# Patient Record
Sex: Female | Born: 1975
Health system: Southern US, Community
[De-identification: ages and names within clinical notes are randomized; demographics above are authoritative.]

## PROBLEM LIST (undated history)

## (undated) DIAGNOSIS — I10 Essential (primary) hypertension: Secondary | ICD-10-CM

## (undated) DIAGNOSIS — F419 Anxiety disorder, unspecified: Secondary | ICD-10-CM

## (undated) DIAGNOSIS — E079 Disorder of thyroid, unspecified: Secondary | ICD-10-CM

## (undated) DIAGNOSIS — T7840XA Allergy, unspecified, initial encounter: Secondary | ICD-10-CM

## (undated) DIAGNOSIS — Z923 Personal history of irradiation: Secondary | ICD-10-CM

## (undated) DIAGNOSIS — Z8489 Family history of other specified conditions: Secondary | ICD-10-CM

## (undated) DIAGNOSIS — E785 Hyperlipidemia, unspecified: Secondary | ICD-10-CM

## (undated) DIAGNOSIS — C50919 Malignant neoplasm of unspecified site of unspecified female breast: Secondary | ICD-10-CM

## (undated) HISTORY — DX: Disorder of thyroid, unspecified: E07.9

## (undated) HISTORY — DX: Personal history of irradiation: Z92.3

## (undated) HISTORY — DX: Allergy, unspecified, initial encounter: T78.40XA

## (undated) HISTORY — DX: Essential (primary) hypertension: I10

## (undated) HISTORY — DX: Hyperlipidemia, unspecified: E78.5

---

## 1997-09-20 ENCOUNTER — Emergency Department (HOSPITAL_COMMUNITY): Admission: EM | Admit: 1997-09-20 | Discharge: 1997-09-20 | Payer: Self-pay | Admitting: Emergency Medicine

## 1998-01-20 ENCOUNTER — Other Ambulatory Visit: Admission: RE | Admit: 1998-01-20 | Discharge: 1998-01-20 | Payer: Self-pay | Admitting: *Deleted

## 1998-03-17 ENCOUNTER — Emergency Department (HOSPITAL_COMMUNITY): Admission: EM | Admit: 1998-03-17 | Discharge: 1998-03-17 | Payer: Self-pay | Admitting: Emergency Medicine

## 1998-11-20 ENCOUNTER — Encounter: Payer: Self-pay | Admitting: Emergency Medicine

## 1998-11-20 ENCOUNTER — Emergency Department (HOSPITAL_COMMUNITY): Admission: EM | Admit: 1998-11-20 | Discharge: 1998-11-20 | Payer: Self-pay | Admitting: Emergency Medicine

## 1999-06-23 ENCOUNTER — Emergency Department (HOSPITAL_COMMUNITY): Admission: EM | Admit: 1999-06-23 | Discharge: 1999-06-23 | Payer: Self-pay | Admitting: Internal Medicine

## 1999-06-23 ENCOUNTER — Encounter: Payer: Self-pay | Admitting: Internal Medicine

## 1999-06-24 ENCOUNTER — Emergency Department (HOSPITAL_COMMUNITY): Admission: EM | Admit: 1999-06-24 | Discharge: 1999-06-24 | Payer: Self-pay | Admitting: Emergency Medicine

## 1999-07-01 ENCOUNTER — Emergency Department (HOSPITAL_COMMUNITY): Admission: EM | Admit: 1999-07-01 | Discharge: 1999-07-01 | Payer: Self-pay | Admitting: Internal Medicine

## 2000-06-20 ENCOUNTER — Other Ambulatory Visit: Admission: RE | Admit: 2000-06-20 | Discharge: 2000-06-20 | Payer: Self-pay | Admitting: Family Medicine

## 2000-09-30 ENCOUNTER — Emergency Department (HOSPITAL_COMMUNITY): Admission: EM | Admit: 2000-09-30 | Discharge: 2000-09-30 | Payer: Self-pay | Admitting: Emergency Medicine

## 2000-12-24 ENCOUNTER — Emergency Department (HOSPITAL_COMMUNITY): Admission: EM | Admit: 2000-12-24 | Discharge: 2000-12-25 | Payer: Self-pay | Admitting: Emergency Medicine

## 2001-01-04 ENCOUNTER — Emergency Department (HOSPITAL_COMMUNITY): Admission: EM | Admit: 2001-01-04 | Discharge: 2001-01-04 | Payer: Self-pay | Admitting: Emergency Medicine

## 2002-04-21 ENCOUNTER — Emergency Department (HOSPITAL_COMMUNITY): Admission: EM | Admit: 2002-04-21 | Discharge: 2002-04-21 | Payer: Self-pay | Admitting: Emergency Medicine

## 2002-07-06 ENCOUNTER — Emergency Department (HOSPITAL_COMMUNITY): Admission: EM | Admit: 2002-07-06 | Discharge: 2002-07-06 | Payer: Self-pay | Admitting: Emergency Medicine

## 2002-10-15 ENCOUNTER — Other Ambulatory Visit: Admission: RE | Admit: 2002-10-15 | Discharge: 2002-10-15 | Payer: Self-pay | Admitting: Family Medicine

## 2007-04-23 HISTORY — PX: EYE SURGERY: SHX253

## 2011-08-16 ENCOUNTER — Ambulatory Visit: Payer: Self-pay | Admitting: Family Medicine

## 2011-08-16 DIAGNOSIS — Z0289 Encounter for other administrative examinations: Secondary | ICD-10-CM

## 2011-10-08 ENCOUNTER — Emergency Department (HOSPITAL_COMMUNITY)
Admission: EM | Admit: 2011-10-08 | Discharge: 2011-10-08 | Discharge status: Against Medical Advice | Payer: No Typology Code available for payment source | Attending: Emergency Medicine | Admitting: Emergency Medicine

## 2011-10-08 ENCOUNTER — Encounter (HOSPITAL_COMMUNITY): Payer: Self-pay | Admitting: *Deleted

## 2011-10-08 DIAGNOSIS — Z043 Encounter for examination and observation following other accident: Secondary | ICD-10-CM | POA: Insufficient documentation

## 2011-10-08 NOTE — ED Notes (Signed)
Pt reported back to triage and was visiting a family member.
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
## 2011-10-08 NOTE — ED Notes (Signed)
Called for pt x 2 and no response in waiting room.

## 2011-10-08 NOTE — ED Notes (Signed)
Pt reports that she has to leave due to prior commitment and will come back later. Pt says "I feel fine" and will come back if necessary. Told pt that room was being cleaned at this time. She decided to leave.

## 2011-10-09 ENCOUNTER — Ambulatory Visit (INDEPENDENT_AMBULATORY_CARE_PROVIDER_SITE_OTHER): Payer: 59 | Admitting: Family Medicine

## 2011-10-09 VITALS — BP 133/85 | HR 92 | Temp 97.9°F | Resp 18 | Ht 62.5 in | Wt 167.0 lb

## 2011-10-09 DIAGNOSIS — M545 Low back pain, unspecified: Secondary | ICD-10-CM

## 2011-10-09 DIAGNOSIS — M62838 Other muscle spasm: Secondary | ICD-10-CM

## 2011-10-09 MED ORDER — IBUPROFEN 800 MG PO TABS
800.0000 mg | ORAL_TABLET | Freq: Three times a day (TID) | ORAL | Status: AC | PRN
Start: 2011-10-09 — End: 2011-10-19

## 2011-10-09 MED ORDER — CYCLOBENZAPRINE HCL 10 MG PO TABS: 10.0000 mg | ORAL_TABLET | Freq: Three times a day (TID) | ORAL | Status: AC | PRN

## 2011-10-09 NOTE — Patient Instructions (Addendum)
Take the muscle relaxer for relief.  Heat, massage and just general movement are the best things for the stiffness.  Know that the muscle relaxer (FLexeril) can make you sleepy.  Don't drive with this medicne.  Take the Ibuprofen three times a day for the first 4-5 days.  After that, take it when you need it.    If you're still having problem in 2 weeks come back and see [place].
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
## 2012-06-05 ENCOUNTER — Ambulatory Visit (INDEPENDENT_AMBULATORY_CARE_PROVIDER_SITE_OTHER): Payer: 59 | Admitting: Physician Assistant

## 2012-06-05 VITALS — BP 124/82 | HR 102 | Temp 98.4°F | Resp 17 | Ht 62.5 in | Wt 176.0 lb

## 2012-06-05 DIAGNOSIS — J019 Acute sinusitis, unspecified: Secondary | ICD-10-CM

## 2012-06-05 DIAGNOSIS — H6980 Other specified disorders of Eustachian tube, unspecified ear: Secondary | ICD-10-CM

## 2012-06-05 DIAGNOSIS — H6990 Unspecified Eustachian tube disorder, unspecified ear: Secondary | ICD-10-CM

## 2012-06-05 MED ORDER — AMOXICILLIN-POT CLAVULANATE 875-125 MG PO TABS: 1.0000 | ORAL_TABLET | Freq: Two times a day (BID) | ORAL | Status: DC

## 2012-06-05 MED ORDER — IPRATROPIUM BROMIDE 0.03 % NA SOLN: 2.0000 | Freq: Two times a day (BID) | NASAL | Status: DC

## 2012-06-05 NOTE — Progress Notes (Signed)
  Subjective:    Patient ID: Ann Thornton, female    DOB: 1975-10-06, 37 y.o.   MRN: [id number]  HPI 37 year old female presents with 10 days of sinus tenderness, nasal congestion, chills, and ear pressure/fullness.  States symptoms started as a "cold" and have progressively gotten worse.  Has been taking Mucinex which has helped some.  Does admit to sinus pressure and an aching, full pain in her ears.  Fevers have resolved but she does still have chills.  Denies abdominal pain, nausea, vomiting, sore throat, or dizziness.  She does have slight sinus headache relieved by ibuprofen.  She does not smoke. No history of asthma or other lung diseases.       Review of Systems  Constitutional: Positive for chills. Negative for fever.  HENT: Positive for ear pain (fullness/pressure), congestion, rhinorrhea and sinus pressure. Negative for sore throat and neck pain.   Respiratory: Negative for cough, shortness of breath and wheezing.   Gastrointestinal: Negative for nausea, vomiting and abdominal pain.  Neurological: Positive for headaches (sinus). Negative for dizziness and light-headedness.       Objective:   Physical Exam  Constitutional: She is oriented to person, place, and time. She appears well-developed and well-nourished.  HENT:  Head: Normocephalic and atraumatic.  Right Ear: Hearing, tympanic membrane, external ear and ear canal normal.  Left Ear: Hearing, tympanic membrane, external ear and ear canal normal.  Nose: Right sinus exhibits maxillary sinus tenderness. Right sinus exhibits no frontal sinus tenderness. Left sinus exhibits maxillary sinus tenderness. Left sinus exhibits no frontal sinus tenderness.  Mouth/Throat: Uvula is midline, oropharynx is clear and moist and mucous membranes are normal. No oropharyngeal exudate.  Eyes: Conjunctivae are normal.  Neck: Normal range of motion. Neck supple.  Cardiovascular: Normal rate, regular rhythm and normal heart sounds.    Pulmonary/Chest: Effort normal and breath sounds normal.  Lymphadenopathy:    She has no cervical adenopathy.  Neurological: She is alert and oriented to person, place, and time.  Psychiatric: She has a normal mood and affect. Her behavior is normal. Judgment and thought content normal.          Assessment & Plan:  1. Sinusitis, acute - Plan: amoxicillin-clavulanate (AUGMENTIN) 875-125 MG per tablet, ipratropium (ATROVENT) 0.03 % nasal spray  -Augmentin 875 mg bid x 10 days  -Atrovent NS bid  -Continue Mucinex as directed. May use Sudafed x 2-3 days  -Follow up if symptoms worsen or fail to improve.  2. ETD (eustachian tube dysfunction)  -Zyrtec daily in the a.m to help with ETD

## 2012-06-20 ENCOUNTER — Ambulatory Visit (INDEPENDENT_AMBULATORY_CARE_PROVIDER_SITE_OTHER): Payer: 59 | Admitting: Family Medicine

## 2012-06-20 VITALS — BP 128/79 | HR 90 | Temp 98.2°F | Resp 18 | Ht 64.0 in | Wt 175.0 lb

## 2012-06-20 DIAGNOSIS — R05 Cough: Secondary | ICD-10-CM

## 2012-06-20 DIAGNOSIS — R059 Cough, unspecified: Secondary | ICD-10-CM

## 2012-06-20 MED ORDER — AZITHROMYCIN 250 MG PO TABS: ORAL_TABLET | ORAL | Status: DC

## 2012-06-20 MED ORDER — HYDROCODONE-HOMATROPINE 5-1.5 MG/5ML PO SYRP: 5.0000 mL | ORAL_SOLUTION | Freq: Three times a day (TID) | ORAL | Status: DC | PRN

## 2012-06-20 NOTE — Progress Notes (Signed)
[facility] [address] [place] [address] [id number]  Date:  06/20/2012   Name:  Ann Thornton   DOB:  [birth year]   MRN:  [id number]  PCP:  No primary provider on file.    Chief Complaint: Cough   History of Present Illness:  Ann Thornton is a 37 y.o. very pleasant female patient who presents with the following:  Here about 2 weeks ago with 10 days of sinus tenderness- treated with augmentin, atrovent NS, mucinex and zyrtec.   Her sinuses have gotten much better, but she now has a bad cough which is even worse at night.  The cough can be productive of brown colored mucus. She has noted some body aches, but thinks this is due to coughing so much No fever or chills She has had some post- tussive emesis but no other emesis.   She is generally healthy She does feel like she has been wheezing some  She has tried some "mucinex chest and cough", and some robitussin OTC, and nyquil at night She has her menses right now She is otherwise generally healthy  There is no problem list on file for this patient.   Past Medical History  Diagnosis Date  . Allergy     Past Surgical History  Procedure Laterality Date  . Eye surgery      History  Substance Use Topics  . Smoking status: Never Smoker   . Smokeless tobacco: Not on file  . Alcohol Use: Yes     Comment: on weekends    No family history on file.  No Known Allergies  Medication list has been reviewed and updated.  Current Outpatient Prescriptions on File Prior to Visit  Medication Sig Dispense Refill  . pseudoephedrine-guaifenesin (MUCINEX D) 60-600 MG per tablet Take 1 tablet by mouth every 12 (twelve) hours.      Marland Kitchen amoxicillin-clavulanate (AUGMENTIN) 875-125 MG per tablet Take 1 tablet by mouth 2 (two) times daily.  20 tablet  0  . ipratropium (ATROVENT) 0.03 % nasal spray Place 2 sprays into the nose 2 (two) times daily.  30 mL  1   No current facility-administered medications on  file prior to visit.    Review of Systems:  As per HPI- otherwise negative.   Physical Examination: Filed Vitals:   06/20/12 1400  BP: 128/79  Pulse: 109  Temp: 98.2 F (36.8 C)  Resp: 18   Filed Vitals:   06/20/12 1400  Height: 5\' 4"  (1.626 m)  Weight: 175 lb (79.379 kg)   Body mass index is 30.02 kg/(m^2). Ideal Body Weight: Weight in (lb) to have BMI = 25: 145.3  GEN: WDWN, NAD, Non-toxic, A & O x 3 HEENT: Atraumatic, Normocephalic. Neck supple. No masses, No LAD. Bilateral TM wnl, oropharynx normal.  PEERL,EOMI.   Ears and Nose: No external deformity. CV: RRR, No M/G/R. No JVD. No thrill. No extra heart sounds. PULM: CTA B, no wheezes, crackles, rhonchi. No retractions. No resp. distress. No accessory muscle use. EXTR: No c/c/e NEURO Normal gait.  PSYCH: Normally interactive. Conversant. Not depressed or anxious appearing.  Calm demeanor.    Assessment and Plan: Cough - Plan: azithromycin (ZITHROMAX) 250 MG tablet, HYDROcodone-homatropine (HYCODAN) 5-1.5 MG/5ML syrup  Treat for cough with azithromycin and hycodan PRN See patient instructions for more details.     Abbe Amsterdam, MD

## 2012-06-20 NOTE — Patient Instructions (Addendum)
Use the antibiotic as directed, and the cough syrup as needed.  Remember the cough syrup can cause drowsiness.    If you are not better in the next few days please let us know- Sooner if worse.   Buy some OTC probiotic supplement because we are using 2 different antibiotics in close succession

## 2012-10-12 ENCOUNTER — Ambulatory Visit: Payer: Self-pay | Admitting: Dietician

## 2012-10-22 ENCOUNTER — Ambulatory Visit: Payer: Self-pay | Admitting: *Deleted

## 2013-09-20 LAB — HM PAP SMEAR: HM Pap smear: NORMAL

## 2014-03-29 ENCOUNTER — Ambulatory Visit (INDEPENDENT_AMBULATORY_CARE_PROVIDER_SITE_OTHER): Payer: 59 | Admitting: Family Medicine

## 2014-03-29 VITALS — BP 146/84 | HR 102 | Temp 98.2°F | Resp 18 | Ht 63.0 in | Wt 173.8 lb

## 2014-03-29 DIAGNOSIS — R002 Palpitations: Secondary | ICD-10-CM

## 2014-03-29 DIAGNOSIS — R000 Tachycardia, unspecified: Secondary | ICD-10-CM

## 2014-03-29 DIAGNOSIS — IMO0001 Reserved for inherently not codable concepts without codable children: Secondary | ICD-10-CM

## 2014-03-29 DIAGNOSIS — R030 Elevated blood-pressure reading, without diagnosis of hypertension: Secondary | ICD-10-CM

## 2014-03-29 LAB — COMPLETE METABOLIC PANEL WITH GFR
ALT: 11 U/L (ref 0–35)
AST: 14 U/L (ref 0–37)
Albumin: 4.5 g/dL (ref 3.5–5.2)
Alkaline Phosphatase: 52 U/L (ref 39–117)
BILIRUBIN TOTAL: 0.6 mg/dL (ref 0.2–1.2)
BUN: 10 mg/dL (ref 6–23)
CALCIUM: 10.2 mg/dL (ref 8.4–10.5)
CHLORIDE: 101 meq/L (ref 96–112)
CO2: 25 mEq/L (ref 19–32)
Creat: 0.75 mg/dL (ref 0.50–1.10)
GFR, Est African American: >89 mL/min
GFR, Est Non African American: >89 mL/min
Glucose, Bld: 111 mg/dL — ABNORMAL HIGH (ref 70–99)
Potassium: 4 mEq/L (ref 3.5–5.3)
Sodium: 135 mEq/L (ref 135–145)
Total Protein: 8.6 g/dL — ABNORMAL HIGH (ref 6.0–8.3)

## 2014-03-29 LAB — POCT CBC
Granulocyte percent: 47.7 %G (ref 37–80)
HEMATOCRIT: 38 % (ref 37.7–47.9)
HEMOGLOBIN: 12.5 g/dL (ref 12.2–16.2)
LYMPH, POC: 3.2 (ref 0.6–3.4)
MCH, POC: 30.8 pg (ref 27–31.2)
MCHC: 32.9 g/dL (ref 31.8–35.4)
MCV: 39.6 fL — AB (ref 80–97)
MID (cbc): 1 — AB (ref 0–0.9)
MPV: 8.6 fL (ref 0–99.8)
POC GRANULOCYTE: 3.9 (ref 2–6.9)
POC LYMPH PERCENT: 39.6 %L (ref 10–50)
POC MID %: 12.7 %M — AB (ref 0–12)
Platelet Count, POC: 324 10*3/uL (ref 142–424)
RBC: 4.06 M/uL (ref 4.04–5.48)
RDW, POC: 12.1 %
WBC: 8.1 10*3/uL (ref 4.6–10.2)

## 2014-03-29 LAB — T4, FREE: FREE T4: 1.08 ng/dL (ref 0.80–1.80)

## 2014-03-29 LAB — T3, FREE: T3, Free: 3.3 pg/mL (ref 2.3–4.2)

## 2014-03-29 LAB — TSH: TSH: 3.089 u[IU]/mL (ref 0.350–4.500)

## 2014-03-29 NOTE — Patient Instructions (Addendum)
Watch your salt intake to help with your blood pressure.  I want to see you back in a month to recheck your blood pressure sooner if the heart racing does not get better.    You will work on deep breathing and trying to get these episodes (anxiety attacks) to stop  I will contact you with your lab results as soon as they are available.   If you have not heard from me in 2 weeks, please contact me.  The fastest way to get your results is to register for My Chart (see the instructions on the last page of this printout).
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
## 2014-05-03 NOTE — Progress Notes (Signed)
Reviewed documentation and EKG and agree w/ assessment and plan. Delman Cheadle, MD MPH

## 2014-06-02 ENCOUNTER — Ambulatory Visit (INDEPENDENT_AMBULATORY_CARE_PROVIDER_SITE_OTHER): Payer: 59 | Admitting: Physician Assistant

## 2014-06-02 VITALS — BP 140/92 | HR 95 | Temp 98.8°F | Resp 18 | Ht 63.0 in | Wt 180.8 lb

## 2014-06-02 DIAGNOSIS — G4700 Insomnia, unspecified: Secondary | ICD-10-CM

## 2014-06-02 DIAGNOSIS — F410 Panic disorder [episodic paroxysmal anxiety] without agoraphobia: Secondary | ICD-10-CM

## 2014-06-02 DIAGNOSIS — F419 Anxiety disorder, unspecified: Secondary | ICD-10-CM

## 2014-06-02 MED ORDER — CLONAZEPAM 0.5 MG PO TABS: 0.2500 mg | ORAL_TABLET | Freq: Two times a day (BID) | ORAL | Status: DC | PRN

## 2014-06-02 MED ORDER — ESCITALOPRAM OXALATE 10 MG PO TABS: 10.0000 mg | ORAL_TABLET | Freq: Every day | ORAL | Status: DC

## 2014-06-02 NOTE — Progress Notes (Signed)
   Subjective:    Patient ID: Ann Thornton, female    DOB: [birth year], 39 y.o.   MRN: [id number]  HPI Pt presents to clinic with continued palpitations and anxiety.  She is in a better place with accepting of her grandmother's disease process and it still makes her upset but not as bad as it was 2 months ago.  She is having problems daily with generalized anxiety and then she has about 2 panic attacks a day - 3x/week they wake her from sleep.  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  She is having no chest pain and she only has SOB when she is having a panic attack.  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  Her sleep is ok but she is having trouble getting to sleep.  She has noticed over the last several weeks she has increased her ETOH use at night to help with her sleeping.  She is up to 3 liquor shots each night.  She does not smoke and has decreased her use of marijuana because that increases her anxiety.  She has accepted the diagnosis of anxiety and now she really wants treatment for her symptoms.  Review of Systems  Cardiovascular: Positive for palpitations. Negative for chest pain.  Psychiatric/Behavioral: Positive for sleep disturbance. The patient is nervous/anxious.        Objective:   Physical Exam  Constitutional: She is oriented to person, place, and time. She appears well-developed and well-nourished.  BP 140/92 mmHg  Pulse 95  Temp(Src) 98.8 F (37.1 C) (Oral)  Resp 18  Ht 5\' 3"  (1.6 m)  Wt 180 lb 12.8 oz (82.01 kg)  BMI 32.04 kg/m2  SpO2 99%  LMP 06/02/2014   HENT:  Head: Normocephalic and atraumatic.  Right Ear: External ear normal.  Left Ear: External ear normal.  Pulmonary/Chest: Effort normal.  Neurological: She  is alert and oriented to person, place, and time.  Skin: Skin is warm and dry.  Psychiatric: She has a normal mood and affect. Her behavior is normal. Judgment and thought content normal.       Assessment & Plan:  Panic attacks - Plan: clonazePAM (KLONOPIN) 0.5 MG tablet  Insomnia - Plan: clonazePAM (KLONOPIN) 0.5 MG tablet  Anxiety - Plan: escitalopram (LEXAPRO) 10 MG tablet  We are going to start Lexapro and we discussed what to expect and when to expect it in regards to possible SE and expectations.  Due to her daily panic attacks we are going to start Klonopin bid for now and then we will decrease to prn in 2-3 days. She will decrease her EOTH intake and she will recheck with me in 1 month.  Windell Hummingbird PA-C  [facility] 06/02/2014 2:50 PM

## 2014-06-02 NOTE — Patient Instructions (Addendum)
See me in 1 month  [facility] Policy for Prescribing Controlled Substances (Revised 02/2012) 1. Prescriptions for controlled substances will be filled by ONE provider at [facility] with whom you have established and developed a plan for your care, including follow-up. 2. You are encouraged to schedule an appointment with your prescriber at our appointment center for follow-up visits whenever possible. 3. If you request a prescription for the controlled substance while at [facility] for an acute problem (with someone other than your regular prescriber), you MAY be given a ONE-TIME prescription for a 30-day supply of the controlled substance, to allow time for you to return to see your regular prescriber for additional prescriptions.  Sign up for my chart so you can contact me

## 2014-06-05 ENCOUNTER — Encounter: Payer: Self-pay | Admitting: Physician Assistant

## 2015-01-24 ENCOUNTER — Encounter (HOSPITAL_COMMUNITY): Payer: Self-pay | Admitting: Emergency Medicine

## 2015-01-24 ENCOUNTER — Emergency Department (HOSPITAL_COMMUNITY): Payer: No Typology Code available for payment source

## 2015-01-24 ENCOUNTER — Emergency Department (HOSPITAL_COMMUNITY)
Admission: EM | Admit: 2015-01-24 | Discharge: 2015-01-24 | Disposition: A | Discharge status: Home / Self Care | Payer: No Typology Code available for payment source | Attending: Emergency Medicine | Admitting: Emergency Medicine

## 2015-01-24 DIAGNOSIS — Y9389 Activity, other specified: Secondary | ICD-10-CM | POA: Insufficient documentation

## 2015-01-24 DIAGNOSIS — S60811A Abrasion of right wrist, initial encounter: Secondary | ICD-10-CM | POA: Diagnosis not present

## 2015-01-24 DIAGNOSIS — Z791 Long term (current) use of non-steroidal anti-inflammatories (NSAID): Secondary | ICD-10-CM | POA: Insufficient documentation

## 2015-01-24 DIAGNOSIS — T2112XA Burn of first degree of abdominal wall, initial encounter: Secondary | ICD-10-CM

## 2015-01-24 DIAGNOSIS — Z23 Encounter for immunization: Secondary | ICD-10-CM | POA: Diagnosis not present

## 2015-01-24 DIAGNOSIS — Y998 Other external cause status: Secondary | ICD-10-CM | POA: Insufficient documentation

## 2015-01-24 DIAGNOSIS — S63501A Unspecified sprain of right wrist, initial encounter: Secondary | ICD-10-CM | POA: Insufficient documentation

## 2015-01-24 DIAGNOSIS — X19XXXA Contact with other heat and hot substances, initial encounter: Secondary | ICD-10-CM | POA: Diagnosis not present

## 2015-01-24 DIAGNOSIS — Z3202 Encounter for pregnancy test, result negative: Secondary | ICD-10-CM | POA: Diagnosis not present

## 2015-01-24 DIAGNOSIS — Z79899 Other long term (current) drug therapy: Secondary | ICD-10-CM | POA: Insufficient documentation

## 2015-01-24 DIAGNOSIS — T2122XA Burn of second degree of abdominal wall, initial encounter: Secondary | ICD-10-CM | POA: Diagnosis not present

## 2015-01-24 DIAGNOSIS — S6991XA Unspecified injury of right wrist, hand and finger(s), initial encounter: Secondary | ICD-10-CM | POA: Diagnosis present

## 2015-01-24 DIAGNOSIS — Y92410 Unspecified street and highway as the place of occurrence of the external cause: Secondary | ICD-10-CM | POA: Insufficient documentation

## 2015-01-24 LAB — POC URINE PREG, ED: Preg Test, Ur: NEGATIVE

## 2015-01-24 MED ORDER — METHOCARBAMOL 500 MG PO TABS: 500.0000 mg | ORAL_TABLET | Freq: Two times a day (BID) | ORAL | Status: DC

## 2015-01-24 MED ORDER — BACITRACIN ZINC 500 UNIT/GM EX OINT: 1.0000 "application " | TOPICAL_OINTMENT | Freq: Two times a day (BID) | CUTANEOUS | Status: DC

## 2015-01-24 MED ORDER — BACITRACIN ZINC 500 UNIT/GM EX OINT
TOPICAL_OINTMENT | CUTANEOUS | Status: AC
  Administered 2015-01-24: 23:00:00
  Filled 2015-01-24: qty 0.9

## 2015-01-24 MED ORDER — NAPROXEN 500 MG PO TABS: 500.0000 mg | ORAL_TABLET | Freq: Two times a day (BID) | ORAL | Status: DC

## 2015-01-24 MED ORDER — TETANUS-DIPHTH-ACELL PERTUSSIS 5-2.5-18.5 LF-MCG/0.5 IM SUSP
0.5000 mL | Freq: Once | INTRAMUSCULAR | Status: AC
  Administered 2015-01-24: 0.5 mL via INTRAMUSCULAR
  Filled 2015-01-24: qty 0.5
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
## 2015-01-30 ENCOUNTER — Other Ambulatory Visit: Payer: Self-pay | Admitting: Internal Medicine

## 2015-01-31 NOTE — Telephone Encounter (Signed)
I gave her #5 because she was supposed to f/u with me in the spring.

## 2015-01-31 NOTE — Telephone Encounter (Signed)
Faxed RF and notified pt of #5 RF and need for f/up (on VM).

## 2015-03-23 ENCOUNTER — Ambulatory Visit (INDEPENDENT_AMBULATORY_CARE_PROVIDER_SITE_OTHER): Payer: 59 | Admitting: Physician Assistant

## 2015-03-23 VITALS — BP 180/100 | HR 98 | Temp 98.4°F | Resp 16 | Ht 63.5 in | Wt 198.6 lb

## 2015-03-23 DIAGNOSIS — F419 Anxiety disorder, unspecified: Secondary | ICD-10-CM | POA: Diagnosis not present

## 2015-03-23 DIAGNOSIS — R030 Elevated blood-pressure reading, without diagnosis of hypertension: Secondary | ICD-10-CM

## 2015-03-23 DIAGNOSIS — IMO0001 Reserved for inherently not codable concepts without codable children: Secondary | ICD-10-CM

## 2015-03-23 DIAGNOSIS — G4700 Insomnia, unspecified: Secondary | ICD-10-CM

## 2015-03-23 DIAGNOSIS — I10 Essential (primary) hypertension: Secondary | ICD-10-CM | POA: Diagnosis not present

## 2015-03-23 MED ORDER — TRAZODONE HCL 50 MG PO TABS: 25.0000 mg | ORAL_TABLET | Freq: Every evening | ORAL | Status: DC | PRN

## 2015-03-23 MED ORDER — AMLODIPINE BESYLATE 10 MG PO TABS: 10.0000 mg | ORAL_TABLET | Freq: Every day | ORAL | Status: DC

## 2015-03-23 MED ORDER — LOSARTAN POTASSIUM-HCTZ 50-12.5 MG PO TABS: 1.0000 | ORAL_TABLET | Freq: Every day | ORAL | Status: DC

## 2015-03-23 MED ORDER — CLONAZEPAM 0.5 MG PO TABS: 0.5000 mg | ORAL_TABLET | Freq: Every day | ORAL | Status: DC | PRN
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
## 2015-03-23 NOTE — Patient Instructions (Signed)
For the trazodone  - start with 1/2 pill for the 1st 4 days - please increase the dosage by 1/2 pill every 4-5 days until you sleep well, reach 150mg  (that is 3 pills) or get side effects.  Let me know which dose works well for you and your refill will be at that dose.

## 2015-03-24 LAB — COMPLETE METABOLIC PANEL WITH GFR
ALBUMIN: 4.4 g/dL (ref 3.6–5.1)
ALK PHOS: 57 U/L (ref 33–115)
ALT: 17 U/L (ref 6–29)
AST: 22 U/L (ref 10–30)
BUN: 9 mg/dL (ref 7–25)
CHLORIDE: 99 mmol/L (ref 98–110)
CO2: 28 mmol/L (ref 20–31)
Calcium: 9.8 mg/dL (ref 8.6–10.2)
Creat: 0.67 mg/dL (ref 0.50–1.10)
GFR, Est African American: >89 mL/min (ref >=60)
GLUCOSE: 102 mg/dL — AB (ref 65–99)
POTASSIUM: 3.6 mmol/L (ref 3.5–5.3)
SODIUM: 136 mmol/L (ref 135–146)
Total Bilirubin: 0.4 mg/dL (ref 0.2–1.2)
Total Protein: 8.2 g/dL — ABNORMAL HIGH (ref 6.1–8.1)

## 2015-03-24 LAB — TSH: TSH: 8.175 u[IU]/mL — ABNORMAL HIGH (ref 0.350–4.500)

## 2015-03-28 ENCOUNTER — Encounter: Payer: Self-pay | Admitting: Physician Assistant

## 2015-04-08 ENCOUNTER — Ambulatory Visit (INDEPENDENT_AMBULATORY_CARE_PROVIDER_SITE_OTHER): Payer: 59 | Admitting: Physician Assistant

## 2015-04-08 VITALS — BP 132/82 | HR 116 | Temp 98.6°F | Resp 17 | Ht 63.0 in | Wt 191.0 lb

## 2015-04-08 DIAGNOSIS — I10 Essential (primary) hypertension: Secondary | ICD-10-CM | POA: Diagnosis not present

## 2015-04-08 DIAGNOSIS — G4700 Insomnia, unspecified: Secondary | ICD-10-CM | POA: Diagnosis not present

## 2015-04-08 DIAGNOSIS — R7989 Other specified abnormal findings of blood chemistry: Secondary | ICD-10-CM

## 2015-04-08 LAB — T4, FREE: FREE T4: 0.98 ng/dL (ref 0.80–1.80)

## 2015-04-08 LAB — TSH: TSH: 1.429 u[IU]/mL (ref 0.350–4.500)

## 2015-04-08 MED ORDER — AMLODIPINE BESYLATE 10 MG PO TABS: 10.0000 mg | ORAL_TABLET | Freq: Every day | ORAL | Status: DC

## 2015-04-08 MED ORDER — TRAZODONE HCL 50 MG PO TABS: 25.0000 mg | ORAL_TABLET | Freq: Every evening | ORAL | Status: DC | PRN

## 2015-04-08 NOTE — Progress Notes (Signed)
   Ann Thornton  MRN: [id number] DOB: 07-18-1975  Subjective:  Pt presents to clinic for recheck of her BP and recheck of TSH.  She has felt much better since starting the HTN medication.  She is tolerating the medication ok and she has been checking her BP at home and it is much better.  She has also been using the trazodone at night and that is helping her sleep - she is only able to take 25mg  on nights that she works but on the weekends she will take 50mg .  Patient Active Problem List   Diagnosis Date Noted  . Essential hypertension 03/23/2015  . Anxiety disorder 06/02/2014  . Panic attacks 06/02/2014    Current Outpatient Prescriptions on File Prior to Visit  Medication Sig Dispense Refill  . clonazePAM (KLONOPIN) 0.5 MG tablet Take 1 tablet (0.5 mg total) by mouth daily as needed for anxiety. 30 tablet 0   No current facility-administered medications on file prior to visit.    No Known Allergies  Review of Systems  Constitutional: Negative for fever and chills.  Respiratory: Negative for shortness of breath.   Cardiovascular: Negative for chest pain, palpitations and leg swelling.   Objective:  BP 132/82 mmHg  Pulse 116  Temp(Src) 98.6 F (37 C) (Oral)  Resp 17  Ht 5\' 3"  (1.6 m)  Wt 191 lb (86.637 kg)  BMI 33.84 kg/m2  SpO2 97%  LMP 03/04/2015  Physical Exam  Constitutional: She is oriented to person, place, and time and well-developed, well-nourished, and in no distress.  HENT:  Head: Normocephalic and atraumatic.  Right Ear: Hearing and external ear normal.  Left Ear: Hearing and external ear normal.  Eyes: Conjunctivae are normal.  Neck: Normal range of motion. No thyroid mass and no thyromegaly present.  Cardiovascular: Normal rate, regular rhythm and normal heart sounds.   No murmur heard. Pulmonary/Chest: Effort normal and breath sounds normal.  Musculoskeletal:       Right lower leg: She exhibits no edema.       Left lower leg: She exhibits no edema.   Neurological: She is alert and oriented to person, place, and time. Gait normal.  Skin: Skin is warm and dry.  Psychiatric: Mood, memory, affect and judgment normal.  Vitals reviewed.   Assessment and Plan :  Abnormal TSH - Plan: TSH, T4, Free  Essential hypertension - Plan: amLODipine (NORVASC) 10 MG tablet, Care order/instruction - controlled on medication  Insomnia - Plan: traZODone (DESYREL) 50 MG tablet   Continue current medication - recheck labs today and treat according to the results.  F/u in 3 months (maybe be 2  Months if start on thyroid medication)  Windell Hummingbird PA-C  [facility] 04/08/2015 10:35 AM

## 2015-04-08 NOTE — Patient Instructions (Signed)
We will check your labs and then I will contact you once the results are available.

## 2015-06-19 ENCOUNTER — Other Ambulatory Visit: Payer: Self-pay

## 2015-06-19 DIAGNOSIS — G4700 Insomnia, unspecified: Secondary | ICD-10-CM

## 2015-06-19 DIAGNOSIS — I10 Essential (primary) hypertension: Secondary | ICD-10-CM

## 2015-06-19 MED ORDER — AMLODIPINE BESYLATE 10 MG PO TABS: 10.0000 mg | ORAL_TABLET | Freq: Every day | ORAL | Status: DC

## 2015-06-19 MED ORDER — TRAZODONE HCL 50 MG PO TABS: 25.0000 mg | ORAL_TABLET | Freq: Every evening | ORAL | Status: DC | PRN

## 2015-06-25 ENCOUNTER — Encounter: Payer: Self-pay | Admitting: Physician Assistant

## 2015-06-26 ENCOUNTER — Other Ambulatory Visit: Payer: Self-pay | Admitting: Family Medicine

## 2015-08-02 ENCOUNTER — Other Ambulatory Visit: Payer: Self-pay | Admitting: Physician Assistant

## 2015-08-21 ENCOUNTER — Other Ambulatory Visit: Payer: Self-pay | Admitting: Family Medicine

## 2015-08-22 NOTE — Telephone Encounter (Signed)
Needs office visit for refills. 1 more month called in

## 2015-11-09 ENCOUNTER — Encounter: Payer: 59 | Admitting: Nurse Practitioner

## 2015-11-09 ENCOUNTER — Encounter: Payer: Self-pay | Admitting: Nurse Practitioner

## 2015-11-14 ENCOUNTER — Other Ambulatory Visit: Payer: Self-pay | Admitting: Family Medicine

## 2015-11-14 DIAGNOSIS — F419 Anxiety disorder, unspecified: Secondary | ICD-10-CM

## 2015-11-16 NOTE — Telephone Encounter (Signed)
Patient is due for a follow up for refills?  What is his plan?

## 2015-11-18 NOTE — Progress Notes (Signed)
This encounter was created in error - please disregard.

## 2015-11-20 NOTE — Telephone Encounter (Signed)
Spoke to pt who stated that she would like to get in but has a balance she is trying to pay off. She wondered if she can make an appt while paying it off and stated she can make a payment today. I put her through to Billing and advised I will send in a 30 day supply of her meds to give her time to get in.

## 2015-11-20 NOTE — Telephone Encounter (Signed)
Pt called me back and stated that she should be able to pay off the remaining $100 balance next week. She will plan to come in for f/up then in Aug with in the month. Pt was wondering if she could possibly also get a RF of clonazepam to hold her until then? She reported that she only takes these as needed and has been taking about 2 per week usually. She only has 3 tabs left and would like a few more so she will not be out before she can get in. Sarah, please advise.

## 2015-11-20 NOTE — Addendum Note (Signed)
Addended by: Elwyn Reach A on: 11/20/2015 10:55 AM   Modules accepted: Orders

## 2015-11-23 MED ORDER — CLONAZEPAM 0.5 MG PO TABS: 0.5000 mg | ORAL_TABLET | Freq: Every day | ORAL | 0 refills | Status: DC | PRN

## 2015-11-23 NOTE — Addendum Note (Signed)
Addended by: Mancel Bale on: 11/23/2015 03:34 PM   Modules accepted: Orders

## 2015-11-23 NOTE — Telephone Encounter (Signed)
Done

## 2015-11-23 NOTE — Telephone Encounter (Signed)
Verified w/pt the correct pharm and called in to [facility].

## 2015-11-24 ENCOUNTER — Encounter (HOSPITAL_COMMUNITY): Payer: Self-pay | Admitting: Emergency Medicine

## 2015-11-24 ENCOUNTER — Emergency Department (HOSPITAL_COMMUNITY)
Admission: EM | Admit: 2015-11-24 | Discharge: 2015-11-24 | Disposition: A | Discharge status: Home / Self Care | Payer: 59 | Attending: Emergency Medicine | Admitting: Emergency Medicine

## 2015-11-24 DIAGNOSIS — E860 Dehydration: Secondary | ICD-10-CM | POA: Insufficient documentation

## 2015-11-24 DIAGNOSIS — R002 Palpitations: Secondary | ICD-10-CM | POA: Insufficient documentation

## 2015-11-24 DIAGNOSIS — E876 Hypokalemia: Secondary | ICD-10-CM

## 2015-11-24 DIAGNOSIS — Z79899 Other long term (current) drug therapy: Secondary | ICD-10-CM | POA: Insufficient documentation

## 2015-11-24 DIAGNOSIS — R531 Weakness: Secondary | ICD-10-CM | POA: Diagnosis present

## 2015-11-24 DIAGNOSIS — I10 Essential (primary) hypertension: Secondary | ICD-10-CM | POA: Diagnosis not present

## 2015-11-24 HISTORY — DX: Anxiety disorder, unspecified: F41.9

## 2015-11-24 LAB — URINALYSIS, ROUTINE W REFLEX MICROSCOPIC
Bilirubin Urine: NEGATIVE
GLUCOSE, UA: NEGATIVE mg/dL
Ketones, ur: 15 mg/dL — AB
Leukocytes, UA: NEGATIVE
Nitrite: NEGATIVE
Protein, ur: NEGATIVE mg/dL
SPECIFIC GRAVITY, URINE: 1.007 (ref 1.005–1.030)
pH: 6 (ref 5.0–8.0)

## 2015-11-24 LAB — I-STAT TROPONIN, ED: TROPONIN I, POC: 0.01 ng/mL (ref 0.00–0.08)

## 2015-11-24 LAB — CBC
HCT: 36.7 % (ref 36.0–46.0)
HEMOGLOBIN: 12.7 g/dL (ref 12.0–15.0)
MCH: 31.4 pg (ref 26.0–34.0)
MCHC: 34.6 g/dL (ref 30.0–36.0)
MCV: 90.8 fL (ref 78.0–100.0)
Platelets: 394 10*3/uL (ref 150–400)
RBC: 4.04 MIL/uL (ref 3.87–5.11)
RDW: 12 % (ref 11.5–15.5)
WBC: 7.4 10*3/uL (ref 4.0–10.5)

## 2015-11-24 LAB — I-STAT BETA HCG BLOOD, ED (MC, WL, AP ONLY)

## 2015-11-24 LAB — BASIC METABOLIC PANEL
ANION GAP: 11 (ref 5–15)
BUN: 10 mg/dL (ref 6–20)
CHLORIDE: 98 mmol/L — AB (ref 101–111)
CO2: 25 mmol/L (ref 22–32)
Calcium: 9.5 mg/dL (ref 8.9–10.3)
Creatinine, Ser: 0.53 mg/dL (ref 0.44–1.00)
GFR calc non Af Amer: >60 mL/min (ref >60)
Glucose, Bld: 116 mg/dL — ABNORMAL HIGH (ref 65–99)
POTASSIUM: 3 mmol/L — AB (ref 3.5–5.1)
Sodium: 134 mmol/L — ABNORMAL LOW (ref 135–145)

## 2015-11-24 LAB — URINE MICROSCOPIC-ADD ON

## 2015-11-24 MED ORDER — SODIUM CHLORIDE 0.9 % IV BOLUS (SEPSIS)
1000.0000 mL | Freq: Once | INTRAVENOUS | Status: AC
  Administered 2015-11-24: 1000 mL via INTRAVENOUS

## 2015-11-24 MED ORDER — POTASSIUM CHLORIDE CRYS ER 20 MEQ PO TBCR
40.0000 meq | EXTENDED_RELEASE_TABLET | Freq: Once | ORAL | Status: AC
  Administered 2015-11-24: 40 meq via ORAL
  Filled 2015-11-24: qty 2

## 2015-11-24 MED ORDER — POTASSIUM CHLORIDE ER 10 MEQ PO TBCR: 10.0000 meq | EXTENDED_RELEASE_TABLET | Freq: Two times a day (BID) | ORAL | 0 refills | Status: DC

## 2015-11-24 NOTE — ED Triage Notes (Addendum)
Pt states on Wednesday she began feeling gradual onset nausea without nausea. Pt states this comes and goes. Pt states that when she is not wearing her glasses and looking at her multiple computer screens (she works from home) she sees black spots. Pt states that on Wed she began having CP, but is not currently experiencing this. Pt is tachycardic at time of assessment, but states she has a hx of anxiety and she feels anxious. Pt states she is feeling "uneasy and weak". Pt takes klonopin prn for anxiety. Pt states she last took it on Wed.

## 2015-11-24 NOTE — ED Provider Notes (Signed)
[facility] DEPT Provider Note   CSN: [id number] Arrival date & time: 11/24/15  1647  First Provider Contact:  None       History   Chief Complaint Chief Complaint  Patient presents with  . seeing black spots  . Tachycardia  . Weakness    HPI Ann Thornton Lanny Hurst is a 40 y.o. female.  HPI   Chest was hurting today, like heart racing/palpitations. Have anxiety, take klonopin. Last one was on Wednesday, and didn't feel well and came over here.  Seeing black spots sometimes when looking at computer and working, more often then rest of the week. Floating around in both eyes, worse in right eye. Looking from screen to screen at work.  Not missing an area in field of vision. No pain.    Now feeling much better.  Not having palpitations any more.  No visual changes.  Palpitations come and go, happened around 130PM, then felt better then 330 came back and came ot ED. Felt discomfort like palpitations in chest but no chest pain. Had some sensation in shoulder with palpitations as well. No shortness of breath at the time.  No leg pain or swelling. No hx of DVT/PE, not on birth control, no recent surgeries. No long trips.    Past Medical History:  Diagnosis Date  . Allergy   . Anxiety   . High blood pressure     Patient Active Problem List   Diagnosis Date Noted  . Insomnia 04/08/2015  . Essential hypertension 03/23/2015  . Anxiety disorder 06/02/2014  . Panic attacks 06/02/2014    Past Surgical History:  Procedure Laterality Date  . EYE SURGERY Right 2009   Tightened muscle of eye    OB History    No data available       Home Medications    Prior to Admission medications   Medication Sig Start Date End Date Taking? Authorizing Provider  amLODipine (NORVASC) 10 MG tablet Take 1 tablet by mouth  daily Patient taking differently: Take 10 mg by mouth daily 11/20/15  Yes Wardell Honour, MD  clonazePAM (KLONOPIN) 0.5 MG tablet Take 1 tablet (0.5 mg total) by mouth daily as  needed for anxiety. 11/23/15  Yes Mancel Bale, PA-C  naproxen sodium (ANAPROX) 220 MG tablet Take 440 mg by mouth every 12 (twelve) hours as needed (pain).   Yes Historical Provider, MD  potassium chloride (K-DUR) 10 MEQ tablet Take 1 tablet (10 mEq total) by mouth 2 (two) times daily. 11/24/15 11/26/15  Gareth Morgan, MD  traZODone (DESYREL) 50 MG tablet Take 1/2 to 1 tablet by  mouth at bedtime as needed  for sleep Patient not taking: Reported on 11/24/2015 11/20/15   Wardell Honour, MD    Family History Family History  Problem Relation Age of Onset  . Hypertension Mother   . Thyroid disease Sister     Social History Social History  Substance Use Topics  . Smoking status: Never Smoker  . Smokeless tobacco: Never Used  . Alcohol use 4.2 oz/week    7 Standard drinks or equivalent per week     Comment: Has 1 cocktail every day     Allergies   Review of patient's allergies indicates no known allergies.   Review of Systems Review of Systems  Constitutional: Positive for appetite change. Negative for fever.  HENT: Negative for sore throat.   Eyes: Positive for visual disturbance.  Respiratory: Negative for cough and shortness of breath.   Cardiovascular: Positive  for palpitations. Negative for chest pain (more like heart racing) and leg swelling.  Gastrointestinal: Negative for abdominal pain, nausea and vomiting.  Genitourinary: Negative for difficulty urinating.  Musculoskeletal: Negative for back pain and neck pain.  Skin: Negative for rash.  Neurological: Negative for dizziness, syncope, facial asymmetry, weakness, numbness and headaches.     Physical Exam Updated Vital Signs BP 142/97   Pulse 100   Temp 98.9 F (37.2 C)   Resp 17   LMP 11/16/2015   SpO2 99%   Physical Exam  Constitutional: She is oriented to person, place, and time. She appears well-developed and well-nourished. No distress.  HENT:  Head: Normocephalic and atraumatic.  Eyes: Conjunctivae and EOM  are normal.  No visual field deficits  Neck: Normal range of motion.  Cardiovascular: Regular rhythm, normal heart sounds and intact distal pulses.  Tachycardia present.  Exam reveals no gallop and no friction rub.   No murmur heard. Pulmonary/Chest: Effort normal and breath sounds normal. No respiratory distress. She has no wheezes. She has no rales.  Abdominal: Soft. She exhibits no distension. There is no tenderness. There is no guarding.  Musculoskeletal: She exhibits no edema or tenderness.  Neurological: She is alert and oriented to person, place, and time. She has normal strength. No cranial nerve deficit or sensory deficit. Coordination and gait normal. GCS eye subscore is 4. GCS verbal subscore is 5. GCS motor subscore is 6.  Skin: Skin is warm and dry. No rash noted. She is not diaphoretic. No erythema.  Nursing note and vitals reviewed.    ED Treatments / Results  Labs (all labs ordered are listed, but only abnormal results are displayed) Labs Reviewed  BASIC METABOLIC PANEL - Abnormal; Notable for the following:       Result Value   Sodium 134 (*)    Potassium 3.0 (*)    Chloride 98 (*)    Glucose, Bld 116 (*)    All other components within normal limits  URINALYSIS, ROUTINE W REFLEX MICROSCOPIC (NOT AT [facility]) - Abnormal; Notable for the following:    Hgb urine dipstick TRACE (*)    Ketones, ur 15 (*)    All other components within normal limits  URINE MICROSCOPIC-ADD ON - Abnormal; Notable for the following:    Squamous Epithelial / LPF 0-5 (*)    Bacteria, UA FEW (*)    All other components within normal limits  CBC  I-STAT BETA HCG BLOOD, ED ([facility], [facility], [facility] ONLY)  I-STAT TROPOININ, ED    EKG  EKG Interpretation  Date/Time:  Friday November 24 2015 17:19:52 EDT Ventricular Rate:  129 PR Interval:    QRS Duration: 83 QT Interval:  306 QTC Calculation: 449 R Axis:   39 Text Interpretation:  Sinus tachycardia Nonspecific T abnormalities, diffuse leads No previous  ECGs available Confirmed by [facility] MD, Felicia Bloomquist ([id number]) on 11/24/2015 6:54:23 PM       Radiology No results found.  Procedures Procedures (including critical care time)  Medications Ordered in ED Medications  sodium chloride 0.9 % bolus 1,000 mL (0 mLs Intravenous Stopped 11/24/15 2005)  potassium chloride SA (K-DUR,KLOR-CON) CR tablet 40 mEq (40 mEq Oral Given 11/24/15 2112)     Initial Impression / Assessment and Plan / ED Course  I have reviewed the triage vital signs and the nursing notes.  Pertinent labs & imaging results that were available during my care of the patient were reviewed by me and considered in my medical decision making (see  chart for details).  Clinical Course   40yo female with history of anxiety and high blood pressure presents with concern for palpitations.  Describes visual changes/floaters bilaterally when staring at computer screens/switching screens at work, however no visual field deficits by hx or exam, no double vision, normal EOM, no other neurologic symptoms, and doubt CVA/TIA/retinal detachment by hx and physical exam.  Denies overt chest pain but reports palpitations. EKG shows sinus tachycardia and troponin negative. Doubt ACS given duration since pt had symptoms with negative troponin.  Pt without dyspnea, no tachypnea, no PE risk factors, and doubt PE.  She has tachycardia noted on several office and ED visits.    Patient reports poor po intake and appetite over last several days, and suspect dehydration as etiology of palpitations.  Potassium mildly decreased at 3 and pt given 66meq in ED and 32meq BID for 2 days. Recommend continued close PCP follow up. Symptoms resolved after IV fluids in the ED. Patient discharged in stable condition with understanding of reasons to return.    Final Clinical Impressions(s) / ED Diagnoses   Final diagnoses:  Dehydration  Palpitations  Hypokalemia    New Prescriptions Discharge Medication List as of 11/24/2015   9:31 PM       Gareth Morgan, MD 11/25/15 1245

## 2015-11-29 ENCOUNTER — Other Ambulatory Visit: Payer: Self-pay | Admitting: Family Medicine

## 2015-12-16 ENCOUNTER — Ambulatory Visit (INDEPENDENT_AMBULATORY_CARE_PROVIDER_SITE_OTHER): Payer: 59 | Admitting: Family Medicine

## 2015-12-16 VITALS — BP 136/100 | HR 102 | Temp 98.9°F | Ht 63.0 in | Wt 190.2 lb

## 2015-12-16 DIAGNOSIS — Z136 Encounter for screening for cardiovascular disorders: Secondary | ICD-10-CM

## 2015-12-16 DIAGNOSIS — R42 Dizziness and giddiness: Secondary | ICD-10-CM

## 2015-12-16 DIAGNOSIS — R5383 Other fatigue: Secondary | ICD-10-CM | POA: Diagnosis not present

## 2015-12-16 DIAGNOSIS — I10 Essential (primary) hypertension: Secondary | ICD-10-CM | POA: Diagnosis not present

## 2015-12-16 LAB — CBC WITH DIFFERENTIAL/PLATELET
BASOS PCT: 0 %
Basophils Absolute: 0 cells/uL (ref 0–200)
Eosinophils Absolute: 61 cells/uL (ref 15–500)
Eosinophils Relative: 1 %
HEMATOCRIT: 35.4 % (ref 35.0–45.0)
HEMOGLOBIN: 12 g/dL (ref 11.7–15.5)
LYMPHS ABS: 1952 {cells}/uL (ref 850–3900)
Lymphocytes Relative: 32 %
MCH: 31.3 pg (ref 27.0–33.0)
MCHC: 33.9 g/dL (ref 32.0–36.0)
MCV: 92.2 fL (ref 80.0–100.0)
MONO ABS: 427 {cells}/uL (ref 200–950)
MPV: 10.5 fL (ref 7.5–12.5)
Monocytes Relative: 7 %
NEUTROS ABS: 3660 {cells}/uL (ref 1500–7800)
NEUTROS PCT: 60 %
Platelets: 370 10*3/uL (ref 140–400)
RBC: 3.84 MIL/uL (ref 3.80–5.10)
RDW: 12.4 % (ref 11.0–15.0)
WBC: 6.1 10*3/uL (ref 3.8–10.8)

## 2015-12-16 LAB — LIPID PANEL
CHOLESTEROL: 256 mg/dL — AB (ref 125–200)
HDL: 55 mg/dL (ref >=46)
LDL CALC: 177 mg/dL — AB (ref <130)
TRIGLYCERIDES: 121 mg/dL (ref <150)
Total CHOL/HDL Ratio: 4.7 Ratio (ref <=5.0)
VLDL: 24 mg/dL (ref <30)

## 2015-12-16 LAB — COMPLETE METABOLIC PANEL WITH GFR
ALT: 18 U/L (ref 6–29)
AST: 18 U/L (ref 10–30)
Albumin: 4.7 g/dL (ref 3.6–5.1)
Alkaline Phosphatase: 54 U/L (ref 33–115)
BUN: 8 mg/dL (ref 7–25)
CALCIUM: 9.6 mg/dL (ref 8.6–10.2)
CHLORIDE: 101 mmol/L (ref 98–110)
CO2: 27 mmol/L (ref 20–31)
Creat: 0.6 mg/dL (ref 0.50–1.10)
GFR, Est African American: >89 mL/min (ref >=60)
Glucose, Bld: 104 mg/dL — ABNORMAL HIGH (ref 65–99)
POTASSIUM: 4.3 mmol/L (ref 3.5–5.3)
SODIUM: 137 mmol/L (ref 135–146)
Total Bilirubin: 0.3 mg/dL (ref 0.2–1.2)
Total Protein: 8 g/dL (ref 6.1–8.1)

## 2015-12-16 LAB — TSH: TSH: 2.48 mIU/L

## 2015-12-16 MED ORDER — HYDROCHLOROTHIAZIDE 12.5 MG PO CAPS: 25.0000 mg | ORAL_CAPSULE | Freq: Every day | ORAL | Status: DC

## 2015-12-16 MED ORDER — POTASSIUM CHLORIDE ER 10 MEQ PO TBCR: 10.0000 meq | EXTENDED_RELEASE_TABLET | Freq: Every day | ORAL | 1 refills | Status: DC

## 2015-12-16 MED ORDER — AMLODIPINE BESYLATE 10 MG PO TABS: 10.0000 mg | ORAL_TABLET | Freq: Every day | ORAL | 3 refills | Status: DC

## 2015-12-16 MED ORDER — HYDROCHLOROTHIAZIDE 12.5 MG PO CAPS: 25.0000 mg | ORAL_CAPSULE | Freq: Every day | ORAL | 1 refills | Status: DC

## 2015-12-16 NOTE — Progress Notes (Signed)
Patient ID: Ann Thornton, female    DOB: 02/26/1976, 40 y.o.   MRN: [id number]  PCP: No PCP Per Patient  Chief Complaint  Patient presents with  . Hypertension  . Fatigue    Subjective:   HPI Presents for evaluation of hypertension and generalized fatigue.  40 year old African-American female presents today for evaluation of her blood pressure and to report increasing generalized fatigue. She reports that she has noticed increased swelling in her lower extremities 2 months which is subsequently worse at the end of the day. She denies palpitations chest pain or headache. Reports some early morning dizziness with rising from the bed. She reports some mild dizziness occasionally when lying down to go to bed.  She reports today that she has been experiencing some fatigue fatigue beginning this week.  She reports lying around more than usual this week.  Denies any other associated symptoms.  . Social History   Social History  . Marital status: Single    Spouse name: N/A  . Number of children: N/A  . Years of education: N/A   Occupational History  . customer service [place] History Main Topics  . Smoking status: Never Smoker  . Smokeless tobacco: Never Used  . Alcohol use 4.2 oz/week    7 Standard drinks or equivalent per week     Comment: Has 1 cocktail every day  . Drug use:      Comment: marjuana rare  . Sexual activity: Yes    Birth control/ protection: None   Other Topics Concern  . Not on file   Social History Narrative   Single without children   Working from home   Exercise - zumba once a week, fitness workout on Mondays   Diet - trying to be good, eating no salt.          As of 11/08/15:      Diet: N/A   Caffeine: No Sodas   Married: Engaged   House: Lives in apartment, 2 stories, 1 person    Pets: None   Current/Past profession: Data Entry    Exercise: No    Living Will: No    DNR: No    POA/HPOA: No          . Family  History  Problem Relation Age of Onset  . Hypertension Mother   . Thyroid disease Sister     Review of Systems  Constitutional: Positive for fatigue.       See HPI  Respiratory: Negative.   Cardiovascular: Negative.   Gastrointestinal: Negative.   Endocrine: Negative.   Neurological: Positive for dizziness.       See HPI    Patient Active Problem List   Diagnosis Date Noted  . Insomnia 04/08/2015  . Essential hypertension 03/23/2015  . Anxiety disorder 06/02/2014  . Panic attacks 06/02/2014     Prior to Admission medications   Medication Sig Start Date End Date Taking? Authorizing Provider  amLODipine (NORVASC) 10 MG tablet Take 1 tablet by mouth  daily 11/20/15  Yes Wardell Honour, MD  clonazePAM (KLONOPIN) 0.5 MG tablet Take 1 tablet (0.5 mg total) by mouth daily as needed for anxiety. 11/23/15  Yes Mancel Bale, PA-C  naproxen sodium (ANAPROX) 220 MG tablet Take 440 mg by mouth every 12 (twelve) hours as needed (pain).    Historical Provider, MD  potassium chloride (K-DUR) 10 MEQ tablet Take 1 tablet (10 mEq total) by mouth 2 (two) times  daily. 11/24/15 11/26/15  Gareth Morgan, MD  traZODone (DESYREL) 50 MG tablet Take 1/2 to 1 tablet by  mouth at bedtime as needed  for sleep Patient not taking: Reported on 11/24/2015 11/20/15   Wardell Honour, MD     No Known Allergies     Objective:  Physical Exam  Constitutional: She is oriented to person, place, and time. She appears well-developed and well-nourished.  HENT:  Head: Normocephalic and atraumatic.  Right Ear: External ear normal.  Left Ear: External ear normal.  Nose: Nose normal.  Mouth/Throat: Oropharynx is clear and moist.  Eyes: Conjunctivae and EOM are normal. Pupils are equal, round, and reactive to light.  Neck: Normal range of motion. Neck supple.  Cardiovascular: Normal rate, regular rhythm, normal heart sounds and intact distal pulses.   Pulmonary/Chest: Effort normal and breath sounds normal.  Abdominal:  Soft. Bowel sounds are normal.  Musculoskeletal: Normal range of motion.  Neurological: She is alert and oriented to person, place, and time.  Skin: Skin is warm and dry.  Psychiatric: She has a normal mood and affect. Her behavior is normal. Judgment and thought content normal.    . Vitals:   12/16/15 1006  BP: (!) 136/100  Pulse: (!) 102  Temp: 98.9 F (37.2 C)   Assessment & Plan:  1. Essential hypertension, systolic controlled , diastolic uncontrolled - COMPLETE METABOLIC PANEL WITH GFR -Plan add diuretic per JNC-8 guidelines dual therapy is indicated in the event that mono-therapy is ineffective in normalizing blood pressure.   Hydrochlorothiazide 12.5 mg  K-Dur 10 meq, recent episode of hypokalemia. K+ stable during this visit.  Follow-up to recheck blood pressure and potassium level in 4 weeks  2. Orthostatic dizziness - Orthostatic vital signs-stable Plan: Rise slowly from a lying or sitting position to reduce incidences of drops in blood pressure.  3. Other fatigue - CBC with Differential/Platelet - TSH  4. Encounter for screening for cardiovascular disorders, elevated. - Lipid panel Plan: Consider statin therapy   Carroll Sage. Kenton Kingfisher, MSN, FNP-C [facility]

## 2015-12-16 NOTE — Patient Instructions (Addendum)
I will follow up with you regarding her lab results.  Continue amlodipine 10 mg for hypertension  I am adding today Microzide 25 mg daily as your fluid pill to decrease swelling in your lower extremities and this medication will also help reduce your blood pressure.  I have also ordered a potassium supplement of 10 meq for you to take on a daily basis in combination with the Microzide to prevent further lowering of your potassium.  Please follow-up with me in 4 weeks to re-check your blood pressure and also to reevaluate the swelling in your lower extremities.    IF you received an x-ray today, you will receive an invoice from [facility]. Please contact [facility] at [phone] with questions or concerns regarding your invoice.   IF you received labwork today, you will receive an invoice from [facility]. Please contact [facility] at [phone] with questions or concerns regarding your invoice.   Our billing staff will not be able to assist you with questions regarding bills from these companies.  You will be contacted with the lab results as soon as they are available. The fastest way to get your results is to activate your My Chart account. Instructions are located on the last page of this paperwork. If you have not heard from us regarding the results in 2 weeks, please contact this office.      Exercising to Stay Healthy Exercising regularly is important. It has many health benefits, such as:  Improving your overall fitness, flexibility, and endurance.  Increasing your bone density.  Helping with weight control.  Decreasing your body fat.  Increasing your muscle strength.  Reducing stress and tension.  Improving your overall health. In order to become healthy and stay healthy, it is recommended that you do moderate-intensity and vigorous-intensity exercise. You can tell that you are exercising at a moderate intensity if you  have a higher heart rate and faster breathing, but you are still able to hold a conversation. You can tell that you are exercising at a vigorous intensity if you are breathing much harder and faster and cannot hold a conversation while exercising. HOW OFTEN SHOULD I EXERCISE? Choose an activity that you enjoy and set realistic goals. Your health care provider can help you to make an activity plan that works for you. Exercise regularly as directed by your health care provider. This may include:   Doing resistance training twice each week, such as:  Push-ups.  Sit-ups.  Lifting weights.  Using resistance bands.  Doing a given intensity of exercise for a given amount of time. Choose from these options:  150 minutes of moderate-intensity exercise every week.  75 minutes of vigorous-intensity exercise every week.  A mix of moderate-intensity and vigorous-intensity exercise every week. Children, pregnant women, people who are out of shape, people who are overweight, and older adults may need to consult a health care provider for individual recommendations. If you have any sort of medical condition, be sure to consult your health care provider before starting a new exercise program.  WHAT ARE SOME EXERCISE IDEAS? Some moderate-intensity exercise ideas include:   Walking at a rate of 1 mile in 15 minutes.  Biking.  Hiking.  Golfing.  Dancing. Some vigorous-intensity exercise ideas include:   Walking at a rate of at least 4.5 miles per hour.  Jogging or running at a rate of 5 miles per hour.  Biking at a rate of at least 10 miles per hour.  Lap swimming.  Roller-skating or in-line skating.  Cross-country skiing.  Vigorous competitive sports, such as football, basketball, and soccer.  Jumping rope.  Aerobic dancing. WHAT ARE SOME EVERYDAY ACTIVITIES THAT CAN HELP ME TO GET EXERCISE?  Yard work, such as:  Psychologist, educational.  Raking and bagging leaves.  Washing and  waxing your car.  Pushing a stroller.  Shoveling snow.  Gardening.  Washing windows or floors. HOW CAN I BE MORE ACTIVE IN MY DAY-TO-DAY ACTIVITIES?  Use the stairs instead of the elevator.  Take a walk during your lunch break.  If you drive, park your car farther away from work or school.  If you take public transportation, get off one stop early and walk the rest of the way.  Make all of your phone calls while standing up and walking around.  Get up, stretch, and walk around every 30 minutes throughout the day. WHAT GUIDELINES SHOULD I FOLLOW WHILE EXERCISING?  Do not exercise so much that you hurt yourself, feel dizzy, or get very short of breath.  Consult your health care provider before starting a new exercise program.  Wear comfortable clothes and shoes with good support.  Drink plenty of water while you exercise to prevent dehydration or heat stroke. Body water is lost during exercise and must be replaced.  Work out until you breathe faster and your heart beats faster.   This information is not intended to replace advice given to you by your health care provider. Make sure you discuss any questions you have with your health care provider.   Document Released: 05/11/2010 Document Revised: 04/29/2014 Document Reviewed: 09/09/2013 Elsevier Interactive Patient Education [employer].

## 2015-12-21 MED ORDER — PRAVASTATIN SODIUM 40 MG PO TABS: 20.0000 mg | ORAL_TABLET | Freq: Every day | ORAL | 0 refills | Status: DC

## 2015-12-21 NOTE — Addendum Note (Signed)
Addended by: Scot Jun on: 12/21/2015 08:10 AM   Modules accepted: Orders

## 2015-12-28 ENCOUNTER — Other Ambulatory Visit: Payer: Self-pay | Admitting: Family Medicine

## 2016-01-19 ENCOUNTER — Other Ambulatory Visit: Payer: Self-pay | Admitting: Family Medicine

## 2016-01-20 NOTE — Telephone Encounter (Signed)
PT NEEDS OV FOR FOLLOW UP

## 2016-01-20 NOTE — Telephone Encounter (Signed)
Pt due for OV - sent 30 day supply with message to return for visit

## 2016-02-11 ENCOUNTER — Other Ambulatory Visit: Payer: Self-pay | Admitting: Family Medicine

## 2016-06-02 ENCOUNTER — Emergency Department (HOSPITAL_COMMUNITY): Payer: 59

## 2016-06-02 ENCOUNTER — Encounter (HOSPITAL_COMMUNITY): Payer: Self-pay | Admitting: Emergency Medicine

## 2016-06-02 ENCOUNTER — Emergency Department (HOSPITAL_COMMUNITY)
Admission: EM | Admit: 2016-06-02 | Discharge: 2016-06-02 | Disposition: A | Discharge status: Home / Self Care | Payer: 59 | Attending: Emergency Medicine | Admitting: Emergency Medicine

## 2016-06-02 DIAGNOSIS — I10 Essential (primary) hypertension: Secondary | ICD-10-CM | POA: Insufficient documentation

## 2016-06-02 DIAGNOSIS — R112 Nausea with vomiting, unspecified: Secondary | ICD-10-CM

## 2016-06-02 DIAGNOSIS — R197 Diarrhea, unspecified: Secondary | ICD-10-CM | POA: Insufficient documentation

## 2016-06-02 LAB — LIPASE, BLOOD: LIPASE: 23 U/L (ref 11–51)

## 2016-06-02 LAB — CBC
HCT: 36.5 % (ref 36.0–46.0)
HEMOGLOBIN: 12.4 g/dL (ref 12.0–15.0)
MCH: 30.7 pg (ref 26.0–34.0)
MCHC: 34 g/dL (ref 30.0–36.0)
MCV: 90.3 fL (ref 78.0–100.0)
Platelets: 418 10*3/uL — ABNORMAL HIGH (ref 150–400)
RBC: 4.04 MIL/uL (ref 3.87–5.11)
RDW: 11.8 % (ref 11.5–15.5)
WBC: 6 10*3/uL (ref 4.0–10.5)

## 2016-06-02 LAB — URINALYSIS, ROUTINE W REFLEX MICROSCOPIC
Bilirubin Urine: NEGATIVE
GLUCOSE, UA: NEGATIVE mg/dL
KETONES UR: NEGATIVE mg/dL
Leukocytes, UA: NEGATIVE
NITRITE: NEGATIVE
PROTEIN: NEGATIVE mg/dL
Specific Gravity, Urine: 1.005 (ref 1.005–1.030)
pH: 6 (ref 5.0–8.0)

## 2016-06-02 LAB — COMPREHENSIVE METABOLIC PANEL
ALT: 17 U/L (ref 14–54)
AST: 19 U/L (ref 15–41)
Albumin: 5 g/dL (ref 3.5–5.0)
Alkaline Phosphatase: 55 U/L (ref 38–126)
Anion gap: 10 (ref 5–15)
BUN: 9 mg/dL (ref 6–20)
CHLORIDE: 101 mmol/L (ref 101–111)
CO2: 25 mmol/L (ref 22–32)
Calcium: 9.6 mg/dL (ref 8.9–10.3)
Creatinine, Ser: 0.59 mg/dL (ref 0.44–1.00)
GFR calc Af Amer: >60 mL/min (ref >60)
Glucose, Bld: 104 mg/dL — ABNORMAL HIGH (ref 65–99)
Potassium: 3.6 mmol/L (ref 3.5–5.1)
Sodium: 136 mmol/L (ref 135–145)
Total Bilirubin: 0.6 mg/dL (ref 0.3–1.2)
Total Protein: 9.1 g/dL — ABNORMAL HIGH (ref 6.5–8.1)

## 2016-06-02 LAB — I-STAT BETA HCG BLOOD, ED (MC, WL, AP ONLY): I-stat hCG, quantitative: <5 m[IU]/mL (ref <5)

## 2016-06-02 LAB — I-STAT TROPONIN, ED: TROPONIN I, POC: 0 ng/mL (ref 0.00–0.08)

## 2016-06-02 MED ORDER — ONDANSETRON HCL 4 MG/2ML IJ SOLN
4.0000 mg | Freq: Once | INTRAMUSCULAR | Status: AC
  Administered 2016-06-02: 4 mg via INTRAVENOUS
  Filled 2016-06-02: qty 2

## 2016-06-02 MED ORDER — SODIUM CHLORIDE 0.9 % IV BOLUS (SEPSIS)
1000.0000 mL | Freq: Once | INTRAVENOUS | Status: AC
  Administered 2016-06-02: 1000 mL via INTRAVENOUS

## 2016-06-02 MED ORDER — ONDANSETRON 4 MG PO TBDP: 4.0000 mg | ORAL_TABLET | Freq: Three times a day (TID) | ORAL | 0 refills | Status: DC | PRN

## 2016-06-02 NOTE — ED Notes (Signed)
Patient d/c'd self care.  F/U and medications reviewed.  Patient verbalized understanding. 

## 2016-06-02 NOTE — ED Triage Notes (Signed)
Patient is complaining of nausea, vomiting, diarrhea since Tuesday. Reports chest discomfort starting Friday. Denies cough.

## 2016-06-02 NOTE — Discharge Instructions (Signed)

## 2016-06-02 NOTE — ED Provider Notes (Signed)
Emergency Department Provider Note   I have reviewed the triage vital signs and the nursing notes.   HISTORY  Chief Complaint Chest Pain; Emesis; and Diarrhea   HPI Ann Thornton is a 41 y.o. female with PMH of anxiety and HTN presents to the emergency department for evaluation of nausea, vomiting, diarrhea for the past 6 days. She developed some associated left-sided chest discomfort has been occurring intermittently over the past 2 days. She describes the chest pain as tightness. Symptoms are intermittent with no obvious exacerbating or alleviating factors. The chest pains in the context of multiple episodes of vomiting for the past 6 days. No vomiting or diarrhea this morning. She also feels it for heart is racing intermittently. No difficulty breathing. No abdominal pain.   Past Medical History:  Diagnosis Date  . Allergy   . Anxiety   . High blood pressure     Patient Active Problem List   Diagnosis Date Noted  . Insomnia 04/08/2015  . Essential hypertension 03/23/2015  . Anxiety disorder 06/02/2014  . Panic attacks 06/02/2014    Past Surgical History:  Procedure Laterality Date  . EYE SURGERY Right 2009   Tightened muscle of eye    Current Outpatient Rx  . Order #: [id number] Class: Normal  . Order #: [id number] Class: Print  . Order #: [id number] Class: Historical Med  . Order #: [id number] Class: Normal  . Order #: [id number] Class: Normal  . Order #: [id number] Class: Print  . Order #: [id number] Class: Normal  . Reflex Order#: [id number]: Normal    Allergies Patient has no known allergies.  Family History  Problem Relation Age of Onset  . Hypertension Mother   . Thyroid disease Sister     Social History Social History  Substance Use Topics  . Smoking status: Never Smoker  . Smokeless tobacco: Never Used  . Alcohol use 4.2 oz/week    7 Standard drinks or equivalent per week     Comment: Has 1 cocktail every day    Review of  Systems  Constitutional: No fever/chills Eyes: No visual changes. ENT: No sore throat. Cardiovascular: Denies chest pain. Respiratory: Denies shortness of breath. Gastrointestinal: No abdominal pain. Positive nausea, vomiting, and diarrhea.  No constipation. Genitourinary: Negative for dysuria. Musculoskeletal: Negative for back pain. Skin: Negative for rash. Neurological: Negative for headaches, focal weakness or numbness.  10-point ROS otherwise negative.  ____________________________________________   PHYSICAL EXAM:  VITAL SIGNS: ED Triage Vitals  Enc Vitals Group     BP 06/02/16 1033 (!) 163/103     Pulse Rate 06/02/16 1033 112     Resp 06/02/16 1033 18     Temp 06/02/16 1033 98.9 F (37.2 C)     Temp Source 06/02/16 1033 Oral     SpO2 06/02/16 1033 99 %     Weight 06/02/16 1034 192 lb (87.1 kg)     Height 06/02/16 1034 5\' 4"  (1.626 m)     Pain Score 06/02/16 1035 8   Constitutional: Alert and oriented. Well appearing and in no acute distress. Eyes: Conjunctivae are normal.  Head: Atraumatic. Nose: No congestion/rhinnorhea. Mouth/Throat: Mucous membranes are dry.  Neck: No stridor Cardiovascular: Tachycardia. Good peripheral circulation. Grossly normal heart sounds.   Respiratory: Normal respiratory effort.  No retractions. Lungs CTAB. Gastrointestinal: Soft and nontender. No distention.  Musculoskeletal: No lower extremity tenderness nor edema. No gross deformities of extremities. Neurologic:  Normal speech and language. No gross focal neurologic deficits are appreciated.  Skin:  Skin is warm, dry  and intact. No rash noted. Psychiatric: Mood and affect are normal. Speech and behavior are normal.  ____________________________________________   LABS (all labs ordered are listed, but only abnormal results are displayed)  Labs Reviewed  CBC - Abnormal; Notable for the following:       Result Value   Platelets 418 (*)    All other components within normal  limits  COMPREHENSIVE METABOLIC PANEL - Abnormal; Notable for the following:    Glucose, Bld 104 (*)    Total Protein 9.1 (*)    All other components within normal limits  URINALYSIS, ROUTINE W REFLEX MICROSCOPIC - Abnormal; Notable for the following:    Color, Urine STRAW (*)    Hgb urine dipstick MODERATE (*)    Bacteria, UA RARE (*)    Squamous Epithelial / LPF 0-5 (*)    All other components within normal limits  LIPASE, BLOOD  I-STAT TROPOININ, ED  I-STAT BETA HCG BLOOD, ED ([facility], [facility], [facility] ONLY)   ____________________________________________  EKG   EKG Interpretation  Date/Time:  Sunday June 02 2016 10:42:10 EST Ventricular Rate:  97 PR Interval:    QRS Duration: 84 QT Interval:  361 QTC Calculation: 459 R Axis:   52 Text Interpretation:  Sinus rhythm RSR' in V1 or V2, right VCD or RVH Baseline wander in lead(s) II III aVF V6 Since last tracing rate slower Confirmed by Eulis Foster  MD, ELLIOTT [id number]) on 06/02/2016 10:48:10 AM       ____________________________________________  RADIOLOGY  Dg Chest 2 View  Result Date: 06/02/2016 CLINICAL DATA:  Chest pain EXAM: CHEST  2 VIEW COMPARISON:  None. FINDINGS: Heart and mediastinal contours are within normal limits. No focal opacities or effusions. No acute bony abnormality. IMPRESSION: No active cardiopulmonary disease. Electronically Signed   By: Rolm Baptise M.D.   On: 06/02/2016 11:11    ____________________________________________   PROCEDURES  Procedure(s) performed:   Procedures  None ____________________________________________   INITIAL IMPRESSION / ASSESSMENT AND PLAN / ED COURSE  Pertinent labs & imaging results that were available during my care of the patient were reviewed by me and considered in my medical decision making (see chart for details).  Patient presents to the emergency department for evaluation of nausea, vomiting, diarrhea for the past 6 days some associated chest discomfort and occasional  sensation of heart palpitations. No URI symptoms. Low suspicion for influenza with no respiratory symptoms. She is afebrile here with some mild tachycardia and hypertension. Abdomen is soft and nontender. Lungs exam is unremarkable. Plan for IV fluids, nausea medication, labs and reassessment.   12:52 PM Patient is feeling much better after IV fluids and nausea medication. Labs are unremarkable. Heart rate is down trending. Plan for discharge with Zofran and primary care physician follow-up as needed.   At this time, I do not feel there is any life-threatening condition present. I have reviewed and discussed all results (EKG, imaging, lab, urine as appropriate), exam findings with patient. I have reviewed nursing notes and appropriate previous records.  I feel the patient is safe to be discharged home without further emergent workup. Discussed usual and customary return precautions. Patient and family (if present) verbalize understanding and are comfortable with this plan.  Patient will follow-up with their primary care provider. If they do not have a primary care provider, information for follow-up has been provided to them. All questions have been answered.  ____________________________________________  FINAL CLINICAL IMPRESSION(S) / ED DIAGNOSES  Final diagnoses:  Nausea vomiting and diarrhea  MEDICATIONS GIVEN DURING THIS VISIT:  Medications  sodium chloride 0.9 % bolus 1,000 mL (0 mLs Intravenous Stopped 06/02/16 1305)  ondansetron (ZOFRAN) injection 4 mg (4 mg Intravenous Given 06/02/16 1134)     NEW OUTPATIENT MEDICATIONS STARTED DURING THIS VISIT:  Discharge Medication List as of 06/02/2016 12:54 PM    START taking these medications   Details  ondansetron (ZOFRAN ODT) 4 MG disintegrating tablet Take 1 tablet (4 mg total) by mouth every 8 (eight) hours as needed for nausea or vomiting., Starting Sun 06/02/2016, Print         Note:  This document was prepared using Dragon  voice recognition software and may include unintentional dictation errors.  Nanda Quinton, MD Emergency Medicine   Margette Fast, MD 06/02/16 [phone]

## 2016-06-19 ENCOUNTER — Ambulatory Visit (INDEPENDENT_AMBULATORY_CARE_PROVIDER_SITE_OTHER): Payer: 59 | Admitting: Obstetrics and Gynecology

## 2016-06-19 ENCOUNTER — Encounter: Payer: Self-pay | Admitting: Obstetrics and Gynecology

## 2016-06-19 VITALS — BP 148/90 | HR 96 | Resp 16 | Ht 63.0 in | Wt 196.0 lb

## 2016-06-19 DIAGNOSIS — Z01419 Encounter for gynecological examination (general) (routine) without abnormal findings: Secondary | ICD-10-CM

## 2016-06-19 DIAGNOSIS — Z3169 Encounter for other general counseling and advice on procreation: Secondary | ICD-10-CM

## 2016-06-19 DIAGNOSIS — I10 Essential (primary) hypertension: Secondary | ICD-10-CM

## 2016-06-19 DIAGNOSIS — Z124 Encounter for screening for malignant neoplasm of cervix: Secondary | ICD-10-CM | POA: Diagnosis not present

## 2016-06-19 DIAGNOSIS — N898 Other specified noninflammatory disorders of vagina: Secondary | ICD-10-CM | POA: Diagnosis not present

## 2016-06-19 NOTE — Addendum Note (Signed)
Addended by: Nicholes Rough on: 06/19/2016 03:27 PM   Modules accepted: Orders

## 2016-06-19 NOTE — Progress Notes (Addendum)
41 y.o. G0P0000 MarriedAfrican AmericanF here as a new patient for an annual exam.   Period Cycle (Days): 28 Period Duration (Days): 4-5 days Period Pattern: Regular Menstrual Flow: Moderate Menstrual Control: Tampon, Thin pad Menstrual Control Change Freq (Hours): 3-4 hours Dysmenorrhea: (!) Mild Dysmenorrhea Symptoms: Headache, Cramping  Sexually active, with her husband for 10 years, never used contraception. She would like to be pregnant. She is interested in seeing a fertility specialist.  She had chlamydia in high school. Treated as an out patient. Her Husband has children, youngest is 85.   Patient's last menstrual period was 05/28/2016.          Sexually active: Yes.    The current method of family planning is none.    Exercising: No.  The patient does not participate in regular exercise at present. Smoker:  no  Health Maintenance: Pap:  About 3 years ago per patient; normal History of abnormal Pap:  Yes; years ago per patient, had a colposcopy, no treatment needed.  MMG:  never Colonoscopy:  never BMD:   never TDaP:  unsure Gardasil: never   reports that she has never smoked. She has never used smokeless tobacco. She reports that she drinks about 4.2 oz of alcohol per week . She reports that she does not use drugs. 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 Close with her nieces, nephew and step children. From [place], all of her family is here.   Past Medical History:  Diagnosis Date  . Allergy   . Anxiety   . High blood pressure     Past Surgical History:  Procedure Laterality Date  . EYE SURGERY Right 2009   Tightened muscle of eye    Current Outpatient Prescriptions  Medication Sig Dispense Refill  . amLODipine (NORVASC) 10 MG tablet Take 1 tablet (10 mg total) by mouth daily. 30 tablet 3  . clonazePAM (KLONOPIN) 0.5 MG tablet Take 1 tablet (0.5 mg total) by mouth daily as needed for anxiety. 30 tablet 0  . naproxen sodium (ANAPROX) 220 MG tablet Take 440 mg by mouth  every 12 (twelve) hours as needed (pain).    . traZODone (DESYREL) 50 MG tablet Take 1/2 to 1 tablet by  mouth at bedtime as needed  for sleep (Patient taking differently: Take 25mg  by mouth at bedtime as needed for sleep) 30 tablet 0   No current facility-administered medications for this visit.     Family History  Problem Relation Age of Onset  . Hypertension Mother   . Thyroid disease Sister     Review of Systems  Constitutional: Negative.   HENT: Negative.   Eyes: Negative.   Respiratory: Negative.   Cardiovascular: Negative.   Gastrointestinal: Negative.   Endocrine: Negative.   Genitourinary: Negative.   Musculoskeletal: Negative.   Skin: Negative.   Allergic/Immunologic: Negative.   Neurological: Negative.   Hematological: Negative.   Psychiatric/Behavioral: Negative.     Exam:   BP (!) 158/80 (BP Location: Right Arm, Patient Position: Sitting, Cuff Size: Normal)   Pulse 96   Resp 16   Ht 5\' 3"  (1.6 m)   Wt 196 lb (88.9 kg)   LMP 05/28/2016   BMI 34.72 kg/m   Weight change: @WEIGHTCHANGE @ Height:   Height: 5\' 3"  (160 cm)  Ht Readings from Last 3 Encounters:  06/19/16 5\' 3"  (1.6 m)  06/02/16 5\' 4"  (1.626 m)  12/16/15 5\' 3"  (1.6 m)    General appearance: alert, cooperative and appears stated age Head: Normocephalic, without obvious  abnormality, atraumatic Neck: no adenopathy, supple, symmetrical, trachea midline and thyroid normal to inspection and palpation Lungs: clear to auscultation bilaterally Cardiovascular: regular rate and rhythm Breasts: normal appearance, no masses or tenderness Heart: regular rate and rhythm Abdomen: soft, non-tender; bowel sounds normal; no masses,  no organomegaly Extremities: extremities normal, atraumatic, no cyanosis or edema Skin: Skin color, texture, turgor normal. No rashes or lesions Lymph nodes: Cervical, supraclavicular, and axillary nodes normal. No abnormal inguinal nodes palpated Neurologic: Grossly  normal   Pelvic: External genitalia:  no lesions              Urethra:  normal appearing urethra with no masses, tenderness or lesions              Bartholins and Skenes: normal                 Vagina: normal appearing vagina with normal color and discharge, no lesions              Cervix: no lesions               Bimanual Exam:  Uterus:  anteverted, mobile, not appreciably enlarged, exam slightly limited by BMI. Not tender              Adnexa: no mass, fullness, tenderness               Rectovaginal: Confirms               Anus:  normal sphincter tone, no lesions  Chaperone was present for exam.  A:  Well Woman with normal exam  Primary infertiltiy  H/O chlamydia  Hypertension  P:   BBT charts  Semen analysis  Call with her cycle to set up HSG, will pre treat with doxycycline (100 mg po BID x 5 days, starting the night prior to the HSG). Reviewed the procedure  Labs and immunizations are UTD  Mammogram  Discussed breast self exam  Start PNV  Will refer to the Infertility clinic  Recheck BP after the visit, 140/90. She is on medication and will f/u with her primary MD  In addition to the annual exam approximately 10 minutes was spent in counseling about infertility.   CC: PCP  Addendum: error above, she did have an increase in a watery, white vaginal d/c. On questioning she noted an intermittently increased vaginal d/c in the last month. No itching, burning, irritation, or odor. Wet prep probe sent

## 2016-06-19 NOTE — Addendum Note (Signed)
Addended by: Dorothy Spark on: 06/19/2016 03:23 PM   Modules accepted: Orders

## 2016-06-19 NOTE — Patient Instructions (Signed)
Call the office and ask for [facility] with the beginning of your next cycle to schedule the [facility]  Infertility Infertility is when you are unable to get pregnant (conceive) after a year of having sex regularly without using birth control. Infertility can also mean that a woman is not able to carry a pregnancy to full term. Both women and men can have fertility problems. What causes infertility? What Causes Infertility in Women?  There are many possible causes of infertility in women. For some women, the cause of infertility is not known (unexplained infertility). Infertility can also be linked to more than one cause. Infertility problems in women can be caused by problems with the menstrual cycle or reproductive organs, certain medical conditions, and factors related to lifestyle and age.  Problems with your menstrual cycle can interfere with your ovaries producing eggs (ovulation). This can make it difficult to get pregnant. This includes having a menstrual cycle that is very long, very short, or irregular.  Problems with reproductive organs can include:  An abnormally narrow cervix or a cervix that does not remain closed during a pregnancy.  A blockage in your fallopian tubes.  An abnormally shaped uterus.  Uterine fibroids. This is a tissue mass (tumor) that can develop on your uterus.  Medical conditions that can affect a woman's fertility include:  Polycystic ovarian syndrome (PCOS). This is a hormonal disorder that can cause small cysts to grow on your ovaries. This is the most common cause of infertility in women.  Endometriosis. This is a condition in which the tissue that lines your uterus (endometrium) grows outside of its normal location.  Primary ovary insufficiency. This is when your ovaries stop producing eggs and hormones before the age of 59.  Sexually transmitted diseases, such as chlamydia or gonorrhea. These infections can cause scarring in your fallopian tubes. This makes  it difficult for eggs to reach your uterus.  Autoimmune disorders. These are disorders in which your immune system attacks normal, healthy cells.  Hormone imbalances.  Other factors include:  Age. A woman's fertility declines with age, especially after her mid-40s.  Being under- or overweight.  Drinking too much alcohol.  Using drugs.  Exercising excessively.  Being exposed to environmental toxins, such as radiation, pesticides, and certain chemicals. What Causes Infertility in Men?  There are many causes of infertility in men. Infertility can be linked to more than one cause. Infertility problems in men can be caused by problems with sperm or the reproductive organs, certain medical conditions, and factors related to lifestyle and age. Some men have unexplained infertility.  Problems with sperm. Infertility can result if there is a problem producing:  Enough sperm (low sperm count).  Enough normally-shaped sperm (sperm morphology).  Sperm that are able to reach the egg (poor motility).  Infertility can also be caused by:  A problem with hormones.  Enlarged veins (varicoceles), cysts (spermatoceles), or tumors of the testicles.  Sexual dysfunction.  Injury to the testicles.  A birth defect, such as not having the tubes that carry sperm (vas deferens).  Medical conditions that can affect a man's fertility include:  Diabetes.  Cancer treatments, such as chemotherapy or radiation.  Klinefelter syndrome. This is an inherited genetic disorder.  Thyroid problems, such as an under- or overactive thyroid.  Cystic fibrosis.  Sexually transmitted diseases.  Other factors include:  Age. A man's fertility declines with age.  Drinking too much alcohol.  Using drugs.  Being exposed to environmental toxins, such as pesticides  and lead. What are the symptoms of infertility? Being unable to get pregnant after one year of having regular sex without using birth control  is the only sign of infertility. How is infertility diagnosed? In order to be diagnosed with infertility, both partners will have a physical exam. Both partners will also have an extensive medical and sexual history taken. If there is no obvious reason for infertility, additional tests may be done. What Tests Will Women Have?  Women may first have tests to check whether they are ovulating each month. The tests may include:  Blood tests to check hormone levels.  An ultrasound of the ovaries. This looks for possible problems on or in the ovaries.  Taking a small sample of the tissue that lines the uterus for examination under a microscope (endometrial biopsy). Women who are ovulating may have additional tests. These may include:  Hysterosalpingography.  This is an X-ray of the fallopian tubes and uterus taken after a specific type of dye is injected.  This test can show the shape of the uterus and whether the fallopian tubes are open.  Laparoscopy.  In this test, a lighted tube (laparoscope) is used to look for problems in the fallopian tubes and other female organs.  Transvaginal ultrasound.  This is an imaging test to check for abnormalities of the uterus and ovaries.  A health care provider can use this test to count the number of follicles on the ovaries.  Hysteroscopy.  This test involves using a lighted tube to examine the cervix and inside the uterus.  It is done to find any abnormalities inside the uterus. What Tests Will Men Have?  Tests for men's infertility includes:  Semen tests to check sperm count, morphology, and motility.  Blood tests to check for hormone levels.  Taking a small sample of tissue from inside a testicle (biopsy). This is examined under a microscope.  Blood tests to check for genetic abnormalities (genetic testing). How are women treated for infertility? Treatment depends on the cause of infertility. Most cases of infertility in women are  treated with medicine or surgery.  Women may take medicine to:  Correct ovulation problems.  Treat other health conditions, such as PCOS.  Surgery may be done to:  Repair damage to the ovaries, fallopian tubes, cervix, or uterus.  Remove growths from the uterus.  Remove scar tissue from the uterus, pelvis, or other female organs. How are men treated for infertility? Treatment depends on the cause of infertility. Most cases of infertility in men are treated with medicine or surgery.  Men may take medicine to:  Correct hormone problems.  Treat other health conditions.  Treat sexual dysfunction.  Surgery may be done to:  Remove blockages in the reproductive tract.  Correct other structural problems of the reproductive tract. What is assisted reproductive technology? Assisted reproductive technology (ART) refers to all treatments and procedures that combine eggs and sperm outside the body to try to help a couple conceive. ART is often combined with fertility drugs to stimulate ovulation. Sometimes ART is done using eggs retrieved from another woman's body (donor eggs) or from previously frozen fertilized eggs (embryos). There are different types of ART. These include:  Intrauterine insemination (IUI).  In this procedure, sperm is placed directly into a woman's uterus with a long, thin tube.  This may be most effective for infertility caused by sperm problems, including low sperm count and low motility.  Can be used in combination with fertility drugs.  In vitro  fertilization (IVF).  This is often done when a woman's fallopian tubes are blocked or when a man has low sperm counts.  Fertility drugs stimulate the ovaries to produce multiple eggs. Once mature, these eggs are removed from the body and combined with the sperm to be fertilized.  These fertilized eggs are then placed in the woman's uterus. This information is not intended to replace advice given to you by your  health care provider. Make sure you discuss any questions you have with your health care provider. Document Released: 04/11/2003 Document Revised: 09/08/2015 Document Reviewed: 12/22/2013 Elsevier Interactive Patient Education  2017 [employer]. Hysterosalpingography Hysterosalpingography is a procedure to look inside your uterus and fallopian tubes. During this procedure, contrast dye is injected into your uterus through your vagina and cervix to illuminate your uterus while X-ray pictures are taken. This procedure may help your health care provider determine whether you have uterine tumors, adhesions, or structural abnormalities. It is commonly used to help determine why a woman is unable to have children (infertility). The procedure usually lasts about 15-30 minutes. LET [facility] CARE PROVIDER KNOW ABOUT:  Any allergies you have.  All medicines you are taking, including vitamins, herbs, eye drops, creams, and over-the-counter medicines.  Previous problems you or members of your family have had with the use of anesthetics.  Any blood disorders you have.  Previous surgeries you have had.  Medical conditions you have. RISKS AND COMPLICATIONS Generally, this is a safe procedure. However, as with any procedure, problems can occur. Possible problems include:  Infection in the lining of the uterus (endometritis) or fallopian tubes (salpingitis).  Damage or perforation of the uterus or fallopian tubes.  An allergic reaction to the contrast dye used to perform the X-ray. BEFORE THE PROCEDURE  Schedule the procedure after your period stops, but before your next ovulation. This is usually between day 5 and day 10 of your last period. Day 1 is the first day of your period.  Ask your health care provider about changing or stopping your regular medicines.  You may eat and drink as normal.  Empty your bladder before the procedure begins. PROCEDURE  You may be given a medicine to relax  you (sedative) or an over-the-counter pain medicine to lessen any discomfort during the procedure.  You will lie down on an X-ray table with your feet in stirrups.  A device called a speculum will be placed into your vagina. This allows your health care provider to see inside your vagina to the cervix.  The cervix will be washed with a special soap.  A thin, flexible tube will be passed through the cervix into the uterus.  Contrast dye will be put into this tube.  Several X-rays will be taken as the contrast dye spreads through the uterus and fallopian tubes.  The tube will be taken out after the procedure. What to expect after the procedure  Most of the contrast dye will flow out of your vagina naturally. You may want to wear a sanitary pad.  You may feel mild cramping and notice a little bleeding from your vagina. This should go away in 24 hours.  Ask when your test results will be ready. Make sure you get your test results. This information is not intended to replace advice given to you by your health care provider. Make sure you discuss any questions you have with your health care provider. Document Released: 05/11/2004 Document Revised: 09/14/2015 Document Reviewed: 10/09/2012 Elsevier Interactive Patient Education  2017 [employer]  EXERCISE AND DIET:  We recommended that you start or continue a regular exercise program for good health. Regular exercise means any activity that makes your heart beat faster and makes you sweat.  We recommend exercising at least 30 minutes per day at least 3 days a week, preferably 4 or 5.  We also recommend a diet low in fat and sugar.  Inactivity, poor dietary choices and obesity can cause diabetes, heart attack, stroke, and kidney damage, among others.    ALCOHOL AND SMOKING:  Women should limit their alcohol intake to no more than 7 drinks/beers/glasses of wine (combined, not each!) per week. Moderation of alcohol intake to this level decreases  your risk of breast cancer and liver damage. And of course, no recreational drugs are part of a healthy lifestyle.  And absolutely no smoking or even second hand smoke. Most people know smoking can cause heart and lung diseases, but did you know it also contributes to weakening of your bones? Aging of your skin?  Yellowing of your teeth and nails?  CALCIUM AND VITAMIN D:  Adequate intake of calcium and Vitamin D are recommended.  The recommendations for exact amounts of these supplements seem to change often, but generally speaking 600 mg of calcium (either carbonate or citrate) and 800 units of Vitamin D per day seems prudent. Certain women may benefit from higher intake of Vitamin D.  If you are among these women, your doctor will have told you during your visit.    PAP SMEARS:  Pap smears, to check for cervical cancer or precancers,  have traditionally been done yearly, although recent scientific advances have shown that most women can have pap smears less often.  However, every woman still should have a physical exam from her gynecologist every year. It will include a breast check, inspection of the vulva and vagina to check for abnormal growths or skin changes, a visual exam of the cervix, and then an exam to evaluate the size and shape of the uterus and ovaries.  And after 41 years of age, a rectal exam is indicated to check for rectal cancers. We will also provide age appropriate advice regarding health maintenance, like when you should have certain vaccines, screening for sexually transmitted diseases, bone density testing, colonoscopy, mammograms, etc.   MAMMOGRAMS:  All women over 82 years old should have a yearly mammogram. Many facilities now offer a "3D" mammogram, which may cost around $50 extra out of pocket. If possible,  we recommend you accept the option to have the 3D mammogram performed.  It both reduces the number of women who will be called back for extra views which then turn out to be  normal, and it is better than the routine mammogram at detecting truly abnormal areas.    COLONOSCOPY:  Colonoscopy to screen for colon cancer is recommended for all women at age 69.  We know, you hate the idea of the prep.  We agree, BUT, having colon cancer and not knowing it is worse!!  Colon cancer so often starts as a polyp that can be seen and removed at colonscopy, which can quite literally save your life!  And if your first colonoscopy is normal and you have no family history of colon cancer, most women don't have to have it again for 10 years.  Once every ten years, you can do something that may end up saving your life, right?  We will be happy to help you get it  scheduled when you are ready.  Be sure to check your insurance coverage so you understand how much it will cost.  It may be covered as a preventative service at no cost, but you should check your particular policy.

## 2016-06-20 ENCOUNTER — Telehealth: Payer: Self-pay | Admitting: *Deleted

## 2016-06-20 LAB — WET PREP BY MOLECULAR PROBE
Candida species: NOT DETECTED
Gardnerella vaginalis: DETECTED — AB
Trichomonas vaginosis: NOT DETECTED

## 2016-06-20 MED ORDER — METRONIDAZOLE 0.75 % VA GEL: 1.0000 | Freq: Two times a day (BID) | VAGINAL | 0 refills | Status: DC

## 2016-06-20 NOTE — Telephone Encounter (Signed)
Spoke with patient and gave results. Patient voiced understanding. Sent in RX for Metrogel to patient pharmacy -eh

## 2016-06-20 NOTE — Telephone Encounter (Signed)
-----   Message from Salvadore Dom, MD sent at 06/20/2016 10:26 AM EST ----- Please inform the patient that her vaginitis probe was + for BV and treat with flagyl (either oral or vaginal, her choice), no ETOH while on Flagyl.  Oral: Flagyl 500 mg BID x 7 days, or Vaginal: Metrogel, 1 applicator per vagina q day x 5 days.

## 2016-06-24 LAB — IPS PAP TEST WITH HPV

## 2016-07-07 ENCOUNTER — Other Ambulatory Visit: Payer: Self-pay | Admitting: Family Medicine

## 2016-07-09 NOTE — Telephone Encounter (Signed)
BP Readings from Last 3 Encounters:  06/19/16 (!) 148/90  06/02/16 132/88  12/16/15 (!) 136/100   Blood pressure is not controlled, will recommend office visit with courtesy refill. Last OV with [place] was 12/16/2015.

## 2016-07-25 ENCOUNTER — Ambulatory Visit: Payer: 59

## 2016-07-26 ENCOUNTER — Ambulatory Visit (INDEPENDENT_AMBULATORY_CARE_PROVIDER_SITE_OTHER): Payer: 59 | Admitting: Emergency Medicine

## 2016-07-26 ENCOUNTER — Ambulatory Visit: Payer: 59

## 2016-07-26 VITALS — BP 182/100 | HR 75 | Temp 99.3°F | Resp 18 | Ht 63.0 in | Wt 195.0 lb

## 2016-07-26 DIAGNOSIS — R42 Dizziness and giddiness: Secondary | ICD-10-CM | POA: Diagnosis not present

## 2016-07-26 DIAGNOSIS — F419 Anxiety disorder, unspecified: Secondary | ICD-10-CM

## 2016-07-26 DIAGNOSIS — I10 Essential (primary) hypertension: Secondary | ICD-10-CM

## 2016-07-26 LAB — POCT URINALYSIS DIP (MANUAL ENTRY)
BILIRUBIN UA: NEGATIVE
Glucose, UA: NEGATIVE
Ketones, POC UA: NEGATIVE
LEUKOCYTES UA: NEGATIVE
Nitrite, UA: NEGATIVE
PH UA: 6.5 (ref 5.0–8.0)
Protein Ur, POC: NEGATIVE
SPEC GRAV UA: 1.01 (ref 1.030–1.035)
Urobilinogen, UA: 0.2 (ref ?–2.0)

## 2016-07-26 LAB — POC MICROSCOPIC URINALYSIS (UMFC): Mucus: ABSENT

## 2016-07-26 MED ORDER — VALSARTAN 80 MG PO TABS: 80.0000 mg | ORAL_TABLET | Freq: Every day | ORAL | 5 refills | Status: DC

## 2016-07-26 MED ORDER — AMLODIPINE BESYLATE 10 MG PO TABS: 10.0000 mg | ORAL_TABLET | Freq: Every day | ORAL | 5 refills | Status: DC

## 2016-07-26 MED ORDER — CLONAZEPAM 0.5 MG PO TABS: 0.5000 mg | ORAL_TABLET | Freq: Every day | ORAL | 0 refills | Status: DC | PRN

## 2016-07-26 NOTE — Progress Notes (Signed)
Ann Thornton Signs 41 y.o.   Chief Complaint  Patient presents with  . Dizziness    HISTORY OF PRESENT ILLNESS: This is a 41 y.o. female complaining of feeling dizzy on and off x 1-2 weeks; has h/o HTN and feels like she did when BP was out of control. Denies chest pain, sob; has occassional nausea but no syncope or any other significant symptoms.  Dizziness  This is a new problem. The current episode started 1 to 4 weeks ago. The problem occurs intermittently. The problem has been waxing and waning. Associated symptoms include nausea and vertigo. Pertinent negatives include no abdominal pain, chest pain, chills, congestion, coughing, diaphoresis, fatigue, fever, headaches, neck pain, numbness, rash, sore throat, urinary symptoms, visual change or vomiting.     Prior to Admission medications   Medication Sig Start Date End Date Taking? Authorizing Provider  amLODipine (NORVASC) 10 MG tablet Take 1 tablet (10 mg total) by mouth daily. Office visit needed for additional refills. 1st notice. 07/09/16  Yes Jaynee Eagles, PA-C  clonazePAM (KLONOPIN) 0.5 MG tablet Take 1 tablet (0.5 mg total) by mouth daily as needed for anxiety. 11/23/15  Yes Mancel Bale, PA-C  traZODone (DESYREL) 50 MG tablet Take 1/2 to 1 tablet by  mouth at bedtime as needed  for sleep Patient taking differently: Take 25mg  by mouth at bedtime as needed for sleep 11/20/15  Yes Wardell Honour, MD  metroNIDAZOLE (METROGEL) 0.75 % vaginal gel Place 1 Applicatorful vaginally 2 (two) times daily. Patient not taking: Reported on 07/26/2016 06/20/16   Salvadore Dom, MD  naproxen sodium (ANAPROX) 220 MG tablet Take 440 mg by mouth every 12 (twelve) hours as needed (pain).    Historical Provider, MD    No Known Allergies  Patient Active Problem List   Diagnosis Date Noted  . Insomnia 04/08/2015  . Essential hypertension 03/23/2015  . Anxiety disorder 06/02/2014  . Panic attacks 06/02/2014    Past Medical History:  Diagnosis Date   . Allergy   . Anxiety   . High blood pressure     Past Surgical History:  Procedure Laterality Date  . EYE SURGERY Right 2009   Tightened muscle of eye    Social History   Social History  . Marital status: Married    Spouse name: N/A  . Number of children: N/A  . Years of education: N/A   Occupational History  . 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 Smoking status: Never Smoker  . Smokeless tobacco: Never Used  . Alcohol use 4.2 oz/week    7 Standard drinks or equivalent per week     Comment: Has 1 cocktail every day  . Drug use: No  . Sexual activity: Yes    Birth control/ protection: None   Other Topics Concern  . Not on file   Social History Narrative   Single without children   Working from home   Exercise - zumba once a week, fitness workout on Mondays   Diet - trying to be good, eating no salt.          As of 11/08/15:      Diet: N/A   Caffeine: No Sodas   Married: Engaged   House: Lives in apartment, 2 stories, 1 person    Pets: None   Current/Past profession: Data Entry    Exercise: No    Living Will: No    DNR: No    POA/HPOA: No  Family History  Problem Relation Age of Onset  . Hypertension Mother   . Thyroid disease Sister      Review of Systems  Constitutional: Negative.  Negative for chills, diaphoresis, fatigue, fever and malaise/fatigue.  HENT: Negative.  Negative for congestion, ear pain, hearing loss, nosebleeds, sinus pain, sore throat and tinnitus.   Eyes: Negative.  Negative for blurred vision, double vision, photophobia, discharge and redness.  Respiratory: Negative.  Negative for cough and shortness of breath.   Cardiovascular: Negative.  Negative for chest pain, palpitations, claudication and leg swelling.  Gastrointestinal: Positive for nausea. Negative for abdominal pain, diarrhea and vomiting.  Genitourinary: Negative for dysuria and hematuria.  Musculoskeletal: Negative for neck  pain.  Skin: Negative.  Negative for rash.  Neurological: Positive for dizziness and vertigo. Negative for sensory change, speech change, focal weakness, numbness and headaches.  All other systems reviewed and are negative.   Vitals:   07/26/16 1507  BP: (!) 182/100  Pulse: 75  Resp: 18  Temp: 99.3 F (37.4 C)   In reviewing old and recent records BP has been found to be elevated every time; has been taking Amlodipine for years. Physical Exam  Constitutional: She is oriented to person, place, and time. She appears well-developed and well-nourished.  HENT:  Head: Normocephalic and atraumatic.  Right Ear: External ear normal.  Left Ear: External ear normal.  Nose: Nose normal.  Mouth/Throat: Oropharynx is clear and moist.  Eyes: Conjunctivae and EOM are normal. Pupils are equal, round, and reactive to light.  Neck: Normal range of motion. Neck supple. No JVD present. No thyromegaly present.  Cardiovascular: Normal rate, regular rhythm, normal heart sounds and intact distal pulses.   Pulmonary/Chest: Effort normal and breath sounds normal.  Abdominal: Soft. Bowel sounds are normal. She exhibits no distension. There is no tenderness.  Musculoskeletal: Normal range of motion.  Neurological: She is alert and oriented to person, place, and time. She displays normal reflexes. No cranial nerve deficit or sensory deficit. She exhibits normal muscle tone. Coordination normal.  Skin: Skin is warm and dry. Capillary refill takes less than 2 seconds. No rash noted.  Psychiatric: She has a normal mood and affect. Her behavior is normal.  Vitals reviewed.    ASSESSMENT & PLAN: Continue Amlodipine and start Valsartan as prescribed. Pt also taking Pravachol 40mg  daily.   [name] was seen today for dizziness.  Diagnoses and all orders for this visit:  Dizziness and giddiness -     POCT urinalysis dipstick -     POCT Microscopic Urinalysis ([facility]) -     CBC with Differential/Platelet -      Comprehensive metabolic panel -     Lipid panel  Essential hypertension  Anxiety -     clonazePAM (KLONOPIN) 0.5 MG tablet; Take 1 tablet (0.5 mg total) by mouth daily as needed for anxiety.  Other orders -     valsartan (DIOVAN) 80 MG tablet; Take 1 tablet (80 mg total) by mouth daily. -     amLODipine (NORVASC) 10 MG tablet; Take 1 tablet (10 mg total) by mouth daily. Office visit needed for additional refills. 1st notice.    Patient Instructions       IF you received an x-ray today, you will receive an invoice from [facility]. Please contact [facility] at [phone] with questions or concerns regarding your invoice.   IF you received labwork today, you will receive an invoice from [place]. Please contact [facility] at [phone] with questions or  concerns regarding your invoice.   Our billing staff will not be able to assist you with questions regarding bills from these companies.  You will be contacted with the lab results as soon as they are available. The fastest way to get your results is to activate your My Chart account. Instructions are located on the last page of this paperwork. If you have not heard from [place] regarding the results in 2 weeks, please contact this office.       Hypertension Hypertension is another name for high blood pressure. High blood pressure forces your heart to work harder to pump blood. This can cause problems over time. There are two numbers in a blood pressure reading. There is a top number (systolic) over a bottom number (diastolic). It is best to have a blood pressure below 120/80. Healthy choices can help lower your blood pressure. You may need medicine to help lower your blood pressure if:  Your blood pressure cannot be lowered with healthy choices.  Your blood pressure is higher than 130/80. Follow these instructions at home: Eating and drinking   If directed, follow the DASH eating plan. This diet  includes:  Filling half of your plate at each meal with fruits and vegetables.  Filling one quarter of your plate at each meal with whole grains. Whole grains include whole wheat pasta, brown rice, and whole grain bread.  Eating or drinking low-fat dairy products, such as skim milk or low-fat yogurt.  Filling one quarter of your plate at each meal with low-fat (lean) proteins. Low-fat proteins include fish, skinless chicken, eggs, beans, and tofu.  Avoiding fatty meat, cured and processed meat, or chicken with skin.  Avoiding premade or processed food.  Eat less than 1,500 mg of salt (sodium) a day.  Limit alcohol use to no more than 1 drink a day for nonpregnant women and 2 drinks a day for men. One drink equals 12 oz of beer, 5 oz of wine, or 1 oz of hard liquor. Lifestyle   Work with your doctor to stay at a healthy weight or to lose weight. Ask your doctor what the best weight is for you.  Get at least 30 minutes of exercise that causes your heart to beat faster (aerobic exercise) most days of the week. This may include walking, swimming, or biking.  Get at least 30 minutes of exercise that strengthens your muscles (resistance exercise) at least 3 days a week. This may include lifting weights or pilates.  Do not use any products that contain nicotine or tobacco. This includes cigarettes and e-cigarettes. If you need help quitting, ask your doctor.  Check your blood pressure at home as told by your doctor.  Keep all follow-up visits as told by your doctor. This is important. Medicines   Take over-the-counter and prescription medicines only as told by your doctor. Follow directions carefully.  Do not skip doses of blood pressure medicine. The medicine does not work as well if you skip doses. Skipping doses also puts you at risk for problems.  Ask your doctor about side effects or reactions to medicines that you should watch for. Contact a doctor if:  You think you are having  a reaction to the medicine you are taking.  You have headaches that keep coming back (recurring).  You feel dizzy.  You have swelling in your ankles.  You have trouble with your vision. Get help right away if:  You get a very bad headache.  You start to  feel confused.  You feel weak or numb.  You feel faint.  You get very bad pain in your:  Chest.  Belly (abdomen).  You throw up (vomit) more than once.  You have trouble breathing. Summary  Hypertension is another name for high blood pressure.  Making healthy choices can help lower blood pressure. If your blood pressure cannot be controlled with healthy choices, you may need to take medicine. This information is not intended to replace advice given to you by your health care provider. Make sure you discuss any questions you have with your health care provider. Document Released: 09/25/2007 Document Revised: 03/06/2016 Document Reviewed: 03/06/2016 Elsevier Interactive Patient Education  2017 Elsevier Inc.    Agustina Caroli, MD [facility]

## 2016-07-26 NOTE — Patient Instructions (Addendum)
   IF you received an x-ray today, you will receive an invoice from [facility]. Please contact [facility] at [phone] with questions or concerns regarding your invoice.   IF you received labwork today, you will receive an invoice from [facility]. Please contact [facility] at [phone] with questions or concerns regarding your invoice.   Our billing staff will not be able to assist you with questions regarding bills from these companies.  You will be contacted with the lab results as soon as they are available. The fastest way to get your results is to activate your My Chart account. Instructions are located on the last page of this paperwork. If you have not heard from us regarding the results in 2 weeks, please contact this office.      Hypertension Hypertension is another name for high blood pressure. High blood pressure forces your heart to work harder to pump blood. This can cause problems over time. There are two numbers in a blood pressure reading. There is a top number (systolic) over a bottom number (diastolic). It is best to have a blood pressure below 120/80. Healthy choices can help lower your blood pressure. You may need medicine to help lower your blood pressure if:  Your blood pressure cannot be lowered with healthy choices.  Your blood pressure is higher than 130/80. Follow these instructions at home: Eating and drinking   If directed, follow the DASH eating plan. This diet includes:  Filling half of your plate at each meal with fruits and vegetables.  Filling one quarter of your plate at each meal with whole grains. Whole grains include whole wheat pasta, brown rice, and whole grain bread.  Eating or drinking low-fat dairy products, such as skim milk or low-fat yogurt.  Filling one quarter of your plate at each meal with low-fat (lean) proteins. Low-fat proteins include fish, skinless chicken, eggs, beans, and tofu.  Avoiding fatty meat,  cured and processed meat, or chicken with skin.  Avoiding premade or processed food.  Eat less than 1,500 mg of salt (sodium) a day.  Limit alcohol use to no more than 1 drink a day for nonpregnant women and 2 drinks a day for men. One drink equals 12 oz of beer, 5 oz of wine, or 1 oz of hard liquor. Lifestyle   Work with your doctor to stay at a healthy weight or to lose weight. Ask your doctor what the best weight is for you.  Get at least 30 minutes of exercise that causes your heart to beat faster (aerobic exercise) most days of the week. This may include walking, swimming, or biking.  Get at least 30 minutes of exercise that strengthens your muscles (resistance exercise) at least 3 days a week. This may include lifting weights or pilates.  Do not use any products that contain nicotine or tobacco. This includes cigarettes and e-cigarettes. If you need help quitting, ask your doctor.  Check your blood pressure at home as told by your doctor.  Keep all follow-up visits as told by your doctor. This is important. Medicines   Take over-the-counter and prescription medicines only as told by your doctor. Follow directions carefully.  Do not skip doses of blood pressure medicine. The medicine does not work as well if you skip doses. Skipping doses also puts you at risk for problems.  Ask your doctor about side effects or reactions to medicines that you should watch for. Contact a doctor if:  You think you are having a   reaction to the medicine you are taking.  You have headaches that keep coming back (recurring).  You feel dizzy.  You have swelling in your ankles.  You have trouble with your vision. Get help right away if:  You get a very bad headache.  You start to feel confused.  You feel weak or numb.  You feel faint.  You get very bad pain in your:  Chest.  Belly (abdomen).  You throw up (vomit) more than once.  You have trouble  breathing. Summary  Hypertension is another name for high blood pressure.  Making healthy choices can help lower blood pressure. If your blood pressure cannot be controlled with healthy choices, you may need to take medicine. This information is not intended to replace advice given to you by your health care provider. Make sure you discuss any questions you have with your health care provider. Document Released: 09/25/2007 Document Revised: 03/06/2016 Document Reviewed: 03/06/2016 Elsevier Interactive Patient Education  2017 Elsevier Inc.  

## 2016-07-27 LAB — LIPID PANEL
CHOL/HDL RATIO: 5.1 ratio — AB (ref 0.0–4.4)
Cholesterol, Total: 278 mg/dL — ABNORMAL HIGH (ref 100–199)
HDL: 54 mg/dL (ref >39)
LDL Calculated: 193 mg/dL — ABNORMAL HIGH (ref 0–99)
TRIGLYCERIDES: 157 mg/dL — AB (ref 0–149)
VLDL Cholesterol Cal: 31 mg/dL (ref 5–40)

## 2016-07-27 LAB — CBC WITH DIFFERENTIAL/PLATELET
Basophils Absolute: 0 10*3/uL (ref 0.0–0.2)
Basos: 0 %
EOS (ABSOLUTE): 0.1 10*3/uL (ref 0.0–0.4)
Eos: 1 %
Hematocrit: 35.1 % (ref 34.0–46.6)
Hemoglobin: 12.1 g/dL (ref 11.1–15.9)
IMMATURE GRANULOCYTES: 0 %
Immature Grans (Abs): 0 10*3/uL (ref 0.0–0.1)
Lymphocytes Absolute: 2.5 10*3/uL (ref 0.7–3.1)
Lymphs: 32 %
MCH: 30.9 pg (ref 26.6–33.0)
MCHC: 34.5 g/dL (ref 31.5–35.7)
MCV: 90 fL (ref 79–97)
MONOS ABS: 0.4 10*3/uL (ref 0.1–0.9)
Monocytes: 5 %
Neutrophils Absolute: 4.8 10*3/uL (ref 1.4–7.0)
Neutrophils: 62 %
PLATELETS: 443 10*3/uL — AB (ref 150–379)
RBC: 3.91 x10E6/uL (ref 3.77–5.28)
RDW: 13 % (ref 12.3–15.4)
WBC: 7.7 10*3/uL (ref 3.4–10.8)

## 2016-07-27 LAB — COMPREHENSIVE METABOLIC PANEL
A/G RATIO: 1.4 (ref 1.2–2.2)
ALT: 14 IU/L (ref 0–32)
AST: 13 IU/L (ref 0–40)
Albumin: 4.9 g/dL (ref 3.5–5.5)
Alkaline Phosphatase: 63 IU/L (ref 39–117)
BUN/Creatinine Ratio: 18 (ref 9–23)
BUN: 12 mg/dL (ref 6–24)
Bilirubin Total: 0.2 mg/dL (ref 0.0–1.2)
CO2: 25 mmol/L (ref 18–29)
CREATININE: 0.65 mg/dL (ref 0.57–1.00)
Calcium: 10.3 mg/dL — ABNORMAL HIGH (ref 8.7–10.2)
Chloride: 96 mmol/L (ref 96–106)
GFR calc Af Amer: 128 mL/min/{1.73_m2} (ref >59)
GFR, EST NON AFRICAN AMERICAN: 111 mL/min/{1.73_m2} (ref >59)
Globulin, Total: 3.4 g/dL (ref 1.5–4.5)
Glucose: 97 mg/dL (ref 65–99)
POTASSIUM: 4 mmol/L (ref 3.5–5.2)
Sodium: 137 mmol/L (ref 134–144)
Total Protein: 8.3 g/dL (ref 6.0–8.5)

## 2016-07-31 ENCOUNTER — Encounter: Payer: Self-pay | Admitting: *Deleted

## 2016-08-23 ENCOUNTER — Other Ambulatory Visit: Payer: Self-pay | Admitting: Family Medicine
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
## 2016-08-30 ENCOUNTER — Other Ambulatory Visit: Payer: Self-pay | Admitting: Family Medicine

## 2016-08-30 ENCOUNTER — Telehealth: Payer: Self-pay | Admitting: Emergency Medicine

## 2016-08-30 ENCOUNTER — Other Ambulatory Visit: Payer: Self-pay | Admitting: Emergency Medicine

## 2016-08-30 MED ORDER — AMLODIPINE BESYLATE 10 MG PO TABS: 10.0000 mg | ORAL_TABLET | Freq: Every day | ORAL | 5 refills | Status: DC

## 2016-08-30 NOTE — Telephone Encounter (Signed)
PATIENT CALLED HER PHARMACY A WEEK AGO FOR THEM TO REQUEST A REFILL ON HER AMLODIPINE BESYLATE 10 MG. WHEN I LOOKED IN HER CHART IT LOOKS LIKE THEY ARE SENDING THE REQUEST TO [facility]. NOW THE PATIENT ONLY HAS 3 PILLS LEFT. SHE SAID SHE JUST SAW DR. SAGARDIA IN [place] FOR HER BLOOD PRESSURE AND SHE SHOULD NOT HAVE TO COME INTO THE OFFICE TO BE SEEN. BEST PHONE [phone] ([place]) [place]

## 2016-09-22 ENCOUNTER — Other Ambulatory Visit: Payer: Self-pay | Admitting: Physician Assistant

## 2016-09-22 DIAGNOSIS — F419 Anxiety disorder, unspecified: Secondary | ICD-10-CM

## 2016-09-23 NOTE — Telephone Encounter (Signed)
07/26/16 last ov and refill

## 2016-09-23 NOTE — Telephone Encounter (Signed)
Re-route message to Windell Hummingbird, please.

## 2016-09-23 NOTE — Telephone Encounter (Signed)
I have not seen the patient since 12/16 and you were the last person to give it to her so I am sending it back to you for a decision regarding the refill.

## 2016-09-25 ENCOUNTER — Other Ambulatory Visit: Payer: Self-pay

## 2016-09-25 ENCOUNTER — Telehealth: Payer: Self-pay

## 2016-09-25 NOTE — Telephone Encounter (Signed)
Request denied. Needs an OV with [facility] Hummingbird, who initiated treatment 06/02/14.

## 2016-09-25 NOTE — Telephone Encounter (Signed)
I called pt to verify if she uses [facility] because we rcvd a fax for mail order for her Valsartan. The pt stated she tried to get it filled at her pharmacy on file and they told her no due to insurance wanting her to do 90 day, So she called [facility] and the did an override and gave her a month supply. I advised pt to have [facility] call [facility] and have them transfer the remaining 5 refills to them. Pt verbalized understanding and If that didn't work to call us back.

## 2016-11-20 ENCOUNTER — Telehealth: Payer: Self-pay | Admitting: Emergency Medicine

## 2016-11-20 NOTE — Telephone Encounter (Signed)
Lehen with the pharmacy was calling to see if there was another med that could be called in replace of valsartan (DIOVAN) due to the recall.  Please adv

## 2016-11-21 MED ORDER — LISINOPRIL 20 MG PO TABS: 20.0000 mg | ORAL_TABLET | Freq: Every day | ORAL | 3 refills | Status: DC

## 2016-11-21 NOTE — Telephone Encounter (Signed)
See note below. Please advise.  

## 2016-11-21 NOTE — Telephone Encounter (Signed)
Informed patient valsartan was recalled due to "impurities" as she was not aware, and that prescription has been changed to Lisinopril 20mg . Pt verbalized understanding.

## 2016-11-21 NOTE — Telephone Encounter (Signed)
Switched to Lisinopril 20 mg. Thanks.

## 2016-11-25 ENCOUNTER — Other Ambulatory Visit: Payer: Self-pay

## 2016-11-25 MED ORDER — AMLODIPINE BESYLATE 10 MG PO TABS: 10.0000 mg | ORAL_TABLET | Freq: Every day | ORAL | 5 refills | Status: DC

## 2016-12-04 ENCOUNTER — Encounter: Payer: Self-pay | Admitting: Emergency Medicine

## 2016-12-04 ENCOUNTER — Ambulatory Visit (INDEPENDENT_AMBULATORY_CARE_PROVIDER_SITE_OTHER): Payer: 59 | Admitting: Emergency Medicine

## 2016-12-04 VITALS — BP 150/90 | HR 98 | Temp 98.4°F | Resp 16 | Ht 63.0 in | Wt 193.6 lb

## 2016-12-04 DIAGNOSIS — F419 Anxiety disorder, unspecified: Secondary | ICD-10-CM | POA: Diagnosis not present

## 2016-12-04 DIAGNOSIS — I10 Essential (primary) hypertension: Secondary | ICD-10-CM | POA: Diagnosis not present

## 2016-12-04 MED ORDER — VALSARTAN 80 MG PO TABS: 80.0000 mg | ORAL_TABLET | Freq: Every day | ORAL | 0 refills | Status: DC

## 2016-12-04 MED ORDER — CLONAZEPAM 0.5 MG PO TABS: 0.5000 mg | ORAL_TABLET | Freq: Every day | ORAL | 0 refills | Status: DC | PRN

## 2016-12-04 NOTE — Progress Notes (Signed)
Ann Thornton 41 y.o.   Chief Complaint  Patient presents with  . med check    stopped lisinopril due causing a cough, fast heart rate, ha's    HISTORY OF PRESENT ILLNESS: This is a 41 y.o. female with h/o HTN recently switched from Losartan to Lisinopril but unable to tolerate it due to cough; states she was doing great with Losartan. Also requesting Klonopin refill.  HPI   Prior to Admission medications   Medication Sig Start Date End Date Taking? Authorizing Provider  amLODipine (NORVASC) 10 MG tablet Take 1 tablet (10 mg total) by mouth daily. 11/25/16  Yes Ann Thornton, Ann Bloomer, MD  clonazePAM (KLONOPIN) 0.5 MG tablet Take 1 tablet (0.5 mg total) by mouth daily as needed for anxiety. 07/26/16  Yes Ann Thornton, Ann Bloomer, MD  lisinopril (PRINIVIL,ZESTRIL) 20 MG tablet Take 1 tablet (20 mg total) by mouth daily. 11/21/16  Yes Ann Thornton, Ann Bloomer, MD  naproxen sodium (ANAPROX) 220 MG tablet Take 440 mg by mouth every 12 (twelve) hours as needed (pain).   Yes [provider]  traZODone (DESYREL) 50 MG tablet Take 1/2 to 1 tablet by  mouth at bedtime as needed  for sleep Patient taking differently: Take 25mg  by mouth at bedtime as needed for sleep 11/20/15  Yes Ann Honour, MD  metroNIDAZOLE (METROGEL) 0.75 % vaginal gel Place 1 Applicatorful vaginally 2 (two) times daily. Patient not taking: Reported on 07/26/2016 06/20/16   Ann Dom, MD    Not on File  Patient Active Problem List   Diagnosis Date Noted  . Dizziness and giddiness 07/26/2016  . Insomnia 04/08/2015  . Essential hypertension 03/23/2015  . Anxiety disorder 06/02/2014  . Panic attacks 06/02/2014    Past Medical History:  Diagnosis Date  . Allergy   . Anxiety   . High blood pressure     Past Surgical History:  Procedure Laterality Date  . EYE SURGERY Right 2009   Tightened muscle of eye    Social History   Social History  . Marital status: Married    Spouse name: N/A  . Number of  children: N/A  . Years of education: N/A   Occupational History  . customer service [employer] History Main Topics  . Smoking status: Never Smoker  . Smokeless tobacco: Never Used  . Alcohol use 4.2 oz/week    7 Standard drinks or equivalent per week     Comment: Has 1 cocktail every day  . Drug use: No  . Sexual activity: Yes    Birth control/ protection: None   Other Topics Concern  . Not on file   Social History Narrative   Single without children   Working from home   Exercise - zumba once a week, fitness workout on Mondays   Diet - trying to be good, eating no salt.          As of 11/08/15:      Diet: N/A   Caffeine: No Sodas   Married: Engaged   House: Lives in apartment, 2 stories, 1 person    Pets: None   Current/Past profession: Data Entry    Exercise: No    Living Will: No    DNR: No    POA/HPOA: No           Family History  Problem Relation Age of Onset  . Hypertension Mother   . Thyroid disease Sister      Review of Systems  Constitutional: Negative.  Negative for chills and fever.  HENT: Negative.   Eyes: Negative.   Respiratory: Positive for cough. Negative for shortness of breath.   Cardiovascular: Negative.  Negative for chest pain and palpitations.  Gastrointestinal: Negative for abdominal pain, nausea and vomiting.  Genitourinary: Negative for dysuria and hematuria.  Musculoskeletal: Negative.   Skin: Negative for rash.  Neurological: Negative.  Negative for dizziness and headaches.  Endo/Heme/Allergies: Negative.   All other systems reviewed and are negative.  Vitals:   12/04/16 1700 12/04/16 1720  BP: (!) 155/94 (!) 150/90  Pulse: (!) 121 98  Resp: 16   Temp: 98.4 F (36.9 C)   SpO2: 99%      Physical Exam  Constitutional: She is oriented to person, place, and time. She appears well-developed and well-nourished.  HENT:  Head: Normocephalic and atraumatic.  Nose: Nose normal.  Mouth/Throat: Oropharynx is  clear and moist.  Eyes: Pupils are equal, round, and reactive to light. Conjunctivae and EOM are normal.  Neck: Normal range of motion. Neck supple. No JVD present.  Cardiovascular: Normal rate, regular rhythm, normal heart sounds and intact distal pulses.   Pulmonary/Chest: Effort normal and breath sounds normal.  Abdominal: Soft. Bowel sounds are normal. She exhibits no distension. There is no tenderness.  Musculoskeletal: Normal range of motion.  Lymphadenopathy:    She has no cervical adenopathy.  Neurological: She is alert and oriented to person, place, and time. No sensory deficit. She exhibits normal muscle tone.  Skin: Skin is warm and dry. Capillary refill takes less than 2 seconds.  Psychiatric: She has a normal mood and affect. Her behavior is normal.  Vitals reviewed.    ASSESSMENT & PLAN: Ann Thornton was seen today for med check.  Diagnoses and all orders for this visit:  Essential hypertension -     valsartan (DIOVAN) 80 MG tablet; Take 1 tablet (80 mg total) by mouth daily.  Anxiety -     clonazePAM (KLONOPIN) 0.5 MG tablet; Take 1 tablet (0.5 mg total) by mouth daily as needed for anxiety.  Pros and cons of Klonopin use discussed. Frequent use discouraged and advised to use it sparingly and only when needed. I think abuse potential is low and benefits outweigh the risks, so refill was prescribed.  Patient Instructions       IF you received an x-ray today, you will receive an invoice from [facility]. Please contact [facility] at [phone] with questions or concerns regarding your invoice.   IF you received labwork today, you will receive an invoice from [place]. Please contact [facility] at [phone] with questions or concerns regarding your invoice.   Our billing staff will not be able to assist you with questions regarding bills from these companies.  You will be contacted with the lab results as soon as they are available. The fastest  way to get your results is to activate your My Chart account. Instructions are located on the last page of this paperwork. If you have not heard from [place] regarding the results in 2 weeks, please contact this office.     Hypertension Hypertension is another name for high blood pressure. High blood pressure forces your heart to work harder to pump blood. This can cause problems over time. There are two numbers in a blood pressure reading. There is a top number (systolic) over a bottom number (diastolic). It is best to have a blood pressure below 120/80. Healthy choices can help lower your blood pressure. You may need  medicine to help lower your blood pressure if:  Your blood pressure cannot be lowered with healthy choices.  Your blood pressure is higher than 130/80.  Follow these instructions at home: Eating and drinking  If directed, follow the DASH eating plan. This diet includes: ? Filling half of your plate at each meal with fruits and vegetables. ? Filling one quarter of your plate at each meal with whole grains. Whole grains include whole wheat pasta, brown rice, and whole grain bread. ? Eating or drinking low-fat dairy products, such as skim milk or low-fat yogurt. ? Filling one quarter of your plate at each meal with low-fat (lean) proteins. Low-fat proteins include fish, skinless chicken, eggs, beans, and tofu. ? Avoiding fatty meat, cured and processed meat, or chicken with skin. ? Avoiding premade or processed food.  Eat less than 1,500 mg of salt (sodium) a day.  Limit alcohol use to no more than 1 drink a day for nonpregnant women and 2 drinks a day for men. One drink equals 12 oz of beer, 5 oz of wine, or 1 oz of hard liquor. Lifestyle  Work with your doctor to stay at a healthy weight or to lose weight. Ask your doctor what the best weight is for you.  Get at least 30 minutes of exercise that causes your heart to beat faster (aerobic exercise) most days of the week. This  may include walking, swimming, or biking.  Get at least 30 minutes of exercise that strengthens your muscles (resistance exercise) at least 3 days a week. This may include lifting weights or pilates.  Do not use any products that contain nicotine or tobacco. This includes cigarettes and e-cigarettes. If you need help quitting, ask your doctor.  Check your blood pressure at home as told by your doctor.  Keep all follow-up visits as told by your doctor. This is important. Medicines  Take over-the-counter and prescription medicines only as told by your doctor. Follow directions carefully.  Do not skip doses of blood pressure medicine. The medicine does not work as well if you skip doses. Skipping doses also puts you at risk for problems.  Ask your doctor about side effects or reactions to medicines that you should watch for. Contact a doctor if:  You think you are having a reaction to the medicine you are taking.  You have headaches that keep coming back (recurring).  You feel dizzy.  You have swelling in your ankles.  You have trouble with your vision. Get help right away if:  You get a very bad headache.  You start to feel confused.  You feel weak or numb.  You feel faint.  You get very bad pain in your: ? Chest. ? Belly (abdomen).  You throw up (vomit) more than once.  You have trouble breathing. Summary  Hypertension is another name for high blood pressure.  Making healthy choices can help lower blood pressure. If your blood pressure cannot be controlled with healthy choices, you may need to take medicine. This information is not intended to replace advice given to you by your health care provider. Make sure you discuss any questions you have with your health care provider. Document Released: 09/25/2007 Document Revised: 03/06/2016 Document Reviewed: 03/06/2016 Elsevier Interactive Patient Education  2018 Elsevier Inc.      Agustina Caroli, MD [facility]

## 2016-12-04 NOTE — Patient Instructions (Addendum)
   IF you received an x-ray today, you will receive an invoice from [facility]. Please contact [facility] at [phone] with questions or concerns regarding your invoice.   IF you received labwork today, you will receive an invoice from [facility]. Please contact [facility] at [phone] with questions or concerns regarding your invoice.   Our billing staff will not be able to assist you with questions regarding bills from these companies.  You will be contacted with the lab results as soon as they are available. The fastest way to get your results is to activate your My Chart account. Instructions are located on the last page of this paperwork. If you have not heard from us regarding the results in 2 weeks, please contact this office.     Hypertension Hypertension is another name for high blood pressure. High blood pressure forces your heart to work harder to pump blood. This can cause problems over time. There are two numbers in a blood pressure reading. There is a top number (systolic) over a bottom number (diastolic). It is best to have a blood pressure below 120/80. Healthy choices can help lower your blood pressure. You may need medicine to help lower your blood pressure if:  Your blood pressure cannot be lowered with healthy choices.  Your blood pressure is higher than 130/80.  Follow these instructions at home: Eating and drinking  If directed, follow the DASH eating plan. This diet includes: ? Filling half of your plate at each meal with fruits and vegetables. ? Filling one quarter of your plate at each meal with whole grains. Whole grains include whole wheat pasta, brown rice, and whole grain bread. ? Eating or drinking low-fat dairy products, such as skim milk or low-fat yogurt. ? Filling one quarter of your plate at each meal with low-fat (lean) proteins. Low-fat proteins include fish, skinless chicken, eggs, beans, and tofu. ? Avoiding fatty meat, cured  and processed meat, or chicken with skin. ? Avoiding premade or processed food.  Eat less than 1,500 mg of salt (sodium) a day.  Limit alcohol use to no more than 1 drink a day for nonpregnant women and 2 drinks a day for men. One drink equals 12 oz of beer, 5 oz of wine, or 1 oz of hard liquor. Lifestyle  Work with your doctor to stay at a healthy weight or to lose weight. Ask your doctor what the best weight is for you.  Get at least 30 minutes of exercise that causes your heart to beat faster (aerobic exercise) most days of the week. This may include walking, swimming, or biking.  Get at least 30 minutes of exercise that strengthens your muscles (resistance exercise) at least 3 days a week. This may include lifting weights or pilates.  Do not use any products that contain nicotine or tobacco. This includes cigarettes and e-cigarettes. If you need help quitting, ask your doctor.  Check your blood pressure at home as told by your doctor.  Keep all follow-up visits as told by your doctor. This is important. Medicines  Take over-the-counter and prescription medicines only as told by your doctor. Follow directions carefully.  Do not skip doses of blood pressure medicine. The medicine does not work as well if you skip doses. Skipping doses also puts you at risk for problems.  Ask your doctor about side effects or reactions to medicines that you should watch for. Contact a doctor if:  You think you are having a reaction to the   medicine you are taking.  You have headaches that keep coming back (recurring).  You feel dizzy.  You have swelling in your ankles.  You have trouble with your vision. Get help right away if:  You get a very bad headache.  You start to feel confused.  You feel weak or numb.  You feel faint.  You get very bad pain in your: ? Chest. ? Belly (abdomen).  You throw up (vomit) more than once.  You have trouble breathing. Summary  Hypertension is  another name for high blood pressure.  Making healthy choices can help lower blood pressure. If your blood pressure cannot be controlled with healthy choices, you may need to take medicine. This information is not intended to replace advice given to you by your health care provider. Make sure you discuss any questions you have with your health care provider. Document Released: 09/25/2007 Document Revised: 03/06/2016 Document Reviewed: 03/06/2016 Elsevier Interactive Patient Education  2018 Elsevier Inc.  

## 2016-12-05 ENCOUNTER — Telehealth: Payer: Self-pay | Admitting: Emergency Medicine

## 2016-12-05 ENCOUNTER — Telehealth: Payer: Self-pay | Admitting: *Deleted

## 2016-12-05 NOTE — Telephone Encounter (Signed)
PATIENT STATES SHE SAW DR. SAGARDIA Wednesday (12/04/16) AND HE PRESCRIBED HER TO HAVE VALSARTAN (DIOVAN) 80 MG. SHE USE TO BE ON LISINOPRIL BUT IT MADE HER SICK. WHEN SHE GOT TO THE PHARMACY THEY TOLD HER THAT THE MEDICINE DR. SAGARDIA SENT IN WAS STILL A PART OF THE RECALL. THEY TOLD HER THEY CONTACTED DR. SAGARDIA TO SEE WHAT HE WANTED TO SUBSTITUTE IT WITH BUT THEY HAVE NOT HEARD BACK. BEST PHONE [phone] (CELL) [place]. [place]

## 2016-12-05 NOTE — Telephone Encounter (Signed)
Called [facility] concerning Valsartan removed from market and medication was changed by Dr Mitchel Honour to Avapo 150 mg take daily #90 with 3 RF's. Patient advised.

## 2017-04-04 ENCOUNTER — Other Ambulatory Visit: Payer: Self-pay | Admitting: Emergency Medicine

## 2017-04-09 ENCOUNTER — Other Ambulatory Visit: Payer: Self-pay | Admitting: Emergency Medicine

## 2017-05-05 ENCOUNTER — Encounter: Payer: Self-pay | Admitting: Family Medicine

## 2017-05-05 ENCOUNTER — Other Ambulatory Visit: Payer: Self-pay

## 2017-05-05 ENCOUNTER — Ambulatory Visit (INDEPENDENT_AMBULATORY_CARE_PROVIDER_SITE_OTHER): Payer: 59 | Admitting: Family Medicine

## 2017-05-05 VITALS — BP 122/78 | HR 114 | Temp 99.2°F | Resp 17 | Ht 63.0 in | Wt 191.2 lb

## 2017-05-05 DIAGNOSIS — I10 Essential (primary) hypertension: Secondary | ICD-10-CM | POA: Diagnosis not present

## 2017-05-05 DIAGNOSIS — Z823 Family history of stroke: Secondary | ICD-10-CM

## 2017-05-05 DIAGNOSIS — E782 Mixed hyperlipidemia: Secondary | ICD-10-CM | POA: Diagnosis not present

## 2017-05-05 DIAGNOSIS — R42 Dizziness and giddiness: Secondary | ICD-10-CM

## 2017-05-05 DIAGNOSIS — R011 Cardiac murmur, unspecified: Secondary | ICD-10-CM

## 2017-05-05 DIAGNOSIS — R002 Palpitations: Secondary | ICD-10-CM

## 2017-05-05 LAB — POCT URINALYSIS DIP (MANUAL ENTRY)
BILIRUBIN UA: NEGATIVE mg/dL
Bilirubin, UA: NEGATIVE
Blood, UA: NEGATIVE
Glucose, UA: NEGATIVE mg/dL
LEUKOCYTES UA: NEGATIVE
Nitrite, UA: NEGATIVE
Protein Ur, POC: NEGATIVE mg/dL
SPEC GRAV UA: 1.02 (ref 1.010–1.025)
Urobilinogen, UA: 0.2 E.U./dL
pH, UA: 5.5 (ref 5.0–8.0)

## 2017-05-05 LAB — POCT CBC
GRANULOCYTE PERCENT: 58.8 % (ref 37–80)
HCT, POC: 36.1 % — AB (ref 37.7–47.9)
HEMOGLOBIN: 12.4 g/dL (ref 12.2–16.2)
Lymph, poc: 2.6 (ref 0.6–3.4)
MCH: 32 pg — AB (ref 27–31.2)
MCHC: 34.4 g/dL (ref 31.8–35.4)
MCV: 92.9 fL (ref 80–97)
MID (CBC): 0.2 (ref 0–0.9)
MPV: 8.6 fL (ref 0–99.8)
PLATELET COUNT, POC: 351 10*3/uL (ref 142–424)
POC Granulocyte: 4.1 (ref 2–6.9)
POC LYMPH PERCENT: 37.7 %L (ref 10–50)
POC MID %: 3.5 % (ref 0–12)
RBC: 3.89 M/uL — AB (ref 4.04–5.48)
RDW, POC: 12.3 %
WBC: 6.9 10*3/uL (ref 4.6–10.2)

## 2017-05-05 LAB — POCT URINE PREGNANCY: Preg Test, Ur: NEGATIVE

## 2017-05-05 NOTE — Progress Notes (Signed)
Chief Complaint  Patient presents with  . Nausea    since last thursday after workout, head spinning, nauseous stomach, htn hx and not sure if htn is the cause    HPI  Lightheadedness She reports that she gets occasional lightheadedness Pt reports that she worked out very hard on Thursday She did the recumbent bicycle for an hour, and walking and had to stop because her HR was racing and would not calm down She states that her head is spinning and her stomach is nauseous She denies blurry vision  She has a family history of strokes and hypertension  hypertension Her hypertension has been controlled with amlodipine 10mg  and irbesartan 150mg  She states that she is doing well with those medications and has no side effects She has not history of murmur or thyroid disease  Patient's last menstrual period was 04/05/2017.  The 10-year ASCVD risk score Mikey Bussing DC Brooke Bonito., et al., 2013) is: 1.8%   Values used to calculate the score:     Age: 42 years     Sex: Female     Is Non-Hispanic African American: Yes     Diabetic: No     Tobacco smoker: No     Systolic Blood Pressure: 194 mmHg     Is BP treated: Yes     HDL Cholesterol: 54 mg/dL     Total Cholesterol: 278 mg/dL   Wt Readings from Last 3 Encounters:  05/05/17 191 lb 3.2 oz (86.7 kg)  12/04/16 193 lb 9.6 oz (87.8 kg)  07/26/16 195 lb (88.5 kg)    4 review of systems  Past Medical History:  Diagnosis Date  . Allergy   . Anxiety   . High blood pressure     Current Outpatient Medications  Medication Sig Dispense Refill  . amLODipine (NORVASC) 10 MG tablet Take 1 tablet (10 mg total) by mouth daily. 30 tablet 5  . irbesartan (AVAPRO) 150 MG tablet Take 150 mg by mouth daily.    Marland Kitchen amLODipine (NORVASC) 10 MG tablet TAKE 1 TABLET BY MOUTH  DAILY (Patient not taking: Reported on 05/05/2017) 90 tablet 0  . amLODipine (NORVASC) 10 MG tablet TAKE 1 TABLET BY MOUTH  DAILY (Patient not taking: Reported on 05/05/2017) 90 tablet 1  .  clonazePAM (KLONOPIN) 0.5 MG tablet Take 1 tablet (0.5 mg total) by mouth daily as needed for anxiety. (Patient not taking: Reported on 05/05/2017) 30 tablet 0  . metroNIDAZOLE (METROGEL) 0.75 % vaginal gel Place 1 Applicatorful vaginally 2 (two) times daily. (Patient not taking: Reported on 07/26/2016) 70 g 0  . naproxen sodium (ANAPROX) 220 MG tablet Take 440 mg by mouth every 12 (twelve) hours as needed (pain).    . traZODone (DESYREL) 50 MG tablet Take 1/2 to 1 tablet by  mouth at bedtime as needed  for sleep (Patient not taking: Reported on 05/05/2017) 30 tablet 0  . valsartan (DIOVAN) 80 MG tablet Take 1 tablet (80 mg total) by mouth daily. 90 tablet 0   No current facility-administered medications for this visit.     Allergies: No Known Allergies  Past Surgical History:  Procedure Laterality Date  . EYE SURGERY Right 2009   Tightened muscle of eye    Social History   Socioeconomic History  . Marital status: Married    Spouse name: None  . Number of children: None  . Years of education: None  . Highest education level: None  Social Needs  . Financial resource strain: None  .  Food insecurity - worry: None  . Food insecurity - inability: None  . Transportation needs - medical: None  . Transportation needs - non-medical: None  Occupational History  . Occupation: Research scientist (physical sciences): [employer]  Tobacco Use  . Smoking status: Never Smoker  . Smokeless tobacco: Never Used  Substance and Sexual Activity  . Alcohol use: Yes    Alcohol/week: 4.2 oz    Types: 7 Standard drinks or equivalent per week    Comment: Has 1 cocktail every day  . Drug use: No  . Sexual activity: Yes    Birth control/protection: None  Other Topics Concern  . None  Social History Narrative   Single without children   Working from home   Exercise - zumba once a week, fitness workout on Mondays   Diet - trying to be good, eating no salt.          As of 11/08/15:      Diet: N/A    Caffeine: No Sodas   Married: Engaged   House: Lives in apartment, 2 stories, 1 person    Pets: None   Current/Past profession: Data Entry    Exercise: No    Living Will: No    DNR: No    POA/HPOA: No        Family History  Problem Relation Age of Onset  . Hypertension Mother   . Stroke Mother   . Thyroid disease Sister      Review of Systems  Constitutional: Negative for chills and fever.  Eyes: Negative for blurred vision and double vision.  Respiratory: Negative for cough, shortness of breath and wheezing.   Cardiovascular: Positive for palpitations. Negative for chest pain.  Gastrointestinal: Negative for abdominal pain, nausea and vomiting.  Neurological: Positive for dizziness. Negative for tingling, tremors and headaches.  Psychiatric/Behavioral: Negative for depression. The patient is not nervous/anxious and does not have insomnia.       Objective: Vitals:   05/05/17 1138  BP: 122/78  Pulse: (!) 114  Resp: 17  Temp: 99.2 F (37.3 C)  TempSrc: Oral  SpO2: 99%  Weight: 191 lb 3.2 oz (86.7 kg)  Height: 5\' 3"  (1.6 m)    Physical Exam  Constitutional: She is oriented to person, place, and time. She appears well-developed and well-nourished.  HENT:  Head: Normocephalic and atraumatic.  Eyes: Conjunctivae and EOM are normal.  Neck: Normal range of motion. No thyromegaly present.  Cardiovascular: Normal rate and regular rhythm.  Murmur heard. Murmur heard best at the right sternal border and is systolic  Pulmonary/Chest: Effort normal and breath sounds normal. No stridor. No respiratory distress. She has no wheezes.  Neurological: She is alert and oriented to person, place, and time.  Skin: Skin is warm. Capillary refill takes less than 2 seconds.  Psychiatric: She has a normal mood and affect. Her behavior is normal. Judgment and thought content normal.   Component     Latest Ref Rng & Units 05/05/2017  WBC     4.6 - 10.2 K/uL 6.9  Lymph, poc     0.6 -  3.4 2.6  POC LYMPH PERCENT     10 - 50 %L 37.7  MID (cbc)     0 - 0.9 0.2  POC MID %     0 - 12 %M 3.5  POC Granulocyte     2 - 6.9 4.1  Granulocyte percent     37 - 80 %G 58.8  RBC     4.04 - 5.48 M/uL 3.89 (A)  Hemoglobin     12.2 - 16.2 g/dL 12.4  HCT     37.7 - 47.9 % 36.1 (A)  MCV     80 - 97 fL 92.9  MCH     27 - 31.2 pg 32.0 (A)  MCHC     31.8 - 35.4 g/dL 34.4  RDW, POC     % 12.3  Platelet Count, POC     142 - 424 K/uL 351  MPV     0 - 99.8 fL 8.6   Component     Latest Ref Rng & Units 05/05/2017 05/05/2017        12:34 PM 12:34 PM  Color, UA     yellow yellow   Clarity, UA     clear cloudy (A)   Glucose     negative mg/dL negative   Bilirubin, UA     negative negative negative  Specific Gravity, UA     1.010 - 1.025 1.020   RBC, UA     negative negative   pH, UA     5.0 - 8.0 5.5   Protein, UA     negative mg/dL negative   Urobilinogen, UA     0.2 or 1.0 E.U./dL 0.2   Nitrite, UA     Negative Negative   Leukocytes, UA     Negative Negative    Component     Latest Ref Rng & Units 05/05/2017  Preg Test, Ur     Negative Negative   Component     Latest Ref Rng & Units 07/26/2016  Cholesterol, Total     100 - 199 mg/dL 278 (H)  Triglycerides     0 - 149 mg/dL 157 (H)  HDL Cholesterol     >39 mg/dL 54  VLDL Cholesterol Cal     5 - 40 mg/dL 31  LDL (calc)     0 - 99 mg/dL 193 (H)  Total CHOL/HDL Ratio     0.0 - 4.4 ratio 5.1 (H)    ECG nsr, no pvcs  Assessment and Plan Mintie was seen today for nausea.  Diagnoses and all orders for this visit:  Lightheadedness- will check for underlying hormone issues Continue hydration Follow up with stress test -     EKG 12-Lead -     POCT urine pregnancy -     POCT urinalysis dipstick -     POCT CBC -     TSH + free T4  Palpitations- while ausculatating the patient's heart sounds there was a dramatic increase in HR from 100 to 110 Will check thyroid ecg reassuring -     EKG 12-Lead -      TSH + free T4  Essential hypertension- bp in good range, cpm -     EKG 12-Lead -     Lipid panel  Mixed hyperlipidemia- reviewed previous lipid -     Lipid panel  Family history of stroke  Newly recognized heart murmur- will get stress test and also clear patient for exercise -     ECHOCARDIOGRAM STRESS TEST; Future  Other orders -     Cancel: Flu Vaccine QUAD 36+ mos IM     Christopher Glasscock A Abdulaziz Toman

## 2017-05-05 NOTE — Patient Instructions (Addendum)
IF you received an x-ray today, you will receive an invoice from [facility]. Please contact [facility] at [phone] with questions or concerns regarding your invoice.   IF you received labwork today, you will receive an invoice from [facility]. Please contact [facility] at [phone] with questions or concerns regarding your invoice.   Our billing staff will not be able to assist you with questions regarding bills from these companies.  You will be contacted with the lab results as soon as they are available. The fastest way to get your results is to activate your My Chart account. Instructions are located on the last page of this paperwork. If you have not heard from [place] regarding the results in 2 weeks, please contact this office.    We recommend that you schedule a mammogram for breast cancer screening. Typically, you do not need a referral to do this. Please contact a local imaging center to schedule your mammogram.  [facility] - [phone]  *ask for the Radiology Department [facility]) - [phone] or [phone]  [facility] - [phone] [place] [phone] [facility] - [phone]  *ask for the [facility] - [phone]  *ask for the Radiology Department [facility] - [phone]  *ask for the [facility] - [phone] Heart Murmur A heart murmur is an extra sound that is caused by chaotic blood flow. The murmur can be heard as a "hum" or "whoosh" sound when blood flows through the heart. The heart has four areas called chambers. Valves separate the upper and lower chambers from each other (tricuspid valve and mitral valve) and separate the lower chambers of the heart from pathways that lead away from the heart (aortic valve and pulmonary valve). Normally, the valves open to let blood  flow through or out of your heart, and then they shut to keep the blood from flowing backward. There are two types of heart murmurs:  Innocent murmurs. Most people with this type of heart murmur do not have a heart problem. Many children have innocent heart murmurs. Your health care provider may suggest some basic testing to find out whether your murmur is an innocent murmur. If an innocent heart murmur is found, there is no need for further tests or treatment and no need to restrict activities or stop playing sports.  Abnormal murmurs. These types of murmurs can occur in children and adults. Abnormal murmurs may be a sign of a more serious heart condition, such as a heart defect present at birth (congenital defect) or heart valve disease.  What are the causes? This condition is caused by heart valves that are not working properly. In children, abnormal heart murmurs are typically caused by congenital defects. In adults, abnormal murmurs are usually from heart valve problems caused by disease, infection, or aging. Three types of heart valve defects can cause a murmur:  Regurgitation. This is when blood leaks back through the valve in the wrong direction.  Mitral valve prolapse. This is when the mitral valve of the heart has a loose flap and does not close tightly.  Stenosis. This is when a valve does not open enough and blocks blood flow.  This condition may also be caused by:  Pregnancy.  Fever.  Overactive thyroid gland.  Anemia.  Exercise.  Rapid growth spurts (in children).  What are the signs or symptoms? Innocent murmurs do  not cause symptoms, and many people with abnormal murmurs may or may not have symptoms. If symptoms do develop, they may include:  Shortness of breath.  Blue coloring of the skin, especially on the fingertips.  Chest pain.  Palpitations, or feeling a fluttering or skipped heartbeat.  Fainting.  Persistent cough.  Getting tired much faster than  expected.  Swelling in the abdomen, feet, or ankles.  How is this diagnosed? This condition may be diagnosed during a routine physical or other exam. If your health care provider hears a murmur with a stethoscope, he or she will listen for:  Where the murmur is located in your heart.  How long the murmur lasts (duration).  When the murmur is heard during the heartbeat.  How loud the murmur is. This may help the health care provider figure out what is causing the murmur.  You may be referred to a heart specialist (cardiologist). You may also have other tests, including:  Electrocardiogram (ECG or EKG). This test measures the electrical activity of your heart.  Echocardiogram. This test uses high frequency sound waves to make pictures of your heart.  MRI or chest X-ray.  Cardiac catheterization. This test looks at blood flow through the heart.  For children and adults who have an abnormal heart murmur and want to stay active, it is important to complete testing, review test results, and receive recommendations from your health care provider. If heart disease is present, it may not be safe to play or be active. How is this treated? Heart murmurs themselves do not need treatment. In some cases, a heart murmur may go away on its own. If an underlying problem or disease is causing the murmur, you may need treatment. If treatment is needed, it will depend on the type and severity of the disease or heart problem causing the murmur. Treatment may include:  Medicine.  Surgery.  Dietary and lifestyle changes.  Follow these instructions at home:  Talk with your health care provider before participating in sports or other activities that require a lot of effort and energy (are strenuous).  Learn as much as possible about your condition and any related diseases. Ask your health care provider if you may at risk for any medical emergencies.  Talk with your health care provider about what  symptoms you should look out for.  It is up to you to get your test results. Ask your health care provider, or the department that is doing the test, when your results will be ready.  Keep all follow-up visits as told by your health care provider. This is important. Contact a health care provider if:  You feel light-headed.  You are frequently short of breath.  You feel more tired than usual.  You are having a hard time keeping up with normal activities or fitness routines.  You have swelling in your ankles or feet.  You have chest pain.  You notice that your heart often beats irregularly.  You develop any new symptoms. Get help right away if:  You develop severe chest pain.  You are having trouble breathing.  You have fainting spells.  Your symptoms suddenly get worse. These symptoms may represent a serious problem that is an emergency. Do not wait to see if the symptoms will go away. Get medical help right away. Call your local emergency services (911 in the [place] Do not drive yourself to the hospital. Summary  Normally, the heart valves open to let blood flow through or out  of your heart, and then they shut to keep the blood from flowing backward.  Heart murmur is caused by heart valves that are not working properly.  You may need treatment if an underlying problem or disease is causing the heart murmur. Treatment may include medicine, surgery, or dietary and lifestyle changes.  Talk with your health care provider before participating in sports or other activities that require a lot of effort and energy (are strenuous).  Talk with your health care provider about what symptoms you should watch out for. This information is not intended to replace advice given to you by your health care provider. Make sure you discuss any questions you have with your health care provider. Document Released: 05/16/2004 Document Revised: 03/27/2016 Document Reviewed: 03/27/2016 Elsevier  Interactive Patient Education  [employer].

## 2017-05-06 LAB — LIPID PANEL
CHOL/HDL RATIO: 5.7 ratio — AB (ref 0.0–4.4)
Cholesterol, Total: 238 mg/dL — ABNORMAL HIGH (ref 100–199)
HDL: 42 mg/dL (ref >39)
LDL Calculated: 177 mg/dL — ABNORMAL HIGH (ref 0–99)
Triglycerides: 97 mg/dL (ref 0–149)
VLDL Cholesterol Cal: 19 mg/dL (ref 5–40)

## 2017-05-06 LAB — TSH+FREE T4
FREE T4: 1.3 ng/dL (ref 0.82–1.77)
TSH: 3.06 u[IU]/mL (ref 0.450–4.500)

## 2017-06-23 ENCOUNTER — Encounter: Payer: Self-pay | Admitting: Obstetrics and Gynecology

## 2017-06-23 ENCOUNTER — Other Ambulatory Visit: Payer: Self-pay

## 2017-06-23 ENCOUNTER — Ambulatory Visit (INDEPENDENT_AMBULATORY_CARE_PROVIDER_SITE_OTHER): Payer: 59 | Admitting: Obstetrics and Gynecology

## 2017-06-23 VITALS — BP 158/90 | HR 100 | Resp 16 | Ht 64.75 in | Wt 191.0 lb

## 2017-06-23 DIAGNOSIS — F419 Anxiety disorder, unspecified: Secondary | ICD-10-CM | POA: Diagnosis not present

## 2017-06-23 DIAGNOSIS — Z01419 Encounter for gynecological examination (general) (routine) without abnormal findings: Secondary | ICD-10-CM

## 2017-06-23 MED ORDER — CITALOPRAM HYDROBROMIDE 20 MG PO TABS
ORAL_TABLET | ORAL | 1 refills | Status: DC
Start: 2017-06-23 — End: 2018-09-09

## 2017-06-23 NOTE — Patient Instructions (Signed)

## 2017-06-23 NOTE — Progress Notes (Signed)
42 y.o. G0P0000 MarriedAfrican AmericanF here for annual exam.   She has been having issues with anxiety, has clonazepam but doesn't like to take it. Her Father died in the Fall.  Period Cycle (Days): 28 Period Duration (Days): 4-6 days  Period Pattern: Regular Menstrual Flow: Heavy Menstrual Control: Tampon Menstrual Control Change Freq (Hours): changes tampon 3 times a day Dysmenorrhea: (!) Moderate Dysmenorrhea Symptoms: Cramping  She has a long h/o infertility. Husband has children. She has decided not to pursue a work up. No dyspareunia.  Patient's last menstrual period was 05/27/2017.          Sexually active: Yes.    The current method of family planning is none.    Exercising: Yes.    walking/ AB machine/ leg machine Smoker:  no  Health Maintenance: Pap:  06-19-16 WNL NEG HR HPV History of abnormal Pap:  Yes- in her teens- had a colposcopy  MMG:  01/2017 WNL per patient  Colonoscopy:  never BMD:   Never TDaP:  01-24-15 Gardasil: no    reports that  has never smoked. she has never used smokeless tobacco. She reports that she drinks about 4.2 oz of alcohol per week. She reports that she does not use drugs. 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 Close with her nieces, nephew and step children. From [place], all of her family is here. She may have 2 drinks a day.   Past Medical History:  Diagnosis Date  . Allergy   . Anxiety   . High blood pressure     Past Surgical History:  Procedure Laterality Date  . EYE SURGERY Right 2009   Tightened muscle of eye    Current Outpatient Medications  Medication Sig Dispense Refill  . amLODipine (NORVASC) 10 MG tablet Take 1 tablet (10 mg total) by mouth daily. 30 tablet 5  . irbesartan (AVAPRO) 150 MG tablet Take 150 mg by mouth daily.     No current facility-administered medications for this visit.     Family History  Problem Relation Age of Onset  . Hypertension Mother   . Stroke Mother   . Thyroid disease Sister     Review of  Systems  Constitutional: Negative.   HENT: Negative.   Eyes: Negative.   Respiratory: Negative.   Cardiovascular: Negative.   Gastrointestinal: Negative.   Endocrine: Negative.   Genitourinary: Negative.   Musculoskeletal: Negative.   Skin: Negative.   Allergic/Immunologic: Negative.   Neurological: Negative.   Psychiatric/Behavioral:       Anxiety     Exam:   BP (!) 158/90 (BP Location: Right Arm, Patient Position: Sitting, Cuff Size: Normal)   Pulse 100   Resp 16   Ht 5' 4.75" (1.645 m)   Wt 191 lb (86.6 kg)   LMP 05/27/2017   BMI 32.03 kg/m   Weight change: @WEIGHTCHANGE @ Height:   Height: 5' 4.75" (164.5 cm)  Ht Readings from Last 3 Encounters:  06/23/17 5' 4.75" (1.645 m)  05/05/17 5\' 3"  (1.6 m)  12/04/16 5\' 3"  (1.6 m)    General appearance: alert, cooperative and appears stated age Head: Normocephalic, without obvious abnormality, atraumatic Neck: no adenopathy, supple, symmetrical, trachea midline and thyroid normal to inspection and palpation Lungs: clear to auscultation bilaterally Cardiovascular: regular rate and rhythm Breasts: normal appearance, no masses or tenderness Abdomen: soft, non-tender; non distended,  no masses,  no organomegaly Extremities: extremities normal, atraumatic, no cyanosis or edema Skin: Skin color, texture, turgor normal. No rashes or lesions Lymph nodes: Cervical,  supraclavicular, and axillary nodes normal. No abnormal inguinal nodes palpated Neurologic: Grossly normal   Pelvic: External genitalia:  no lesions              Urethra:  normal appearing urethra with no masses, tenderness or lesions              Bartholins and Skenes: normal                 Vagina: normal appearing vagina with normal color and discharge, no lesions              Cervix: no lesions               Bimanual Exam:  Uterus:  normal size, contour, position, consistency, mobility, non-tender              Adnexa: no mass, fullness, tenderness                Rectovaginal: Confirms               Anus:  normal sphincter tone, no lesions  Chaperone was present for exam.  A:  Well Woman with normal exam  Anxiety  HTN, followed by primary  P:   Start Celexa  F/U in one month  No pap this year  Labs with primary  Discussed breast self exam  Discussed calcium and vit D intake  Normal mammogram in the fall (will get a copy)   Recommended that she cut back to one drink a day

## 2017-07-01 ENCOUNTER — Encounter: Payer: Self-pay | Admitting: Obstetrics and Gynecology

## 2017-07-08 ENCOUNTER — Emergency Department (HOSPITAL_COMMUNITY)
Admission: EM | Admit: 2017-07-08 | Discharge: 2017-07-08 | Disposition: A | Discharge status: Home / Self Care | Payer: 59 | Attending: Emergency Medicine | Admitting: Emergency Medicine

## 2017-07-08 ENCOUNTER — Ambulatory Visit: Payer: Self-pay | Admitting: *Deleted

## 2017-07-08 ENCOUNTER — Other Ambulatory Visit: Payer: Self-pay

## 2017-07-08 ENCOUNTER — Ambulatory Visit: Payer: 59 | Admitting: Family Medicine

## 2017-07-08 DIAGNOSIS — R42 Dizziness and giddiness: Secondary | ICD-10-CM | POA: Insufficient documentation

## 2017-07-08 DIAGNOSIS — I10 Essential (primary) hypertension: Secondary | ICD-10-CM | POA: Diagnosis not present

## 2017-07-08 DIAGNOSIS — Z79899 Other long term (current) drug therapy: Secondary | ICD-10-CM | POA: Insufficient documentation

## 2017-07-08 DIAGNOSIS — R112 Nausea with vomiting, unspecified: Secondary | ICD-10-CM | POA: Diagnosis not present

## 2017-07-08 DIAGNOSIS — R1110 Vomiting, unspecified: Secondary | ICD-10-CM | POA: Diagnosis present

## 2017-07-08 LAB — COMPREHENSIVE METABOLIC PANEL
ALK PHOS: 66 U/L (ref 38–126)
ALT: 15 U/L (ref 14–54)
AST: 17 U/L (ref 15–41)
Albumin: 4.4 g/dL (ref 3.5–5.0)
Anion gap: 8 (ref 5–15)
BUN: 11 mg/dL (ref 6–20)
CALCIUM: 9.5 mg/dL (ref 8.9–10.3)
CHLORIDE: 101 mmol/L (ref 101–111)
CO2: 26 mmol/L (ref 22–32)
CREATININE: 0.69 mg/dL (ref 0.44–1.00)
GFR calc non Af Amer: >60 mL/min (ref >60)
Glucose, Bld: 103 mg/dL — ABNORMAL HIGH (ref 65–99)
Potassium: 3.4 mmol/L — ABNORMAL LOW (ref 3.5–5.1)
SODIUM: 135 mmol/L (ref 135–145)
Total Bilirubin: 0.3 mg/dL (ref 0.3–1.2)
Total Protein: 8.4 g/dL — ABNORMAL HIGH (ref 6.5–8.1)

## 2017-07-08 LAB — CBC
HCT: 38 % (ref 36.0–46.0)
Hemoglobin: 12.5 g/dL (ref 12.0–15.0)
MCH: 31.1 pg (ref 26.0–34.0)
MCHC: 32.9 g/dL (ref 30.0–36.0)
MCV: 94.5 fL (ref 78.0–100.0)
PLATELETS: 453 10*3/uL — AB (ref 150–400)
RBC: 4.02 MIL/uL (ref 3.87–5.11)
RDW: 12.1 % (ref 11.5–15.5)
WBC: 8 10*3/uL (ref 4.0–10.5)

## 2017-07-08 LAB — URINALYSIS, ROUTINE W REFLEX MICROSCOPIC
Bacteria, UA: NONE SEEN
Bilirubin Urine: NEGATIVE
GLUCOSE, UA: NEGATIVE mg/dL
KETONES UR: NEGATIVE mg/dL
Leukocytes, UA: NEGATIVE
Nitrite: NEGATIVE
PH: 6 (ref 5.0–8.0)
Protein, ur: NEGATIVE mg/dL
Specific Gravity, Urine: 1.014 (ref 1.005–1.030)

## 2017-07-08 LAB — I-STAT BETA HCG BLOOD, ED (MC, WL, AP ONLY): I-stat hCG, quantitative: <5 m[IU]/mL (ref <5)

## 2017-07-08 LAB — LIPASE, BLOOD: LIPASE: 24 U/L (ref 11–51)

## 2017-07-08 MED ORDER — ONDANSETRON 4 MG PO TBDP: 4.0000 mg | ORAL_TABLET | Freq: Three times a day (TID) | ORAL | 0 refills | Status: DC | PRN

## 2017-07-08 MED ORDER — LACTATED RINGERS IV BOLUS (SEPSIS)
1000.0000 mL | Freq: Once | INTRAVENOUS | Status: AC
  Administered 2017-07-08: 1000 mL via INTRAVENOUS

## 2017-07-08 NOTE — Discharge Instructions (Signed)
All the results in the ER are normal, labs and imaging. We are not sure what is causing your symptoms. The workup in the ER is not complete, and is limited to screening for life threatening and emergent conditions only, so please see a primary care doctor for further evaluation.  Return to the ER if your symptoms start getting constant or severe.  Return to the ER also if you are unable to keep any medications down. See your doctor in 1 week otherwise.

## 2017-07-08 NOTE — ED Provider Notes (Signed)
[facility] DEPT Provider Note   CSN: [id number] Arrival date & time: 07/08/17  1406     History   Chief Complaint Chief Complaint  Patient presents with  . Emesis    HPI Ann Thornton is a 42 y.o. female.  HPI  42 year old female comes in with chief complaint of dizziness and nausea. Patient has history of anxiety and hypertension.  Patient states that over the past 2 days she has had intermittent episodes of nausea and dizziness.  Dizziness has come about even when patient was sitting.  Dizziness is described as lightheadedness and not spinning sensation.  Patient has had nausea without emesis.  Patient denies any associated chest pain, shortness of breath, abdominal pain, UTI-like symptoms, back pain.  Patient does not smoke or use any illicit substance. Pt has no hx of PE, DVT and denies any exogenous hormone (testosterone / estrogen) use, long distance travels or surgery in the past 6 weeks, active cancer, recent immobilization.   Past Medical History:  Diagnosis Date  . Allergy   . Anxiety   . High blood pressure     Patient Active Problem List   Diagnosis Date Noted  . Anxiety 12/04/2016  . Dizziness and giddiness 07/26/2016  . Insomnia 04/08/2015  . Essential hypertension 03/23/2015  . Anxiety disorder 06/02/2014  . Panic attacks 06/02/2014    Past Surgical History:  Procedure Laterality Date  . EYE SURGERY Right 2009   Tightened muscle of eye    OB History    Gravida Para Term Preterm AB Living   0 0 0 0 0 0   SAB TAB Ectopic Multiple Live Births   0 0 0 0 0       Home Medications    Prior to Admission medications   Medication Sig Start Date End Date Taking? Authorizing Provider  amLODipine (NORVASC) 10 MG tablet Take 1 tablet (10 mg total) by mouth daily. 11/25/16  Yes Sagardia, Ines Bloomer, MD  irbesartan (AVAPRO) 150 MG tablet Take 150 mg by mouth daily.   Yes [provider]  naproxen sodium (ALEVE)  220 MG tablet Take 220 mg by mouth 2 (two) times daily as needed (headache).   Yes [provider]  citalopram (CELEXA) 20 MG tablet 1/2 a tablet a day for one week, if tolerating after one week then increase to one tablet a day Patient not taking: Reported on 07/08/2017 06/23/17   Salvadore Dom, MD  ondansetron (ZOFRAN ODT) 4 MG disintegrating tablet Take 1 tablet (4 mg total) by mouth every 8 (eight) hours as needed for nausea or vomiting. 07/08/17   Varney Biles, MD    Family History Family History  Problem Relation Age of Onset  . Hypertension Mother   . Stroke Mother   . Thyroid disease Sister     Social History Social History   Tobacco Use  . Smoking status: Never Smoker  . Smokeless tobacco: Never Used  Substance Use Topics  . Alcohol use: Yes    Alcohol/week: 4.2 oz    Types: 7 Standard drinks or equivalent per week    Comment: Has 1 cocktail every day  . Drug use: No     Allergies   Patient has no known allergies.   Review of Systems Review of Systems  Constitutional: Positive for activity change.  Respiratory: Negative for shortness of breath.   Cardiovascular: Negative for chest pain.  Gastrointestinal: Positive for nausea.  Allergic/Immunologic: Negative for immunocompromised state.  Neurological:  Positive for dizziness.  Hematological: Does not bruise/bleed easily.  All other systems reviewed and are negative.    Physical Exam Updated Vital Signs BP (!) 137/95   Pulse 91   Temp 98.6 F (37 C) (Oral)   Resp 18   Ht 5' 4.75" (1.645 m)   Wt 86.6 kg (191 lb)   LMP 06/29/2017   SpO2 99%   BMI 32.03 kg/m   Physical Exam  Constitutional: She is oriented to person, place, and time. She appears well-developed.  HENT:  Head: Normocephalic and atraumatic.  Eyes: EOM are normal.  Neck: Normal range of motion. Neck supple.  Cardiovascular: Normal rate.  Pulmonary/Chest: Effort normal.  Abdominal: Bowel sounds are normal.    Musculoskeletal: She exhibits no edema or tenderness.  Neurological: She is alert and oriented to person, place, and time. No cranial nerve deficit.  Cerebellar exam is normal (finger to nose) Sensory exam normal for bilateral upper and lower extremities - and patient is able to discriminate between sharp and dull. Motor exam is 4+/5   Skin: Skin is warm and dry.  Nursing note and vitals reviewed.    ED Treatments / Results  Labs (all labs ordered are listed, but only abnormal results are displayed) Labs Reviewed  COMPREHENSIVE METABOLIC PANEL - Abnormal; Notable for the following components:      Result Value   Potassium 3.4 (*)    Glucose, Bld 103 (*)    Total Protein 8.4 (*)    All other components within normal limits  CBC - Abnormal; Notable for the following components:   Platelets 453 (*)    All other components within normal limits  URINALYSIS, ROUTINE W REFLEX MICROSCOPIC - Abnormal; Notable for the following components:   Hgb urine dipstick SMALL (*)    Squamous Epithelial / LPF 6-30 (*)    All other components within normal limits  LIPASE, BLOOD  I-STAT BETA HCG BLOOD, ED ([facility], [facility], [facility] ONLY)    EKG  EKG Interpretation None       Radiology No results found.  Procedures Procedures (including critical care time)  Medications Ordered in ED Medications  lactated ringers bolus 1,000 mL (0 mLs Intravenous Stopped 07/08/17 2034)     Initial Impression / Assessment and Plan / ED Course  I have reviewed the triage vital signs and the nursing notes.  Pertinent labs & imaging results that were available during my care of the patient were reviewed by me and considered in my medical decision making (see chart for details).  Clinical Course as of Jul 09 2122  Tue Jul 08, 2017  2123 Results from the ER workup discussed with the patient face to face and all questions answered to the best of my ability.  After the fluid bolus patient does feel a lot better.  Heart  rate was 91 on my reassessment. Strict ER return precautions have been discussed with the patient and she will return to the ER if there is any chest pain, shortness of breath, dizziness, fainting.    [AN]    Clinical Course User Index [AN] Varney Biles, MD    42 year old female comes in with chief complaint of nausea and dizziness.  Dizziness is described as lightheadedness and there is no vertigo.  Patient's symptoms are intermittent and there is no other focal neurologic symptoms associated with them.  Patient also denies any headache/neck pain and our clinical suspicion for PE is extremely low.  Patient is noted to be tachycardic, but she  does not appear dry.  We will order orthostasis and give her some IV fluids and reassess.  Patient is immunocompetent and nontoxic-appearing.  Final Clinical Impressions(s) / ED Diagnoses   Final diagnoses:  Non-intractable vomiting with nausea, unspecified vomiting type  Dizziness    ED Discharge Orders        Ordered    ondansetron (ZOFRAN ODT) 4 MG disintegrating tablet  Every 8 hours PRN     07/08/17 2105       Varney Biles, MD 07/08/17 2124

## 2017-07-08 NOTE — ED Triage Notes (Signed)
Pt reports emesis and diarrhea x 1 day, reports consistent nausea and dizziness, also reports palpitation. Hx HTN, denies vision change . Alert and oriented x 4.

## 2017-07-08 NOTE — Telephone Encounter (Signed)
Pt called with c/o being dizzy. She states happens at times not constant. But today she woke up feeling dizzy and, it got better and started again at 11am. Her b/p is 123/73 and HR is 110. Which she states her hr states fast all the time. Yesterday when she had dizziness, her b/p was 134/90 and she was dizzy more often then. Cardiac symptoms discussed with patient and she knows to call back for any other symptoms. Appointment already made for her at [place] [facility] .  Reason for Disposition . [1] MODERATE dizziness (e.g., interferes with normal activities) AND [2] has been evaluated by physician for this  Answer Assessment - Initial Assessment Questions 1. DESCRIPTION: "Describe your dizziness."     lightheaded 2. LIGHTHEADED: "Do you feel lightheaded?" (e.g., somewhat faint, woozy, weak upon standing)     Faint and woozy when standing at one time 3. VERTIGO: "Do you feel like either you or the room is spinning or tilting?" (i.e. vertigo)     no 4. SEVERITY: "How bad is it?"  "Do you feel like you are going to faint?" "Can you stand and walk?"   - MILD - walking normally   - MODERATE - interferes with normal activities (e.g., work, school)    - SEVERE - unable to stand, requires support to walk, feels like passing out now.      mild 5. ONSET:  "When did the dizziness begin?"     Started on Sunday 6. AGGRAVATING FACTORS: "Does anything make it worse?" (e.g., standing, change in head position)     Standing up fast it comes 7. HEART RATE: "Can you tell me your heart rate?" "How many beats in 15 seconds?"  (Note: not all patients can do this)       HR 110 8. CAUSE: "What do you think is causing the dizziness?"     Not sure 9. RECURRENT SYMPTOM: "Have you had dizziness before?" If so, ask: "When was the last time?" "What happened that time?"     The last time sometime last year, changed b/p med and it made her feel better 10. OTHER SYMPTOMS: "Do you have any other symptoms?" (e.g.,  fever, chest pain, vomiting, diarrhea, bleeding)       Nauseated when it happens 11. PREGNANCY: "Is there any chance you are pregnant?" "When was your last menstrual period?"       No, LMP last week  Protocols used: DIZZINESS [facility]

## 2017-07-15 ENCOUNTER — Inpatient Hospital Stay: Payer: 59 | Admitting: Emergency Medicine

## 2017-07-21 ENCOUNTER — Telehealth: Payer: Self-pay | Admitting: Obstetrics and Gynecology

## 2017-07-21 NOTE — Telephone Encounter (Signed)
Patient canceled her upcoming follow up appointment 07/24/17. Patient stated that she stopped taking the medication and no longer needs the recheck.

## 2017-07-24 ENCOUNTER — Ambulatory Visit: Payer: 59 | Admitting: Obstetrics and Gynecology

## 2017-07-30 NOTE — Telephone Encounter (Signed)
Okay to close this encounter? °

## 2017-09-11 ENCOUNTER — Encounter: Payer: Self-pay | Admitting: Family Medicine

## 2017-11-15 ENCOUNTER — Other Ambulatory Visit: Payer: Self-pay | Admitting: Emergency Medicine

## 2017-12-22 ENCOUNTER — Other Ambulatory Visit: Payer: Self-pay | Admitting: Emergency Medicine

## 2017-12-23 NOTE — Telephone Encounter (Signed)
Contacted pt regarding prescription refill; last office visit 05/05/17 and no upcoming visits noted; pt offered and accepted appointment with Dr Agustina Caroli, [address], on 01/05/18 at 1540; she verbalizes understanding.

## 2018-01-05 ENCOUNTER — Ambulatory Visit (INDEPENDENT_AMBULATORY_CARE_PROVIDER_SITE_OTHER): Payer: 59 | Admitting: Emergency Medicine

## 2018-01-05 ENCOUNTER — Other Ambulatory Visit: Payer: Self-pay

## 2018-01-05 ENCOUNTER — Encounter: Payer: Self-pay | Admitting: Emergency Medicine

## 2018-01-05 VITALS — BP 134/85 | HR 90 | Temp 99.1°F | Resp 16 | Ht 62.75 in | Wt 198.8 lb

## 2018-01-05 DIAGNOSIS — I10 Essential (primary) hypertension: Secondary | ICD-10-CM

## 2018-01-05 IMAGING — CR DG CHEST 2V
2 series · 2 of 2 positions shown · non-contrast
Comparison: None.

CLINICAL DATA: Chest pain

EXAM:
CHEST  2 VIEW

[w chest pa]
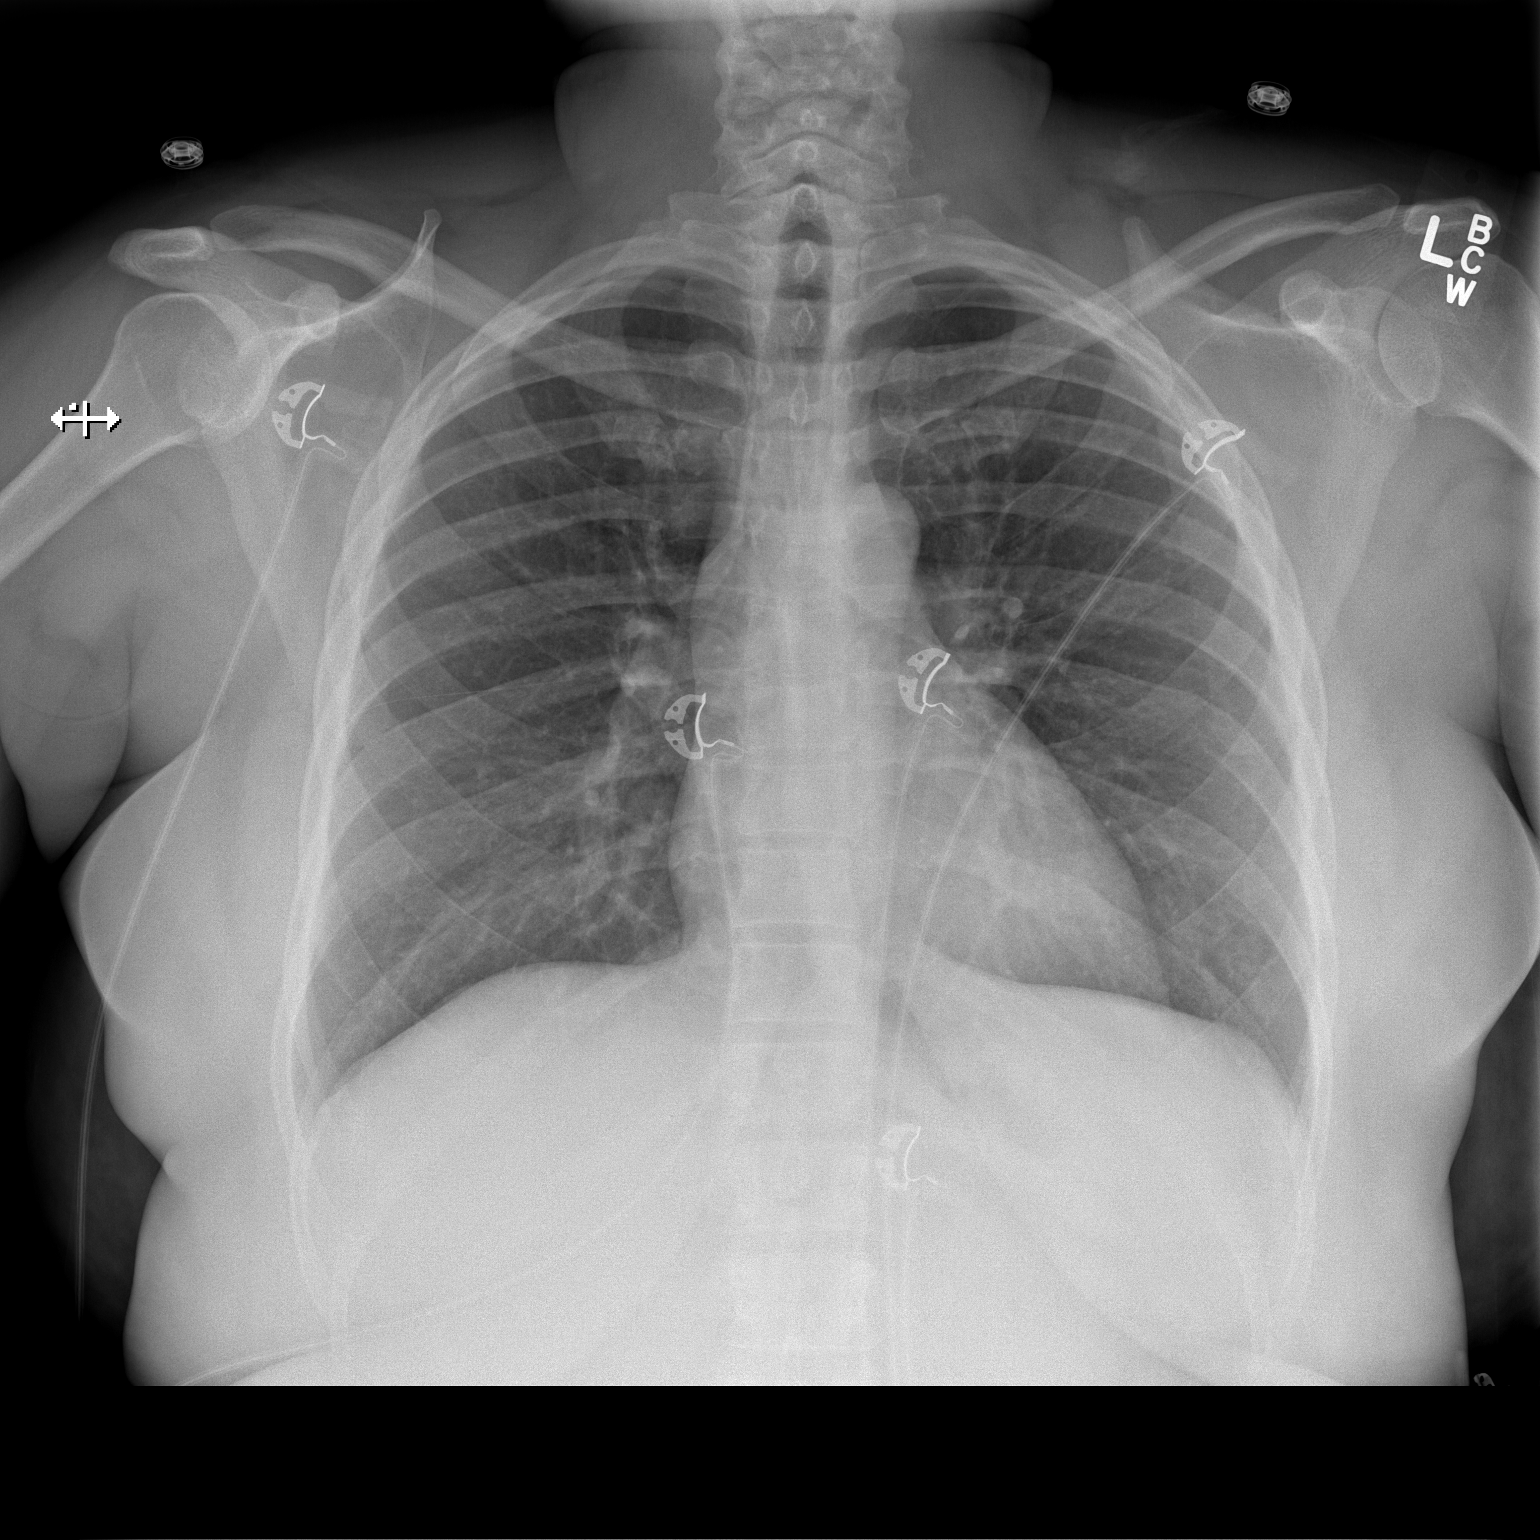

[w chest lat]
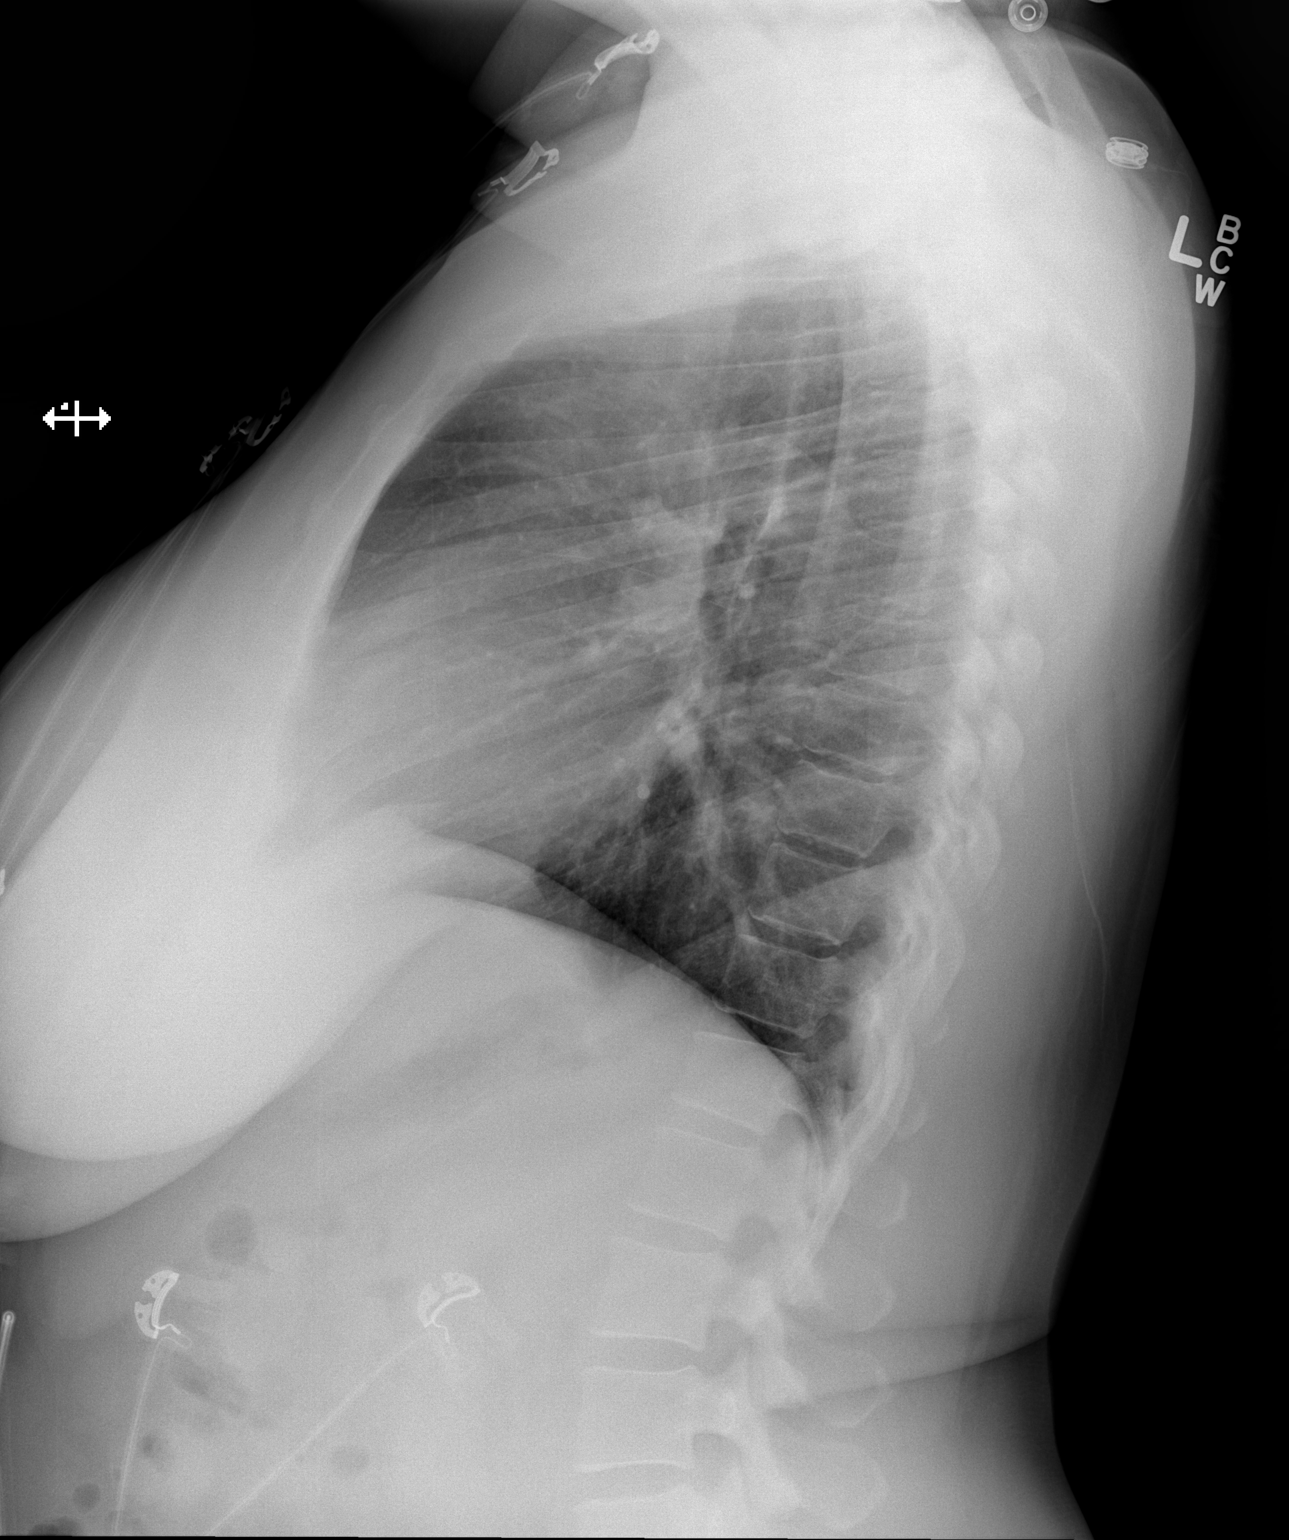

[2 of 2 positions shown; findings below may reference images not displayed]

FINDINGS: Heart and mediastinal contours are within normal limits. No focal
opacities or effusions. No acute bony abnormality.
IMPRESSION: No active cardiopulmonary disease.

## 2018-01-05 MED ORDER — IRBESARTAN 150 MG PO TABS: 150.0000 mg | ORAL_TABLET | Freq: Every day | ORAL | 3 refills | Status: DC

## 2018-01-05 MED ORDER — AMLODIPINE BESYLATE 10 MG PO TABS: 10.0000 mg | ORAL_TABLET | Freq: Every day | ORAL | 3 refills | Status: DC

## 2018-01-05 NOTE — Progress Notes (Signed)
Ann Thornton 42 y.o.   Chief Complaint  Patient presents with  . Medication Refill    amlodipine and Irbesartan    HISTORY OF PRESENT ILLNESS: This is a 42 y.o. female with history of hypertension here for follow-up today.  Needs medication refills.  Compliant with medications and working well.  Has no complaints or medical concerns today.  BP Readings from Last 3 Encounters:  01/05/18 134/85  07/08/17 (!) 120/93  06/23/17 (!) 158/90    HPI   Prior to Admission medications   Medication Sig Start Date End Date Taking? Authorizing Provider  amLODipine (NORVASC) 10 MG tablet Take 1 tablet (10 mg total) by mouth daily. Patient needs office visit for more refills 11/17/17  Yes Nydia Ytuarte, [place], MD  irbesartan (AVAPRO) 150 MG tablet TAKE 1 TABLET BY MOUTH ONCE DAILY 12/23/17  Yes Demetri Goshert, Ines Bloomer, MD  naproxen sodium (ALEVE) 220 MG tablet Take 220 mg by mouth 2 (two) times daily as needed (headache).   Yes [provider]  citalopram (CELEXA) 20 MG tablet 1/2 a tablet a day for one week, if tolerating after one week then increase to one tablet a day Patient not taking: Reported on 07/08/2017 06/23/17   Salvadore Dom, MD  ondansetron (ZOFRAN ODT) 4 MG disintegrating tablet Take 1 tablet (4 mg total) by mouth every 8 (eight) hours as needed for nausea or vomiting. Patient not taking: Reported on 01/05/2018 07/08/17   Varney Biles, MD    No Known Allergies  Patient Active Problem List   Diagnosis Date Noted  . Anxiety 12/04/2016  . Dizziness and giddiness 07/26/2016  . Insomnia 04/08/2015  . Essential hypertension 03/23/2015  . Anxiety disorder 06/02/2014  . Panic attacks 06/02/2014    Past Medical History:  Diagnosis Date  . Allergy   . Anxiety   . High blood pressure     Past Surgical History:  Procedure Laterality Date  . EYE SURGERY Right 2009   Tightened muscle of eye    Social History   Socioeconomic History  . Marital status:  Married    Spouse name: Not on file  . Number of children: Not on file  . Years of education: Not on file  . Highest education level: Not on file  Occupational History  . Occupation: Research scientist (physical sciences): [employer]  . Financial resource strain: Not on file  . Food insecurity:    Worry: Not on file    Inability: Not on file  . Transportation needs:    Medical: Not on file    Non-medical: Not on file  Tobacco Use  . Smoking status: Never Smoker  . Smokeless tobacco: Never Used  Substance and Sexual Activity  . Alcohol use: Yes    Alcohol/week: 7.0 standard drinks    Types: 7 Standard drinks or equivalent per week    Comment: Has 1 cocktail every day  . Drug use: No  . Sexual activity: Yes    Partners: Male    Birth control/protection: None  Lifestyle  . Physical activity:    Days per week: Not on file    Minutes per session: Not on file  . Stress: Not on file  Relationships  . Social connections:    Talks on phone: Not on file    Gets together: Not on file    Attends religious service: Not on file    Active member of club or organization: Not on file  Attends meetings of clubs or organizations: Not on file    Relationship status: Not on file  . Intimate partner violence:    Fear of current or ex partner: Not on file    Emotionally abused: Not on file    Physically abused: Not on file    Forced sexual activity: Not on file  Other Topics Concern  . Not on file  Social History Narrative   Single without children   Working from home   Exercise - zumba once a week, fitness workout on Mondays   Diet - trying to be good, eating no salt.          As of 11/08/15:      Diet: N/A   Caffeine: No Sodas   Married: Engaged   House: Lives in apartment, 2 stories, 1 person    Pets: None   Current/Past profession: Data Entry    Exercise: No    Living Will: No    DNR: No    POA/HPOA: No        Family History  Problem Relation Age of  Onset  . Hypertension Mother   . Stroke Mother   . Thyroid disease Sister      Review of Systems  Constitutional: Negative.  Negative for chills and fever.  HENT: Negative.  Negative for sore throat.   Eyes: Negative.  Negative for blurred vision and double vision.  Respiratory: Negative.  Negative for cough, shortness of breath and wheezing.   Cardiovascular: Negative.  Negative for chest pain and palpitations.  Gastrointestinal: Negative.  Negative for abdominal pain, blood in stool, diarrhea, nausea and vomiting.  Genitourinary: Negative.  Negative for dysuria and urgency.  Musculoskeletal: Negative.  Negative for back pain, myalgias and neck pain.  Skin: Negative.  Negative for rash.  Neurological: Negative.  Negative for dizziness and headaches.  Endo/Heme/Allergies: Negative.   All other systems reviewed and are negative.  Vitals:   01/05/18 1600  BP: 134/85  Pulse: 90  Resp: 16  Temp: 99.1 F (37.3 C)  SpO2: 99%     Physical Exam  Constitutional: She is oriented to person, place, and time. She appears well-developed and well-nourished.  HENT:  Head: Normocephalic and atraumatic.  Nose: Nose normal.  Mouth/Throat: Oropharynx is clear and moist.  Eyes: Pupils are equal, round, and reactive to light. Conjunctivae and EOM are normal.  Neck: Normal range of motion. Neck supple. No JVD present.  Cardiovascular: Normal rate, regular rhythm and normal heart sounds.  Pulmonary/Chest: Effort normal and breath sounds normal.  Abdominal: Soft. Bowel sounds are normal. She exhibits no distension.  Musculoskeletal: Normal range of motion. She exhibits no edema or tenderness.  Lymphadenopathy:    She has no cervical adenopathy.  Neurological: She is alert and oriented to person, place, and time. No sensory deficit. She exhibits normal muscle tone.  Skin: Skin is warm and dry. Capillary refill takes less than 2 seconds.  Psychiatric: She has a normal mood and affect. Her  behavior is normal.  Vitals reviewed.   A total of 25 minutes was spent in the room with the patient, greater than 50% of which was in counseling/coordination of care regarding hypertension, treatment, medications, and need for follow-up.  ASSESSMENT & PLAN: Ann Thornton was seen today for medication refill.  Diagnoses and all orders for this visit:  Essential hypertension  Other orders -     amLODipine (NORVASC) 10 MG tablet; Take 1 tablet (10 mg total) by mouth daily. -  irbesartan (AVAPRO) 150 MG tablet; Take 1 tablet (150 mg total) by mouth daily.    Patient Instructions       If you have lab work done today you will be contacted with your lab results within the next 2 weeks.  If you have not heard from [place] then please contact us. The fastest way to get your results is to register for My Chart.   IF you received an x-ray today, you will receive an invoice from [facility]. Please contact [facility] at [phone] with questions or concerns regarding your invoice.   IF you received labwork today, you will receive an invoice from [facility]. Please contact [facility] at [phone] with questions or concerns regarding your invoice.   Our billing staff will not be able to assist you with questions regarding bills from these companies.  You will be contacted with the lab results as soon as they are available. The fastest way to get your results is to activate your My Chart account. Instructions are located on the last page of this paperwork. If you have not heard from [place] regarding the results in 2 weeks, please contact this office.     Hypertension Hypertension, commonly called high blood pressure, is when the force of blood pumping through the arteries is too strong. The arteries are the blood vessels that carry blood from the heart throughout the body. Hypertension forces the heart to work harder to pump blood and may cause arteries to become narrow or stiff.  Having untreated or uncontrolled hypertension can cause heart attacks, strokes, kidney disease, and other problems. A blood pressure reading consists of a higher number over a lower number. Ideally, your blood pressure should be below 120/80. The first ("top") number is called the systolic pressure. It is a measure of the pressure in your arteries as your heart beats. The second ("bottom") number is called the diastolic pressure. It is a measure of the pressure in your arteries as the heart relaxes. What are the causes? The cause of this condition is not known. What increases the risk? Some risk factors for high blood pressure are under your control. Others are not. Factors you can change  Smoking.  Having type 2 diabetes mellitus, high cholesterol, or both.  Not getting enough exercise or physical activity.  Being overweight.  Having too much fat, sugar, calories, or salt (sodium) in your diet.  Drinking too much alcohol. Factors that are difficult or impossible to change  Having chronic kidney disease.  Having a family history of high blood pressure.  Age. Risk increases with age.  Race. You may be at higher risk if you are African-American.  Gender. Men are at higher risk than women before age 32. After age 61, women are at higher risk than men.  Having obstructive sleep apnea.  Stress. What are the signs or symptoms? Extremely high blood pressure (hypertensive crisis) may cause:  Headache.  Anxiety.  Shortness of breath.  Nosebleed.  Nausea and vomiting.  Severe chest pain.  Jerky movements you cannot control (seizures).  How is this diagnosed? This condition is diagnosed by measuring your blood pressure while you are seated, with your arm resting on a surface. The cuff of the blood pressure monitor will be placed directly against the skin of your upper arm at the level of your heart. It should be measured at least twice using the same arm. Certain conditions  can cause a difference in blood pressure between your right and left arms.  Certain factors can cause blood pressure readings to be lower or higher than normal (elevated) for a short period of time:  When your blood pressure is higher when you are in a health care provider's office than when you are at home, this is called white coat hypertension. Most people with this condition do not need medicines.  When your blood pressure is higher at home than when you are in a health care provider's office, this is called masked hypertension. Most people with this condition may need medicines to control blood pressure.  If you have a high blood pressure reading during one visit or you have normal blood pressure with other risk factors:  You may be asked to return on a different day to have your blood pressure checked again.  You may be asked to monitor your blood pressure at home for 1 week or longer.  If you are diagnosed with hypertension, you may have other blood or imaging tests to help your health care provider understand your overall risk for other conditions. How is this treated? This condition is treated by making healthy lifestyle changes, such as eating healthy foods, exercising more, and reducing your alcohol intake. Your health care provider may prescribe medicine if lifestyle changes are not enough to get your blood pressure under control, and if:  Your systolic blood pressure is above 130.  Your diastolic blood pressure is above 80.  Your personal target blood pressure may vary depending on your medical conditions, your age, and other factors. Follow these instructions at home: Eating and drinking  Eat a diet that is high in fiber and potassium, and low in sodium, added sugar, and fat. An example eating plan is called the DASH (Dietary Approaches to Stop Hypertension) diet. To eat this way: ? Eat plenty of fresh fruits and vegetables. Try to fill half of your plate at each meal with  fruits and vegetables. ? Eat whole grains, such as whole wheat pasta, brown rice, or whole grain bread. Fill about one quarter of your plate with whole grains. ? Eat or drink low-fat dairy products, such as skim milk or low-fat yogurt. ? Avoid fatty cuts of meat, processed or cured meats, and poultry with skin. Fill about one quarter of your plate with lean proteins, such as fish, chicken without skin, beans, eggs, and tofu. ? Avoid premade and processed foods. These tend to be higher in sodium, added sugar, and fat.  Reduce your daily sodium intake. Most people with hypertension should eat less than 1,500 mg of sodium a day.  Limit alcohol intake to no more than 1 drink a day for nonpregnant women and 2 drinks a day for men. One drink equals 12 oz of beer, 5 oz of wine, or 1 oz of hard liquor. Lifestyle  Work with your health care provider to maintain a healthy body weight or to lose weight. Ask what an ideal weight is for you.  Get at least 30 minutes of exercise that causes your heart to beat faster (aerobic exercise) most days of the week. Activities may include walking, swimming, or biking.  Include exercise to strengthen your muscles (resistance exercise), such as pilates or lifting weights, as part of your weekly exercise routine. Try to do these types of exercises for 30 minutes at least 3 days a week.  Do not use any products that contain nicotine or tobacco, such as cigarettes and e-cigarettes. If you need help quitting, ask your health care provider.  Monitor your blood  pressure at home as told by your health care provider.  Keep all follow-up visits as told by your health care provider. This is important. Medicines  Take over-the-counter and prescription medicines only as told by your health care provider. Follow directions carefully. Blood pressure medicines must be taken as prescribed.  Do not skip doses of blood pressure medicine. Doing this puts you at risk for problems and  can make the medicine less effective.  Ask your health care provider about side effects or reactions to medicines that you should watch for. Contact a health care provider if:  You think you are having a reaction to a medicine you are taking.  You have headaches that keep coming back (recurring).  You feel dizzy.  You have swelling in your ankles.  You have trouble with your vision. Get help right away if:  You develop a severe headache or confusion.  You have unusual weakness or numbness.  You feel faint.  You have severe pain in your chest or abdomen.  You vomit repeatedly.  You have trouble breathing. Summary  Hypertension is when the force of blood pumping through your arteries is too strong. If this condition is not controlled, it may put you at risk for serious complications.  Your personal target blood pressure may vary depending on your medical conditions, your age, and other factors. For most people, a normal blood pressure is less than 120/80.  Hypertension is treated with lifestyle changes, medicines, or a combination of both. Lifestyle changes include weight loss, eating a healthy, low-sodium diet, exercising more, and limiting alcohol. This information is not intended to replace advice given to you by your health care provider. Make sure you discuss any questions you have with your health care provider. Document Released: 04/08/2005 Document Revised: 03/06/2016 Document Reviewed: 03/06/2016 Elsevier Interactive Patient Education  2018 Elsevier Inc.      Agustina Caroli, MD [facility]

## 2018-01-05 NOTE — Patient Instructions (Addendum)
   If you have lab work done today you will be contacted with your lab results within the next 2 weeks.  If you have not heard from us then please contact us. The fastest way to get your results is to register for My Chart.   IF you received an x-ray today, you will receive an invoice from [facility]. Please contact [facility] at [phone] with questions or concerns regarding your invoice.   IF you received labwork today, you will receive an invoice from [facility]. Please contact [facility] at [phone] with questions or concerns regarding your invoice.   Our billing staff will not be able to assist you with questions regarding bills from these companies.  You will be contacted with the lab results as soon as they are available. The fastest way to get your results is to activate your My Chart account. Instructions are located on the last page of this paperwork. If you have not heard from us regarding the results in 2 weeks, please contact this office.     Hypertension Hypertension, commonly called high blood pressure, is when the force of blood pumping through the arteries is too strong. The arteries are the blood vessels that carry blood from the heart throughout the body. Hypertension forces the heart to work harder to pump blood and may cause arteries to become narrow or stiff. Having untreated or uncontrolled hypertension can cause heart attacks, strokes, kidney disease, and other problems. A blood pressure reading consists of a higher number over a lower number. Ideally, your blood pressure should be below 120/80. The first ("top") number is called the systolic pressure. It is a measure of the pressure in your arteries as your heart beats. The second ("bottom") number is called the diastolic pressure. It is a measure of the pressure in your arteries as the heart relaxes. What are the causes? The cause of this condition is not known. What increases the risk? Some  risk factors for high blood pressure are under your control. Others are not. Factors you can change  Smoking.  Having type 2 diabetes mellitus, high cholesterol, or both.  Not getting enough exercise or physical activity.  Being overweight.  Having too much fat, sugar, calories, or salt (sodium) in your diet.  Drinking too much alcohol. Factors that are difficult or impossible to change  Having chronic kidney disease.  Having a family history of high blood pressure.  Age. Risk increases with age.  Race. You may be at higher risk if you are African-American.  Gender. Men are at higher risk than women before age 45. After age 65, women are at higher risk than men.  Having obstructive sleep apnea.  Stress. What are the signs or symptoms? Extremely high blood pressure (hypertensive crisis) may cause:  Headache.  Anxiety.  Shortness of breath.  Nosebleed.  Nausea and vomiting.  Severe chest pain.  Jerky movements you cannot control (seizures).  How is this diagnosed? This condition is diagnosed by measuring your blood pressure while you are seated, with your arm resting on a surface. The cuff of the blood pressure monitor will be placed directly against the skin of your upper arm at the level of your heart. It should be measured at least twice using the same arm. Certain conditions can cause a difference in blood pressure between your right and left arms. Certain factors can cause blood pressure readings to be lower or higher than normal (elevated) for a short period of time:  When   your blood pressure is higher when you are in a health care provider's office than when you are at home, this is called white coat hypertension. Most people with this condition do not need medicines.  When your blood pressure is higher at home than when you are in a health care provider's office, this is called masked hypertension. Most people with this condition may need medicines to control  blood pressure.  If you have a high blood pressure reading during one visit or you have normal blood pressure with other risk factors:  You may be asked to return on a different day to have your blood pressure checked again.  You may be asked to monitor your blood pressure at home for 1 week or longer.  If you are diagnosed with hypertension, you may have other blood or imaging tests to help your health care provider understand your overall risk for other conditions. How is this treated? This condition is treated by making healthy lifestyle changes, such as eating healthy foods, exercising more, and reducing your alcohol intake. Your health care provider may prescribe medicine if lifestyle changes are not enough to get your blood pressure under control, and if:  Your systolic blood pressure is above 130.  Your diastolic blood pressure is above 80.  Your personal target blood pressure may vary depending on your medical conditions, your age, and other factors. Follow these instructions at home: Eating and drinking  Eat a diet that is high in fiber and potassium, and low in sodium, added sugar, and fat. An example eating plan is called the DASH (Dietary Approaches to Stop Hypertension) diet. To eat this way: ? Eat plenty of fresh fruits and vegetables. Try to fill half of your plate at each meal with fruits and vegetables. ? Eat whole grains, such as whole wheat pasta, brown rice, or whole grain bread. Fill about one quarter of your plate with whole grains. ? Eat or drink low-fat dairy products, such as skim milk or low-fat yogurt. ? Avoid fatty cuts of meat, processed or cured meats, and poultry with skin. Fill about one quarter of your plate with lean proteins, such as fish, chicken without skin, beans, eggs, and tofu. ? Avoid premade and processed foods. These tend to be higher in sodium, added sugar, and fat.  Reduce your daily sodium intake. Most people with hypertension should eat less  than 1,500 mg of sodium a day.  Limit alcohol intake to no more than 1 drink a day for nonpregnant women and 2 drinks a day for men. One drink equals 12 oz of beer, 5 oz of wine, or 1 oz of hard liquor. Lifestyle  Work with your health care provider to maintain a healthy body weight or to lose weight. Ask what an ideal weight is for you.  Get at least 30 minutes of exercise that causes your heart to beat faster (aerobic exercise) most days of the week. Activities may include walking, swimming, or biking.  Include exercise to strengthen your muscles (resistance exercise), such as pilates or lifting weights, as part of your weekly exercise routine. Try to do these types of exercises for 30 minutes at least 3 days a week.  Do not use any products that contain nicotine or tobacco, such as cigarettes and e-cigarettes. If you need help quitting, ask your health care provider.  Monitor your blood pressure at home as told by your health care provider.  Keep all follow-up visits as told by your health care provider.   This is important. Medicines  Take over-the-counter and prescription medicines only as told by your health care provider. Follow directions carefully. Blood pressure medicines must be taken as prescribed.  Do not skip doses of blood pressure medicine. Doing this puts you at risk for problems and can make the medicine less effective.  Ask your health care provider about side effects or reactions to medicines that you should watch for. Contact a health care provider if:  You think you are having a reaction to a medicine you are taking.  You have headaches that keep coming back (recurring).  You feel dizzy.  You have swelling in your ankles.  You have trouble with your vision. Get help right away if:  You develop a severe headache or confusion.  You have unusual weakness or numbness.  You feel faint.  You have severe pain in your chest or abdomen.  You vomit  repeatedly.  You have trouble breathing. Summary  Hypertension is when the force of blood pumping through your arteries is too strong. If this condition is not controlled, it may put you at risk for serious complications.  Your personal target blood pressure may vary depending on your medical conditions, your age, and other factors. For most people, a normal blood pressure is less than 120/80.  Hypertension is treated with lifestyle changes, medicines, or a combination of both. Lifestyle changes include weight loss, eating a healthy, low-sodium diet, exercising more, and limiting alcohol. This information is not intended to replace advice given to you by your health care provider. Make sure you discuss any questions you have with your health care provider. Document Released: 04/08/2005 Document Revised: 03/06/2016 Document Reviewed: 03/06/2016 Elsevier Interactive Patient Education  2018 [employer] Inc.  

## 2018-01-16 ENCOUNTER — Encounter: Payer: Self-pay | Admitting: Emergency Medicine

## 2018-05-19 ENCOUNTER — Encounter: Payer: Self-pay | Admitting: Family Medicine

## 2018-05-19 ENCOUNTER — Ambulatory Visit (INDEPENDENT_AMBULATORY_CARE_PROVIDER_SITE_OTHER): Payer: 59 | Admitting: Family Medicine

## 2018-05-19 VITALS — BP 118/80 | HR 118 | Temp 98.9°F | Resp 17 | Ht 63.0 in | Wt 194.0 lb

## 2018-05-19 DIAGNOSIS — J069 Acute upper respiratory infection, unspecified: Secondary | ICD-10-CM

## 2018-05-19 MED ORDER — GUAIFENESIN-CODEINE 100-10 MG/5ML PO SOLN: 5.0000 mL | Freq: Every evening | ORAL | 0 refills | Status: DC | PRN

## 2018-05-19 MED ORDER — BENZONATATE 100 MG PO CAPS: 100.0000 mg | ORAL_CAPSULE | Freq: Three times a day (TID) | ORAL | 0 refills | Status: DC | PRN

## 2018-05-19 NOTE — Progress Notes (Signed)
1/28/202010:09 AM  Ann Thornton Dec 19, 1975, 43 y.o. female [id number]  Chief Complaint  Patient presents with  . Cough    HPI:   Patient is a 43 y.o. female with past medical history significant for HTN who presents today for cough  About a week ago she got sick Started with chest cold, nasal congestion Had fever first day Her nephew was diagnosed with the flu Taking robitussin cold and flu, nyquil at night Coughing thru this meds Not sleeping well No SOB Eating and drinking ok Does not smoke No asthma  Fall Risk  05/19/2018 01/05/2018 05/05/2017 12/04/2016 07/26/2016  Falls in the past year? 0 No No No No     Depression screen [facility] 2/9 05/19/2018 01/05/2018 05/05/2017  Decreased Interest 0 0 0  Down, Depressed, Hopeless 0 0 0  PHQ - 2 Score 0 0 0  Altered sleeping 0 - -  Tired, decreased energy 0 - -  Change in appetite 0 - -  Feeling bad or failure about yourself  0 - -  Trouble concentrating 0 - -  Moving slowly or fidgety/restless 0 - -  Suicidal thoughts 0 - -  PHQ-9 Score 0 - -  Difficult doing work/chores Not difficult at all - -    No Known Allergies  Prior to Admission medications   Medication Sig Start Date End Date Taking? Authorizing Provider  amLODipine (NORVASC) 10 MG tablet Take 1 tablet (10 mg total) by mouth daily. 01/05/18 05/19/18 Yes Sagardia, Ines Bloomer, MD  irbesartan (AVAPRO) 150 MG tablet Take 1 tablet (150 mg total) by mouth daily. 01/05/18 05/19/18 Yes Sagardia, Ines Bloomer, MD  citalopram (CELEXA) 20 MG tablet 1/2 a tablet a day for one week, if tolerating after one week then increase to one tablet a day Patient not taking: Reported on 07/08/2017 06/23/17   Salvadore Dom, MD    Past Medical History:  Diagnosis Date  . Allergy   . Anxiety   . High blood pressure     Past Surgical History:  Procedure Laterality Date  . EYE SURGERY Right 2009   Tightened muscle of eye    Social History   Tobacco Use  . Smoking status: Never  Smoker  . Smokeless tobacco: Never Used  Substance Use Topics  . Alcohol use: Yes    Alcohol/week: 7.0 standard drinks    Types: 7 Standard drinks or equivalent per week    Comment: Has 1 cocktail every day    Family History  Problem Relation Age of Onset  . Hypertension Mother   . Stroke Mother   . Thyroid disease Sister     ROS Per hpi  OBJECTIVE:  Blood pressure 118/80, pulse (!) 118, temperature 98.9 F (37.2 C), temperature source Oral, resp. rate 17, height 5\' 3"  (1.6 m), weight 194 lb (88 kg), last menstrual period 03/23/2018, SpO2 98 %. Body mass index is 34.37 kg/m.   Physical Exam Vitals signs and nursing note reviewed.  Constitutional:      Appearance: She is well-developed.  HENT:     Head: Normocephalic and atraumatic.     Right Ear: Hearing, tympanic membrane, ear canal and external ear normal.     Left Ear: Hearing, tympanic membrane, ear canal and external ear normal.  Eyes:     Conjunctiva/sclera: Conjunctivae normal.     Pupils: Pupils are equal, round, and reactive to light.  Neck:     Musculoskeletal: Neck supple.  Cardiovascular:     Rate and  Rhythm: Normal rate and regular rhythm.     Heart sounds: Normal heart sounds. No murmur. No friction rub. No gallop.   Pulmonary:     Effort: Pulmonary effort is normal.     Breath sounds: Normal breath sounds. No wheezing or rales.  Lymphadenopathy:     Cervical: No cervical adenopathy.  Skin:    General: Skin is warm and dry.  Neurological:     Mental Status: She is alert and oriented to person, place, and time.     ASSESSMENT and PLAN  1. URI with cough and congestion Discussed supportive measures, new meds r/se/b and RTC precautions. Patient educational handout given.  Other orders - benzonatate (TESSALON) 100 MG capsule; Take 1-2 capsules (100-200 mg total) by mouth 3 (three) times daily as needed for cough. - guaiFENesin-codeine 100-10 MG/5ML syrup; Take 5 mLs by mouth at bedtime as needed  for cough.  Return if symptoms worsen or fail to improve.    Rutherford Guys, MD [facility] [place], [place] [address] Ph.  [phone] Fax [phone]

## 2018-05-19 NOTE — Patient Instructions (Addendum)
   Cough, Adult  A cough helps to clear your throat and lungs. A cough may last only 2-3 weeks (acute), or it may last longer than 8 weeks (chronic). Many different things can cause a cough. A cough may be a sign of an illness or another medical condition. Follow these instructions at home:  Pay attention to any changes in your cough.  Take medicines only as told by your doctor. ? If you were prescribed an antibiotic medicine, take it as told by your doctor. Do not stop taking it even if you start to feel better. ? Talk with your doctor before you try using a cough medicine.  Drink enough fluid to keep your pee (urine) clear or pale yellow.  If the air is dry, use a cold steam vaporizer or humidifier in your home.  Stay away from things that make you cough at work or at home.  If your cough is worse at night, try using extra pillows to raise your head up higher while you sleep.  Do not smoke, and try not to be around smoke. If you need help quitting, ask your doctor.  Do not have caffeine.  Do not drink alcohol.  Rest as needed. Contact a doctor if:  You have new problems (symptoms).  You cough up yellow fluid (pus).  Your cough does not get better after 2-3 weeks, or your cough gets worse.  Medicine does not help your cough and you are not sleeping well.  You have pain that gets worse or pain that is not helped with medicine.  You have a fever.  You are losing weight and you do not know why.  You have night sweats. Get help right away if:  You cough up blood.  You have trouble breathing.  Your heartbeat is very fast. This information is not intended to replace advice given to you by your health care provider. Make sure you discuss any questions you have with your health care provider. Document Released: 12/20/2010 Document Revised: 09/14/2015 Document Reviewed: 06/15/2014 Elsevier Interactive Patient Education  [employer].    If you have lab work done  today you will be contacted with your lab results within the next 2 weeks.  If you have not heard from [place] then please contact us. The fastest way to get your results is to register for My Chart.   IF you received an x-ray today, you will receive an invoice from [facility]. Please contact [facility] at [phone] with questions or concerns regarding your invoice.   IF you received labwork today, you will receive an invoice from [place]. Please contact [facility] at [phone] with questions or concerns regarding your invoice.   Our billing staff will not be able to assist you with questions regarding bills from these companies.  You will be contacted with the lab results as soon as they are available. The fastest way to get your results is to activate your My Chart account. Instructions are located on the last page of this paperwork. If you have not heard from [place] regarding the results in 2 weeks, please contact this office.

## 2018-07-07 ENCOUNTER — Other Ambulatory Visit: Payer: Self-pay

## 2018-07-07 ENCOUNTER — Encounter: Payer: Self-pay | Admitting: Emergency Medicine

## 2018-07-07 ENCOUNTER — Ambulatory Visit (INDEPENDENT_AMBULATORY_CARE_PROVIDER_SITE_OTHER): Payer: 59 | Admitting: Emergency Medicine

## 2018-07-07 VITALS — BP 136/82 | HR 121 | Temp 98.1°F | Resp 17 | Ht 63.0 in | Wt 201.0 lb

## 2018-07-07 DIAGNOSIS — I10 Essential (primary) hypertension: Secondary | ICD-10-CM | POA: Diagnosis not present

## 2018-07-07 DIAGNOSIS — E782 Mixed hyperlipidemia: Secondary | ICD-10-CM | POA: Diagnosis not present

## 2018-07-07 NOTE — Patient Instructions (Addendum)
   If you have lab work done today you will be contacted with your lab results within the next 2 weeks.  If you have not heard from us then please contact us. The fastest way to get your results is to register for My Chart.   IF you received an x-ray today, you will receive an invoice from [facility]. Please contact  Radiology at [phone] with questions or concerns regarding your invoice.   IF you received labwork today, you will receive an invoice from [facility]. Please contact [facility] at [phone] with questions or concerns regarding your invoice.   Our billing staff will not be able to assist you with questions regarding bills from these companies.  You will be contacted with the lab results as soon as they are available. The fastest way to get your results is to activate your My Chart account. Instructions are located on the last page of this paperwork. If you have not heard from us regarding the results in 2 weeks, please contact this office.       Hypertension Hypertension, commonly called high blood pressure, is when the force of blood pumping through the arteries is too strong. The arteries are the blood vessels that carry blood from the heart throughout the body. Hypertension forces the heart to work harder to pump blood and may cause arteries to become narrow or stiff. Having untreated or uncontrolled hypertension can cause heart attacks, strokes, kidney disease, and other problems. A blood pressure reading consists of a higher number over a lower number. Ideally, your blood pressure should be below 120/80. The first ("top") number is called the systolic pressure. It is a measure of the pressure in your arteries as your heart beats. The second ("bottom") number is called the diastolic pressure. It is a measure of the pressure in your arteries as the heart relaxes. What are the causes? The cause of this condition is not known. What increases the  risk? Some risk factors for high blood pressure are under your control. Others are not. Factors you can change  Smoking.  Having type 2 diabetes mellitus, high cholesterol, or both.  Not getting enough exercise or physical activity.  Being overweight.  Having too much fat, sugar, calories, or salt (sodium) in your diet.  Drinking too much alcohol. Factors that are difficult or impossible to change  Having chronic kidney disease.  Having a family history of high blood pressure.  Age. Risk increases with age.  Race. You may be at higher risk if you are African-American.  Gender. Men are at higher risk than women before age 45. After age 65, women are at higher risk than men.  Having obstructive sleep apnea.  Stress. What are the signs or symptoms? Extremely high blood pressure (hypertensive crisis) may cause:  Headache.  Anxiety.  Shortness of breath.  Nosebleed.  Nausea and vomiting.  Severe chest pain.  Jerky movements you cannot control (seizures). How is this diagnosed? This condition is diagnosed by measuring your blood pressure while you are seated, with your arm resting on a surface. The cuff of the blood pressure monitor will be placed directly against the skin of your upper arm at the level of your heart. It should be measured at least twice using the same arm. Certain conditions can cause a difference in blood pressure between your right and left arms. Certain factors can cause blood pressure readings to be lower or higher than normal (elevated) for a short period of time:    When your blood pressure is higher when you are in a health care provider's office than when you are at home, this is called white coat hypertension. Most people with this condition do not need medicines.  When your blood pressure is higher at home than when you are in a health care provider's office, this is called masked hypertension. Most people with this condition may need medicines  to control blood pressure. If you have a high blood pressure reading during one visit or you have normal blood pressure with other risk factors:  You may be asked to return on a different day to have your blood pressure checked again.  You may be asked to monitor your blood pressure at home for 1 week or longer. If you are diagnosed with hypertension, you may have other blood or imaging tests to help your health care provider understand your overall risk for other conditions. How is this treated? This condition is treated by making healthy lifestyle changes, such as eating healthy foods, exercising more, and reducing your alcohol intake. Your health care provider may prescribe medicine if lifestyle changes are not enough to get your blood pressure under control, and if:  Your systolic blood pressure is above 130.  Your diastolic blood pressure is above 80. Your personal target blood pressure may vary depending on your medical conditions, your age, and other factors. Follow these instructions at home: Eating and drinking   Eat a diet that is high in fiber and potassium, and low in sodium, added sugar, and fat. An example eating plan is called the DASH (Dietary Approaches to Stop Hypertension) diet. To eat this way: ? Eat plenty of fresh fruits and vegetables. Try to fill half of your plate at each meal with fruits and vegetables. ? Eat whole grains, such as whole wheat pasta, brown rice, or whole grain bread. Fill about one quarter of your plate with whole grains. ? Eat or drink low-fat dairy products, such as skim milk or low-fat yogurt. ? Avoid fatty cuts of meat, processed or cured meats, and poultry with skin. Fill about one quarter of your plate with lean proteins, such as fish, chicken without skin, beans, eggs, and tofu. ? Avoid premade and processed foods. These tend to be higher in sodium, added sugar, and fat.  Reduce your daily sodium intake. Most people with hypertension should  eat less than 1,500 mg of sodium a day.  Limit alcohol intake to no more than 1 drink a day for nonpregnant women and 2 drinks a day for men. One drink equals 12 oz of beer, 5 oz of wine, or 1 oz of hard liquor. Lifestyle   Work with your health care provider to maintain a healthy body weight or to lose weight. Ask what an ideal weight is for you.  Get at least 30 minutes of exercise that causes your heart to beat faster (aerobic exercise) most days of the week. Activities may include walking, swimming, or biking.  Include exercise to strengthen your muscles (resistance exercise), such as pilates or lifting weights, as part of your weekly exercise routine. Try to do these types of exercises for 30 minutes at least 3 days a week.  Do not use any products that contain nicotine or tobacco, such as cigarettes and e-cigarettes. If you need help quitting, ask your health care provider.  Monitor your blood pressure at home as told by your health care provider.  Keep all follow-up visits as told by your health care provider.   This is important. Medicines  Take over-the-counter and prescription medicines only as told by your health care provider. Follow directions carefully. Blood pressure medicines must be taken as prescribed.  Do not skip doses of blood pressure medicine. Doing this puts you at risk for problems and can make the medicine less effective.  Ask your health care provider about side effects or reactions to medicines that you should watch for. Contact a health care provider if:  You think you are having a reaction to a medicine you are taking.  You have headaches that keep coming back (recurring).  You feel dizzy.  You have swelling in your ankles.  You have trouble with your vision. Get help right away if:  You develop a severe headache or confusion.  You have unusual weakness or numbness.  You feel faint.  You have severe pain in your chest or abdomen.  You vomit  repeatedly.  You have trouble breathing. Summary  Hypertension is when the force of blood pumping through your arteries is too strong. If this condition is not controlled, it may put you at risk for serious complications.  Your personal target blood pressure may vary depending on your medical conditions, your age, and other factors. For most people, a normal blood pressure is less than 120/80.  Hypertension is treated with lifestyle changes, medicines, or a combination of both. Lifestyle changes include weight loss, eating a healthy, low-sodium diet, exercising more, and limiting alcohol. This information is not intended to replace advice given to you by your health care provider. Make sure you discuss any questions you have with your health care provider. Document Released: 04/08/2005 Document Revised: 03/06/2016 Document Reviewed: 03/06/2016 Elsevier Interactive Patient Education  2019 [employer]  

## 2018-07-07 NOTE — Progress Notes (Signed)
BP Readings from Last 3 Encounters:  07/07/18 136/82  05/19/18 118/80  01/05/18 134/85   Ann Thornton 43 y.o.   Chief Complaint  Patient presents with   Follow-up    hbp     HISTORY OF PRESENT ILLNESS: This is a 43 y.o. female with history of hypertension here for follow-up.  Doing well.  Has no complaints or medical concerns today.  Has history of dyslipidemia.  Presently on no medication.  Cholesterol was high early last year and then again during an annual physical midyear. The 10-year ASCVD risk score Mikey Bussing DC Brooke Bonito., et al., 2013) is: 5.1%   Values used to calculate the score:     Age: 60 years     Sex: Female     Is Non-Hispanic African American: Yes     Diabetic: No     Tobacco smoker: No     Systolic Blood Pressure: 025 mmHg     Is BP treated: Yes     HDL Cholesterol: 42 mg/dL     Total Cholesterol: 238 mg/dL Lab Results  Component Value Date   CHOL 238 (H) 05/05/2017   HDL 42 05/05/2017   LDLCALC 177 (H) 05/05/2017   TRIG 97 05/05/2017   CHOLHDL 5.7 (H) 05/05/2017    HPI   Prior to Admission medications   Medication Sig Start Date End Date Taking? Authorizing Provider  amLODipine (NORVASC) 10 MG tablet Take 1 tablet (10 mg total) by mouth daily. 01/05/18 07/07/18 Yes Aleeha Boline, Ines Bloomer, MD  irbesartan (AVAPRO) 150 MG tablet Take 1 tablet (150 mg total) by mouth daily. 01/05/18 07/07/18 Yes Nephtali Docken, Ines Bloomer, MD  citalopram (CELEXA) 20 MG tablet 1/2 a tablet a day for one week, if tolerating after one week then increase to one tablet a day Patient not taking: Reported on 07/08/2017 06/23/17   Salvadore Dom, MD    No Known Allergies  Patient Active Problem List   Diagnosis Date Noted   Anxiety 12/04/2016   Dizziness and giddiness 07/26/2016   Insomnia 04/08/2015   Essential hypertension 03/23/2015   Anxiety disorder 06/02/2014   Panic attacks 06/02/2014    Past Medical History:  Diagnosis Date   Allergy    Anxiety    High  blood pressure     Past Surgical History:  Procedure Laterality Date   EYE SURGERY Right 2009   Tightened muscle of eye    Social History   Socioeconomic History   Marital status: Married    Spouse name: Not on file   Number of children: Not on file   Years of education: Not on file   Highest education level: Not on file  Occupational History   Occupation: customer service    Employer: [employer]   Financial resource strain: Not on file   Food insecurity:    Worry: Not on file    Inability: Not on file   Transportation needs:    Medical: Not on file    Non-medical: Not on file  Tobacco Use   Smoking status: Never Smoker   Smokeless tobacco: Never Used  Substance and Sexual Activity   Alcohol use: Yes    Alcohol/week: 7.0 standard drinks    Types: 7 Standard drinks or equivalent per week    Comment: Has 1 cocktail every day   Drug use: No   Sexual activity: Yes    Partners: Male    Birth control/protection: None  Lifestyle   Physical activity:  Days per week: Not on file    Minutes per session: Not on file   Stress: Not on file  Relationships   Social connections:    Talks on phone: Not on file    Gets together: Not on file    Attends religious service: Not on file    Active member of club or organization: Not on file    Attends meetings of clubs or organizations: Not on file    Relationship status: Not on file   Intimate partner violence:    Fear of current or ex partner: Not on file    Emotionally abused: Not on file    Physically abused: Not on file    Forced sexual activity: Not on file  Other Topics Concern   Not on file  Social History Narrative   Single without children   Working from home   Exercise - zumba once a week, fitness workout on Mondays   Diet - trying to be good, eating no salt.          As of 11/08/15:      Diet: N/A   Caffeine: No Sodas   Married: Engaged   House: Lives in apartment,  2 stories, 1 person    Pets: None   Current/Past profession: Data Entry    Exercise: No    Living Will: No    DNR: No    POA/HPOA: No        Family History  Problem Relation Age of Onset   Hypertension Mother    Stroke Mother    Thyroid disease Sister      Review of Systems  Constitutional: Negative.  Negative for chills and fever.  HENT: Negative.   Eyes: Negative.  Negative for blurred vision and double vision.  Respiratory: Negative.  Negative for cough and shortness of breath.   Cardiovascular: Negative.  Negative for chest pain and palpitations.  Gastrointestinal: Negative.  Negative for abdominal pain, diarrhea, nausea and vomiting.  Genitourinary: Negative.  Negative for dysuria and hematuria.  Skin: Negative.  Negative for rash.  Neurological: Negative for dizziness and headaches.  Endo/Heme/Allergies: Negative.   All other systems reviewed and are negative.   Vitals:   07/07/18 1503  BP: 136/82  Pulse: (!) 121  Resp: 17  Temp: 98.1 F (36.7 C)  SpO2: 98%   Physical Exam Vitals signs reviewed.  Constitutional:      Appearance: Normal appearance.  HENT:     Head: Normocephalic and atraumatic.     Nose: Nose normal.     Mouth/Throat:     Mouth: Mucous membranes are moist.     Pharynx: Oropharynx is clear.  Eyes:     Extraocular Movements: Extraocular movements intact.     Conjunctiva/sclera: Conjunctivae normal.     Pupils: Pupils are equal, round, and reactive to light.  Neck:     Musculoskeletal: Normal range of motion and neck supple.  Cardiovascular:     Rate and Rhythm: Normal rate and regular rhythm.     Heart sounds: Normal heart sounds.  Pulmonary:     Effort: Pulmonary effort is normal.     Breath sounds: Normal breath sounds.  Abdominal:     Palpations: Abdomen is soft.     Tenderness: There is no abdominal tenderness.  Musculoskeletal: Normal range of motion.  Skin:    General: Skin is warm and dry.     Capillary Refill:  Capillary refill takes less than 2 seconds.  Neurological:  General: No focal deficit present.     Mental Status: She is alert and oriented to person, place, and time.  Psychiatric:        Mood and Affect: Mood normal.        Behavior: Behavior normal.    A total of 25 minutes was spent in the room with the patient, greater than 50% of which was in counseling/coordination of care regarding hypertension and high cholesterol, treatment, medications, need for blood work, diet and nutrition, prognosis and need for follow-up.   ASSESSMENT & PLAN: Ann Thornton was seen today for follow-up.  Diagnoses and all orders for this visit:  Essential hypertension -     Comprehensive metabolic panel; Future -     Lipid panel; Future -     Hemoglobin A1c; Future  Mixed hyperlipidemia -     Comprehensive metabolic panel; Future -     Lipid panel; Future -     Hemoglobin A1c; Future    Patient Instructions       If you have lab work done today you will be contacted with your lab results within the next 2 weeks.  If you have not heard from [place] then please contact us. The fastest way to get your results is to register for My Chart.   IF you received an x-ray today, you will receive an invoice from [facility]. Please contact [facility] at [phone] with questions or concerns regarding your invoice.   IF you received labwork today, you will receive an invoice from [facility]. Please contact [facility] at [phone] with questions or concerns regarding your invoice.   Our billing staff will not be able to assist you with questions regarding bills from these companies.  You will be contacted with the lab results as soon as they are available. The fastest way to get your results is to activate your My Chart account. Instructions are located on the last page of this paperwork. If you have not heard from [place] regarding the results in 2 weeks, please contact this office.       Hypertension Hypertension, commonly called high blood pressure, is when the force of blood pumping through the arteries is too strong. The arteries are the blood vessels that carry blood from the heart throughout the body. Hypertension forces the heart to work harder to pump blood and may cause arteries to become narrow or stiff. Having untreated or uncontrolled hypertension can cause heart attacks, strokes, kidney disease, and other problems. A blood pressure reading consists of a higher number over a lower number. Ideally, your blood pressure should be below 120/80. The first ("top") number is called the systolic pressure. It is a measure of the pressure in your arteries as your heart beats. The second ("bottom") number is called the diastolic pressure. It is a measure of the pressure in your arteries as the heart relaxes. What are the causes? The cause of this condition is not known. What increases the risk? Some risk factors for high blood pressure are under your control. Others are not. Factors you can change  Smoking.  Having type 2 diabetes mellitus, high cholesterol, or both.  Not getting enough exercise or physical activity.  Being overweight.  Having too much fat, sugar, calories, or salt (sodium) in your diet.  Drinking too much alcohol. Factors that are difficult or impossible to change  Having chronic kidney disease.  Having a family history of high blood pressure.  Age. Risk increases with age.  Race. You may  be at higher risk if you are African-American.  Gender. Men are at higher risk than women before age 50. After age 56, women are at higher risk than men.  Having obstructive sleep apnea.  Stress. What are the signs or symptoms? Extremely high blood pressure (hypertensive crisis) may cause:  Headache.  Anxiety.  Shortness of breath.  Nosebleed.  Nausea and vomiting.  Severe chest pain.  Jerky movements you cannot control (seizures). How is  this diagnosed? This condition is diagnosed by measuring your blood pressure while you are seated, with your arm resting on a surface. The cuff of the blood pressure monitor will be placed directly against the skin of your upper arm at the level of your heart. It should be measured at least twice using the same arm. Certain conditions can cause a difference in blood pressure between your right and left arms. Certain factors can cause blood pressure readings to be lower or higher than normal (elevated) for a short period of time:  When your blood pressure is higher when you are in a health care provider's office than when you are at home, this is called white coat hypertension. Most people with this condition do not need medicines.  When your blood pressure is higher at home than when you are in a health care provider's office, this is called masked hypertension. Most people with this condition may need medicines to control blood pressure. If you have a high blood pressure reading during one visit or you have normal blood pressure with other risk factors:  You may be asked to return on a different day to have your blood pressure checked again.  You may be asked to monitor your blood pressure at home for 1 week or longer. If you are diagnosed with hypertension, you may have other blood or imaging tests to help your health care provider understand your overall risk for other conditions. How is this treated? This condition is treated by making healthy lifestyle changes, such as eating healthy foods, exercising more, and reducing your alcohol intake. Your health care provider may prescribe medicine if lifestyle changes are not enough to get your blood pressure under control, and if:  Your systolic blood pressure is above 130.  Your diastolic blood pressure is above 80. Your personal target blood pressure may vary depending on your medical conditions, your age, and other factors. Follow these  instructions at home: Eating and drinking   Eat a diet that is high in fiber and potassium, and low in sodium, added sugar, and fat. An example eating plan is called the DASH (Dietary Approaches to Stop Hypertension) diet. To eat this way: ? Eat plenty of fresh fruits and vegetables. Try to fill half of your plate at each meal with fruits and vegetables. ? Eat whole grains, such as whole wheat pasta, brown rice, or whole grain bread. Fill about one quarter of your plate with whole grains. ? Eat or drink low-fat dairy products, such as skim milk or low-fat yogurt. ? Avoid fatty cuts of meat, processed or cured meats, and poultry with skin. Fill about one quarter of your plate with lean proteins, such as fish, chicken without skin, beans, eggs, and tofu. ? Avoid premade and processed foods. These tend to be higher in sodium, added sugar, and fat.  Reduce your daily sodium intake. Most people with hypertension should eat less than 1,500 mg of sodium a day.  Limit alcohol intake to no more than 1 drink a day for  nonpregnant women and 2 drinks a day for men. One drink equals 12 oz of beer, 5 oz of wine, or 1 oz of hard liquor. Lifestyle   Work with your health care provider to maintain a healthy body weight or to lose weight. Ask what an ideal weight is for you.  Get at least 30 minutes of exercise that causes your heart to beat faster (aerobic exercise) most days of the week. Activities may include walking, swimming, or biking.  Include exercise to strengthen your muscles (resistance exercise), such as pilates or lifting weights, as part of your weekly exercise routine. Try to do these types of exercises for 30 minutes at least 3 days a week.  Do not use any products that contain nicotine or tobacco, such as cigarettes and e-cigarettes. If you need help quitting, ask your health care provider.  Monitor your blood pressure at home as told by your health care provider.  Keep all follow-up  visits as told by your health care provider. This is important. Medicines  Take over-the-counter and prescription medicines only as told by your health care provider. Follow directions carefully. Blood pressure medicines must be taken as prescribed.  Do not skip doses of blood pressure medicine. Doing this puts you at risk for problems and can make the medicine less effective.  Ask your health care provider about side effects or reactions to medicines that you should watch for. Contact a health care provider if:  You think you are having a reaction to a medicine you are taking.  You have headaches that keep coming back (recurring).  You feel dizzy.  You have swelling in your ankles.  You have trouble with your vision. Get help right away if:  You develop a severe headache or confusion.  You have unusual weakness or numbness.  You feel faint.  You have severe pain in your chest or abdomen.  You vomit repeatedly.  You have trouble breathing. Summary  Hypertension is when the force of blood pumping through your arteries is too strong. If this condition is not controlled, it may put you at risk for serious complications.  Your personal target blood pressure may vary depending on your medical conditions, your age, and other factors. For most people, a normal blood pressure is less than 120/80.  Hypertension is treated with lifestyle changes, medicines, or a combination of both. Lifestyle changes include weight loss, eating a healthy, low-sodium diet, exercising more, and limiting alcohol. This information is not intended to replace advice given to you by your health care provider. Make sure you discuss any questions you have with your health care provider. Document Released: 04/08/2005 Document Revised: 03/06/2016 Document Reviewed: 03/06/2016 Elsevier Interactive Patient Education  2019 [employer]      Agustina Caroli, MD [facility]

## 2018-07-15 ENCOUNTER — Ambulatory Visit: Payer: 59 | Admitting: Obstetrics and Gynecology

## 2018-08-25 ENCOUNTER — Encounter: Payer: Self-pay | Admitting: Emergency Medicine

## 2018-08-25 ENCOUNTER — Other Ambulatory Visit: Payer: Self-pay

## 2018-08-25 ENCOUNTER — Ambulatory Visit (INDEPENDENT_AMBULATORY_CARE_PROVIDER_SITE_OTHER): Payer: 59 | Admitting: Emergency Medicine

## 2018-08-25 VITALS — BP 124/85 | HR 114 | Temp 98.6°F | Resp 16 | Ht 64.0 in | Wt 197.8 lb

## 2018-08-25 DIAGNOSIS — Z13228 Encounter for screening for other metabolic disorders: Secondary | ICD-10-CM

## 2018-08-25 DIAGNOSIS — Z0001 Encounter for general adult medical examination with abnormal findings: Secondary | ICD-10-CM

## 2018-08-25 DIAGNOSIS — Z130 Encounter for screening for diseases of the blood and blood-forming organs and certain disorders involving the immune mechanism: Secondary | ICD-10-CM

## 2018-08-25 DIAGNOSIS — Z1329 Encounter for screening for other suspected endocrine disorder: Secondary | ICD-10-CM

## 2018-08-25 DIAGNOSIS — I10 Essential (primary) hypertension: Secondary | ICD-10-CM

## 2018-08-25 DIAGNOSIS — Z0000 Encounter for general adult medical examination without abnormal findings: Secondary | ICD-10-CM

## 2018-08-25 DIAGNOSIS — Z13220 Encounter for screening for lipoid disorders: Secondary | ICD-10-CM | POA: Diagnosis not present

## 2018-08-25 NOTE — Patient Instructions (Addendum)
If you have lab work done today you will be contacted with your lab results within the next 2 weeks.  If you have not heard from [place] then please contact us. The fastest way to get your results is to register for My Chart.   IF you received an x-ray today, you will receive an invoice from [facility]. Please contact [facility] at [phone] with questions or concerns regarding your invoice.   IF you received labwork today, you will receive an invoice from [facility]. Please contact [facility] at [phone] with questions or concerns regarding your invoice.   Our billing staff will not be able to assist you with questions regarding bills from these companies.  You will be contacted with the lab results as soon as they are available. The fastest way to get your results is to activate your My Chart account. Instructions are located on the last page of this paperwork. If you have not heard from [place] regarding the results in 2 weeks, please contact this office.    We recommend that you schedule a mammogram for breast cancer screening. Typically, you do not need a referral to do this. Please contact a local imaging center to schedule your mammogram.  [facility] - [phone]  *ask for the [facility] [place] ([place]) - [phone] or [phone]  [facility] - [phone] [place] [phone] [facility] - [phone]  *ask for the [facility] - [phone]  *ask for the Radiology Department [facility] - [phone]  *ask for the [place] - [phone] Health Maintenance, Female Adopting a healthy lifestyle and getting preventive care can go a long way to promote health and wellness. Talk with your health care provider about what schedule of regular examinations is right for you. This is a good  chance for you to check in with your provider about disease prevention and staying healthy. In between checkups, there are plenty of things you can do on your own. Experts have done a lot of research about which lifestyle changes and preventive measures are most likely to keep you healthy. Ask your health care provider for more information. Weight and diet Eat a healthy diet  Be sure to include plenty of vegetables, fruits, low-fat dairy products, and lean protein.  Do not eat a lot of foods high in solid fats, added sugars, or salt.  Get regular exercise. This is one of the most important things you can do for your health. ? Most adults should exercise for at least 150 minutes each week. The exercise should increase your heart rate and make you sweat (moderate-intensity exercise). ? Most adults should also do strengthening exercises at least twice a week. This is in addition to the moderate-intensity exercise. Maintain a healthy weight  Body mass index (BMI) is a measurement that can be used to identify possible weight problems. It estimates body fat based on height and weight. Your health care provider can help determine your BMI and help you achieve or maintain a healthy weight.  For females 72 years of age and older: ? A BMI below 18.5 is considered underweight. ? A BMI of 18.5 to 24.9 is normal. ? A BMI of 25 to 29.9 is considered overweight. ? A BMI of 30 and above is considered obese. Watch levels of cholesterol and blood lipids  You should start having your blood tested  for lipids and cholesterol at 43 years of age, then have this test every 5 years.  You may need to have your cholesterol levels checked more often if: ? Your lipid or cholesterol levels are high. ? You are older than 43 years of age. ? You are at high risk for heart disease. Cancer screening Lung Cancer  Lung cancer screening is recommended for adults 66-82 years old who are at high risk for lung cancer because  of a history of smoking.  A yearly low-dose CT scan of the lungs is recommended for people who: ? Currently smoke. ? Have quit within the past 15 years. ? Have at least a 30-pack-year history of smoking. A pack year is smoking an average of one pack of cigarettes a day for 1 year.  Yearly screening should continue until it has been 15 years since you quit.  Yearly screening should stop if you develop a health problem that would prevent you from having lung cancer treatment. Breast Cancer  Practice breast self-awareness. This means understanding how your breasts normally appear and feel.  It also means doing regular breast self-exams. Let your health care provider know about any changes, no matter how small.  If you are in your 20s or 30s, you should have a clinical breast exam (CBE) by a health care provider every 1-3 years as part of a regular health exam.  If you are 79 or older, have a CBE every year. Also consider having a breast X-ray (mammogram) every year.  If you have a family history of breast cancer, talk to your health care provider about genetic screening.  If you are at high risk for breast cancer, talk to your health care provider about having an MRI and a mammogram every year.  Breast cancer gene (BRCA) assessment is recommended for women who have family members with BRCA-related cancers. BRCA-related cancers include: ? Breast. ? Ovarian. ? Tubal. ? Peritoneal cancers.  Results of the assessment will determine the need for genetic counseling and BRCA1 and BRCA2 testing. Cervical Cancer Your health care provider may recommend that you be screened regularly for cancer of the pelvic organs (ovaries, uterus, and vagina). This screening involves a pelvic examination, including checking for microscopic changes to the surface of your cervix (Pap test). You may be encouraged to have this screening done every 3 years, beginning at age 78.  For women ages 26-65, health care  providers may recommend pelvic exams and Pap testing every 3 years, or they may recommend the Pap and pelvic exam, combined with testing for human papilloma virus (HPV), every 5 years. Some types of HPV increase your risk of cervical cancer. Testing for HPV may also be done on women of any age with unclear Pap test results.  Other health care providers may not recommend any screening for nonpregnant women who are considered low risk for pelvic cancer and who do not have symptoms. Ask your health care provider if a screening pelvic exam is right for you.  If you have had past treatment for cervical cancer or a condition that could lead to cancer, you need Pap tests and screening for cancer for at least 20 years after your treatment. If Pap tests have been discontinued, your risk factors (such as having a new sexual partner) need to be reassessed to determine if screening should resume. Some women have medical problems that increase the chance of getting cervical cancer. In these cases, your health care provider may recommend more frequent screening  and Pap tests. Colorectal Cancer  This type of cancer can be detected and often prevented.  Routine colorectal cancer screening usually begins at 43 years of age and continues through 43 years of age.  Your health care provider may recommend screening at an earlier age if you have risk factors for colon cancer.  Your health care provider may also recommend using home test kits to check for hidden blood in the stool.  A small camera at the end of a tube can be used to examine your colon directly (sigmoidoscopy or colonoscopy). This is done to check for the earliest forms of colorectal cancer.  Routine screening usually begins at age 46.  Direct examination of the colon should be repeated every 5-10 years through 43 years of age. However, you may need to be screened more often if early forms of precancerous polyps or small growths are found. Skin  Cancer  Check your skin from head to toe regularly.  Tell your health care provider about any new moles or changes in moles, especially if there is a change in a mole's shape or color.  Also tell your health care provider if you have a mole that is larger than the size of a pencil eraser.  Always use sunscreen. Apply sunscreen liberally and repeatedly throughout the day.  Protect yourself by wearing long sleeves, pants, a wide-brimmed hat, and sunglasses whenever you are outside. Heart disease, diabetes, and high blood pressure  High blood pressure causes heart disease and increases the risk of stroke. High blood pressure is more likely to develop in: ? People who have blood pressure in the high end of the normal range (130-139/85-89 mm Hg). ? People who are overweight or obese. ? People who are African American.  If you are 47-81 years of age, have your blood pressure checked every 3-5 years. If you are 86 years of age or older, have your blood pressure checked every year. You should have your blood pressure measured twice-once when you are at a hospital or clinic, and once when you are not at a hospital or clinic. Record the average of the two measurements. To check your blood pressure when you are not at a hospital or clinic, you can use: ? An automated blood pressure machine at a pharmacy. ? A home blood pressure monitor.  If you are between 66 years and 28 years old, ask your health care provider if you should take aspirin to prevent strokes.  Have regular diabetes screenings. This involves taking a blood sample to check your fasting blood sugar level. ? If you are at a normal weight and have a low risk for diabetes, have this test once every three years after 43 years of age. ? If you are overweight and have a high risk for diabetes, consider being tested at a younger age or more often. Preventing infection Hepatitis B  If you have a higher risk for hepatitis B, you should be  screened for this virus. You are considered at high risk for hepatitis B if: ? You were born in a country where hepatitis B is common. Ask your health care provider which countries are considered high risk. ? Your parents were born in a high-risk country, and you have not been immunized against hepatitis B (hepatitis B vaccine). ? You have HIV or AIDS. ? You use needles to inject street drugs. ? You live with someone who has hepatitis B. ? You have had sex with someone who has hepatitis B. ?  You get hemodialysis treatment. ? You take certain medicines for conditions, including cancer, organ transplantation, and autoimmune conditions. Hepatitis C  Blood testing is recommended for: ? Everyone born from [birth year] through 1965. ? Anyone with known risk factors for hepatitis C. Sexually transmitted infections (STIs)  You should be screened for sexually transmitted infections (STIs) including gonorrhea and chlamydia if: ? You are sexually active and are younger than 43 years of age. ? You are older than 43 years of age and your health care provider tells you that you are at risk for this type of infection. ? Your sexual activity has changed since you were last screened and you are at an increased risk for chlamydia or gonorrhea. Ask your health care provider if you are at risk.  If you do not have HIV, but are at risk, it may be recommended that you take a prescription medicine daily to prevent HIV infection. This is called pre-exposure prophylaxis (PrEP). You are considered at risk if: ? You are sexually active and do not regularly use condoms or know the HIV status of your partner(s). ? You take drugs by injection. ? You are sexually active with a partner who has HIV. Talk with your health care provider about whether you are at high risk of being infected with HIV. If you choose to begin PrEP, you should first be tested for HIV. You should then be tested every 3 months for as long as you are taking  PrEP. Pregnancy  If you are premenopausal and you may become pregnant, ask your health care provider about preconception counseling.  If you may become pregnant, take 400 to 800 micrograms (mcg) of folic acid every day.  If you want to prevent pregnancy, talk to your health care provider about birth control (contraception). Osteoporosis and menopause  Osteoporosis is a disease in which the bones lose minerals and strength with aging. This can result in serious bone fractures. Your risk for osteoporosis can be identified using a bone density scan.  If you are 45 years of age or older, or if you are at risk for osteoporosis and fractures, ask your health care provider if you should be screened.  Ask your health care provider whether you should take a calcium or vitamin D supplement to lower your risk for osteoporosis.  Menopause may have certain physical symptoms and risks.  Hormone replacement therapy may reduce some of these symptoms and risks. Talk to your health care provider about whether hormone replacement therapy is right for you. Follow these instructions at home:  Schedule regular health, dental, and eye exams.  Stay current with your immunizations.  Do not use any tobacco products including cigarettes, chewing tobacco, or electronic cigarettes.  If you are pregnant, do not drink alcohol.  If you are breastfeeding, limit how much and how often you drink alcohol.  Limit alcohol intake to no more than 1 drink per day for nonpregnant women. One drink equals 12 ounces of beer, 5 ounces of wine, or 1 ounces of hard liquor.  Do not use street drugs.  Do not share needles.  Ask your health care provider for help if you need support or information about quitting drugs.  Tell your health care provider if you often feel depressed.  Tell your health care provider if you have ever been abused or do not feel safe at home. This information is not intended to replace advice given  to you by your health care provider. Make sure you discuss any  questions you have with your health care provider. Document Released: 10/22/2010 Document Revised: 09/14/2015 Document Reviewed: 01/10/2015 Elsevier Interactive Patient Education  2019 [employer].

## 2018-08-25 NOTE — Progress Notes (Addendum)
BP Readings from Last 3 Encounters:  08/25/18 124/85  07/07/18 136/82  05/19/18 118/80   Ann Thornton 43 y.o.   Chief Complaint  Patient presents with  . Annual Exam    no pap smear    HISTORY OF PRESENT ILLNESS: This is a 43 y.o. female here for annual exam. Has a history of hypertension, on amlodipine and Avapro. No complaints or medical concerns today. Health maintenance up-to-date.  HPI   Prior to Admission medications   Medication Sig Start Date End Date Taking? Authorizing Provider  amLODipine (NORVASC) 10 MG tablet Take 1 tablet (10 mg total) by mouth daily. 01/05/18 07/07/18  Horald Pollen, MD  citalopram (CELEXA) 20 MG tablet 1/2 a tablet a day for one week, if tolerating after one week then increase to one tablet a day Patient not taking: Reported on 07/08/2017 06/23/17   Salvadore Dom, MD  irbesartan (AVAPRO) 150 MG tablet Take 1 tablet (150 mg total) by mouth daily. 01/05/18 07/07/18  Horald Pollen, MD    No Known Allergies  Patient Active Problem List   Diagnosis Date Noted  . Mixed hyperlipidemia 07/07/2018  . Anxiety 12/04/2016  . Dizziness and giddiness 07/26/2016  . Insomnia 04/08/2015  . Essential hypertension 03/23/2015  . Anxiety disorder 06/02/2014  . Panic attacks 06/02/2014    Past Medical History:  Diagnosis Date  . Allergy   . Anxiety   . High blood pressure     Past Surgical History:  Procedure Laterality Date  . EYE SURGERY Right 2009   Tightened muscle of eye    Social History   Socioeconomic History  . Marital status: Married    Spouse name: Not on file  . Number of children: Not on file  . Years of education: Not on file  . Highest education level: Not on file  Occupational History  . Occupation: Research scientist (physical sciences): [employer]  . Financial resource strain: Not on file  . Food insecurity:    Worry: Not on file    Inability: Not on file  . Transportation needs:     Medical: Not on file    Non-medical: Not on file  Tobacco Use  . Smoking status: Never Smoker  . Smokeless tobacco: Never Used  Substance and Sexual Activity  . Alcohol use: Yes    Alcohol/week: 7.0 standard drinks    Types: 7 Standard drinks or equivalent per week    Comment: Has 1 cocktail every day  . Drug use: No  . Sexual activity: Yes    Partners: Male    Birth control/protection: None  Lifestyle  . Physical activity:    Days per week: Not on file    Minutes per session: Not on file  . Stress: Not on file  Relationships  . Social connections:    Talks on phone: Not on file    Gets together: Not on file    Attends religious service: Not on file    Active member of club or organization: Not on file    Attends meetings of clubs or organizations: Not on file    Relationship status: Not on file  . Intimate partner violence:    Fear of current or ex partner: Not on file    Emotionally abused: Not on file    Physically abused: Not on file    Forced sexual activity: Not on file  Other Topics Concern  . Not on file  Social History Narrative   Single without children   Working from home   Exercise - zumba once a week, fitness workout on Mondays   Diet - trying to be good, eating no salt.          As of 11/08/15:      Diet: N/A   Caffeine: No Sodas   Married: Engaged   House: Lives in apartment, 2 stories, 1 person    Pets: None   Current/Past profession: Data Entry    Exercise: No    Living Will: No    DNR: No    POA/HPOA: No        Family History  Problem Relation Age of Onset  . Hypertension Mother   . Stroke Mother   . Thyroid disease Sister      Review of Systems  Constitutional: Negative.  Negative for chills, fever, malaise/fatigue and weight loss.  HENT: Negative.  Negative for congestion and sore throat.   Eyes: Negative for blurred vision and double vision.  Respiratory: Negative.  Negative for cough and shortness of breath.   Cardiovascular:  Negative.  Negative for chest pain and leg swelling.  Gastrointestinal: Negative.  Negative for abdominal pain, diarrhea, nausea and vomiting.  Genitourinary: Negative for dysuria and hematuria.  Musculoskeletal: Negative for myalgias.  Skin: Negative.  Negative for rash.  Neurological: Negative.  Negative for dizziness and headaches.  All other systems reviewed and are negative.    Vitals:   08/25/18 0827  BP: 124/85  Pulse: (!) 114  Resp: 16  Temp: 98.6 F (37 C)  SpO2: 97%     Physical Exam Vitals signs reviewed.  Constitutional:      Appearance: Normal appearance.  HENT:     Head: Normocephalic and atraumatic.     Nose: Nose normal.     Mouth/Throat:     Mouth: Mucous membranes are moist.     Pharynx: Oropharynx is clear.  Eyes:     Extraocular Movements: Extraocular movements intact.     Conjunctiva/sclera: Conjunctivae normal.     Pupils: Pupils are equal, round, and reactive to light.  Neck:     Musculoskeletal: Normal range of motion and neck supple.  Cardiovascular:     Rate and Rhythm: Normal rate and regular rhythm.     Heart sounds: Normal heart sounds.  Pulmonary:     Effort: Pulmonary effort is normal.     Breath sounds: Normal breath sounds.  Abdominal:     General: Abdomen is flat. There is no distension.     Palpations: Abdomen is soft. There is no mass.     Tenderness: There is no abdominal tenderness.  Musculoskeletal: Normal range of motion.        General: No swelling or tenderness.     Right lower leg: No edema.     Left lower leg: No edema.  Skin:    General: Skin is warm and dry.     Capillary Refill: Capillary refill takes less than 2 seconds.  Neurological:     General: No focal deficit present.     Mental Status: She is alert and oriented to person, place, and time.  Psychiatric:        Mood and Affect: Mood normal.        Behavior: Behavior normal.     The 10-year ASCVD risk score Mikey Bussing DC Jr., et al., 2013) is: 0.7%   Values  used to calculate the score:     Age: 24  years     Sex: Female     Is Non-Hispanic African American: Yes     Diabetic: No     Tobacco smoker: No     Systolic Blood Pressure: 170 mmHg     Is BP treated: No     HDL Cholesterol: 59 mg/dL     Total Cholesterol: 251 mg/dL   ASSESSMENT & PLAN: Kiala was seen today for annual exam.  Diagnoses and all orders for this visit:  Routine general medical examination at a health care facility  Essential hypertension -     Comprehensive metabolic panel -     CBC with Differential/Platelet -     Lipid panel -     Thyroid Panel With TSH  Screening for deficiency anemia  Screening for lipoid disorders  Screening for endocrine, metabolic and immunity disorder   Patient Instructions       If you have lab work done today you will be contacted with your lab results within the next 2 weeks.  If you have not heard from [place] then please contact us. The fastest way to get your results is to register for My Chart.   IF you received an x-ray today, you will receive an invoice from [facility]. Please contact [facility] at [phone] with questions or concerns regarding your invoice.   IF you received labwork today, you will receive an invoice from [facility]. Please contact [facility] at [phone] with questions or concerns regarding your invoice.   Our billing staff will not be able to assist you with questions regarding bills from these companies.  You will be contacted with the lab results as soon as they are available. The fastest way to get your results is to activate your My Chart account. Instructions are located on the last page of this paperwork. If you have not heard from [place] regarding the results in 2 weeks, please contact this office.    We recommend that you schedule a mammogram for breast cancer screening. Typically, you do not need a referral to do this. Please contact a local imaging center to schedule your  mammogram.  [facility] - [phone]  *ask for the Radiology [place] ([place]) - [phone] or [phone]  [facility] - [phone] [place] [phone] [facility] - [phone]  *ask for the [facility] - [phone]  *ask for the Radiology Department [facility] - [phone]  *ask for the [facility] - [phone] Health Maintenance, Female Adopting a healthy lifestyle and getting preventive care can go a long way to promote health and wellness. Talk with your health care provider about what schedule of regular examinations is right for you. This is a good chance for you to check in with your provider about disease prevention and staying healthy. In between checkups, there are plenty of things you can do on your own. Experts have done a lot of research about which lifestyle changes and preventive measures are most likely to keep you healthy. Ask your health care provider for more information. Weight and diet Eat a healthy diet  Be sure to include plenty of vegetables, fruits, low-fat dairy products, and lean protein.  Do not eat a lot of foods high in solid fats, added sugars, or salt.  Get regular exercise. This is one of the most important things you can do for your health. ? Most adults  should exercise for at least 150 minutes each week. The exercise should increase your heart rate and make you sweat (moderate-intensity exercise). ? Most adults should also do strengthening exercises at least twice a week. This is in addition to the moderate-intensity exercise. Maintain a healthy weight  Body mass index (BMI) is a measurement that can be used to identify possible weight problems. It estimates body fat based on height and weight. Your health care provider can help determine your BMI and help you achieve or  maintain a healthy weight.  For females 43 years of age and older: ? A BMI below 18.5 is considered underweight. ? A BMI of 18.5 to 24.9 is normal. ? A BMI of 25 to 29.9 is considered overweight. ? A BMI of 30 and above is considered obese. Watch levels of cholesterol and blood lipids  You should start having your blood tested for lipids and cholesterol at 43 years of age, then have this test every 5 years.  You may need to have your cholesterol levels checked more often if: ? Your lipid or cholesterol levels are high. ? You are older than 43 years of age. ? You are at high risk for heart disease. Cancer screening Lung Cancer  Lung cancer screening is recommended for adults 57-42 years old who are at high risk for lung cancer because of a history of smoking.  A yearly low-dose CT scan of the lungs is recommended for people who: ? Currently smoke. ? Have quit within the past 15 years. ? Have at least a 30-pack-year history of smoking. A pack year is smoking an average of one pack of cigarettes a day for 1 year.  Yearly screening should continue until it has been 15 years since you quit.  Yearly screening should stop if you develop a health problem that would prevent you from having lung cancer treatment. Breast Cancer  Practice breast self-awareness. This means understanding how your breasts normally appear and feel.  It also means doing regular breast self-exams. Let your health care provider know about any changes, no matter how small.  If you are in your 20s or 30s, you should have a clinical breast exam (CBE) by a health care provider every 1-3 years as part of a regular health exam.  If you are 50 or older, have a CBE every year. Also consider having a breast X-ray (mammogram) every year.  If you have a family history of breast cancer, talk to your health care provider about genetic screening.  If you are at high risk for breast cancer, talk to your health care provider  about having an MRI and a mammogram every year.  Breast cancer gene (BRCA) assessment is recommended for women who have family members with BRCA-related cancers. BRCA-related cancers include: ? Breast. ? Ovarian. ? Tubal. ? Peritoneal cancers.  Results of the assessment will determine the need for genetic counseling and BRCA1 and BRCA2 testing. Cervical Cancer Your health care provider may recommend that you be screened regularly for cancer of the pelvic organs (ovaries, uterus, and vagina). This screening involves a pelvic examination, including checking for microscopic changes to the surface of your cervix (Pap test). You may be encouraged to have this screening done every 3 years, beginning at age 67.  For women ages 25-65, health care providers may recommend pelvic exams and Pap testing every 3 years, or they may recommend the Pap and pelvic exam, combined with testing for human papilloma virus (HPV), every 5 years. Some  types of HPV increase your risk of cervical cancer. Testing for HPV may also be done on women of any age with unclear Pap test results.  Other health care providers may not recommend any screening for nonpregnant women who are considered low risk for pelvic cancer and who do not have symptoms. Ask your health care provider if a screening pelvic exam is right for you.  If you have had past treatment for cervical cancer or a condition that could lead to cancer, you need Pap tests and screening for cancer for at least 20 years after your treatment. If Pap tests have been discontinued, your risk factors (such as having a new sexual partner) need to be reassessed to determine if screening should resume. Some women have medical problems that increase the chance of getting cervical cancer. In these cases, your health care provider may recommend more frequent screening and Pap tests. Colorectal Cancer  This type of cancer can be detected and often prevented.  Routine colorectal  cancer screening usually begins at 43 years of age and continues through 43 years of age.  Your health care provider may recommend screening at an earlier age if you have risk factors for colon cancer.  Your health care provider may also recommend using home test kits to check for hidden blood in the stool.  A small camera at the end of a tube can be used to examine your colon directly (sigmoidoscopy or colonoscopy). This is done to check for the earliest forms of colorectal cancer.  Routine screening usually begins at age 33.  Direct examination of the colon should be repeated every 5-10 years through 43 years of age. However, you may need to be screened more often if early forms of precancerous polyps or small growths are found. Skin Cancer  Check your skin from head to toe regularly.  Tell your health care provider about any new moles or changes in moles, especially if there is a change in a mole's shape or color.  Also tell your health care provider if you have a mole that is larger than the size of a pencil eraser.  Always use sunscreen. Apply sunscreen liberally and repeatedly throughout the day.  Protect yourself by wearing long sleeves, pants, a wide-brimmed hat, and sunglasses whenever you are outside. Heart disease, diabetes, and high blood pressure  High blood pressure causes heart disease and increases the risk of stroke. High blood pressure is more likely to develop in: ? People who have blood pressure in the high end of the normal range (130-139/85-89 mm Hg). ? People who are overweight or obese. ? People who are African American.  If you are 66-81 years of age, have your blood pressure checked every 3-5 years. If you are 52 years of age or older, have your blood pressure checked every year. You should have your blood pressure measured twice-once when you are at a hospital or clinic, and once when you are not at a hospital or clinic. Record the average of the two  measurements. To check your blood pressure when you are not at a hospital or clinic, you can use: ? An automated blood pressure machine at a pharmacy. ? A home blood pressure monitor.  If you are between 61 years and 16 years old, ask your health care provider if you should take aspirin to prevent strokes.  Have regular diabetes screenings. This involves taking a blood sample to check your fasting blood sugar level. ? If you are at a  normal weight and have a low risk for diabetes, have this test once every three years after 43 years of age. ? If you are overweight and have a high risk for diabetes, consider being tested at a younger age or more often. Preventing infection Hepatitis B  If you have a higher risk for hepatitis B, you should be screened for this virus. You are considered at high risk for hepatitis B if: ? You were born in a country where hepatitis B is common. Ask your health care provider which countries are considered high risk. ? Your parents were born in a high-risk country, and you have not been immunized against hepatitis B (hepatitis B vaccine). ? You have HIV or AIDS. ? You use needles to inject street drugs. ? You live with someone who has hepatitis B. ? You have had sex with someone who has hepatitis B. ? You get hemodialysis treatment. ? You take certain medicines for conditions, including cancer, organ transplantation, and autoimmune conditions. Hepatitis C  Blood testing is recommended for: ? Everyone born from [birth year] through 1965. ? Anyone with known risk factors for hepatitis C. Sexually transmitted infections (STIs)  You should be screened for sexually transmitted infections (STIs) including gonorrhea and chlamydia if: ? You are sexually active and are younger than 43 years of age. ? You are older than 43 years of age and your health care provider tells you that you are at risk for this type of infection. ? Your sexual activity has changed since you were last  screened and you are at an increased risk for chlamydia or gonorrhea. Ask your health care provider if you are at risk.  If you do not have HIV, but are at risk, it may be recommended that you take a prescription medicine daily to prevent HIV infection. This is called pre-exposure prophylaxis (PrEP). You are considered at risk if: ? You are sexually active and do not regularly use condoms or know the HIV status of your partner(s). ? You take drugs by injection. ? You are sexually active with a partner who has HIV. Talk with your health care provider about whether you are at high risk of being infected with HIV. If you choose to begin PrEP, you should first be tested for HIV. You should then be tested every 3 months for as long as you are taking PrEP. Pregnancy  If you are premenopausal and you may become pregnant, ask your health care provider about preconception counseling.  If you may become pregnant, take 400 to 800 micrograms (mcg) of folic acid every day.  If you want to prevent pregnancy, talk to your health care provider about birth control (contraception). Osteoporosis and menopause  Osteoporosis is a disease in which the bones lose minerals and strength with aging. This can result in serious bone fractures. Your risk for osteoporosis can be identified using a bone density scan.  If you are 95 years of age or older, or if you are at risk for osteoporosis and fractures, ask your health care provider if you should be screened.  Ask your health care provider whether you should take a calcium or vitamin D supplement to lower your risk for osteoporosis.  Menopause may have certain physical symptoms and risks.  Hormone replacement therapy may reduce some of these symptoms and risks. Talk to your health care provider about whether hormone replacement therapy is right for you. Follow these instructions at home:  Schedule regular health, dental, and eye exams.  Stay  current with your  immunizations.  Do not use any tobacco products including cigarettes, chewing tobacco, or electronic cigarettes.  If you are pregnant, do not drink alcohol.  If you are breastfeeding, limit how much and how often you drink alcohol.  Limit alcohol intake to no more than 1 drink per day for nonpregnant women. One drink equals 12 ounces of beer, 5 ounces of wine, or 1 ounces of hard liquor.  Do not use street drugs.  Do not share needles.  Ask your health care provider for help if you need support or information about quitting drugs.  Tell your health care provider if you often feel depressed.  Tell your health care provider if you have ever been abused or do not feel safe at home. This information is not intended to replace advice given to you by your health care provider. Make sure you discuss any questions you have with your health care provider. Document Released: 10/22/2010 Document Revised: 09/14/2015 Document Reviewed: 01/10/2015 Elsevier Interactive Patient Education  2019 [employer].      Agustina Caroli, MD [facility]

## 2018-08-26 ENCOUNTER — Other Ambulatory Visit: Payer: Self-pay | Admitting: Emergency Medicine

## 2018-08-26 ENCOUNTER — Encounter: Payer: Self-pay | Admitting: Emergency Medicine

## 2018-08-26 DIAGNOSIS — E785 Hyperlipidemia, unspecified: Secondary | ICD-10-CM

## 2018-08-26 DIAGNOSIS — R739 Hyperglycemia, unspecified: Secondary | ICD-10-CM

## 2018-08-26 LAB — COMPREHENSIVE METABOLIC PANEL
ALT: 22 IU/L (ref 0–32)
AST: 25 IU/L (ref 0–40)
Albumin/Globulin Ratio: 1.6 (ref 1.2–2.2)
Albumin: 5 g/dL — ABNORMAL HIGH (ref 3.8–4.8)
Alkaline Phosphatase: 59 IU/L (ref 39–117)
BUN/Creatinine Ratio: 15 (ref 9–23)
BUN: 10 mg/dL (ref 6–24)
Bilirubin Total: 0.4 mg/dL (ref 0.0–1.2)
CO2: 21 mmol/L (ref 20–29)
Calcium: 10.1 mg/dL (ref 8.7–10.2)
Chloride: 97 mmol/L (ref 96–106)
Creatinine, Ser: 0.67 mg/dL (ref 0.57–1.00)
GFR calc Af Amer: 125 mL/min/{1.73_m2} (ref >59)
GFR calc non Af Amer: 109 mL/min/{1.73_m2} (ref >59)
Globulin, Total: 3.1 g/dL (ref 1.5–4.5)
Glucose: 121 mg/dL — ABNORMAL HIGH (ref 65–99)
Potassium: 3.9 mmol/L (ref 3.5–5.2)
Sodium: 136 mmol/L (ref 134–144)
Total Protein: 8.1 g/dL (ref 6.0–8.5)

## 2018-08-26 LAB — CBC WITH DIFFERENTIAL/PLATELET
Basophils Absolute: 0 10*3/uL (ref 0.0–0.2)
Basos: 1 %
EOS (ABSOLUTE): 0.1 10*3/uL (ref 0.0–0.4)
Eos: 1 %
Hematocrit: 36.2 % (ref 34.0–46.6)
Hemoglobin: 12.4 g/dL (ref 11.1–15.9)
Immature Grans (Abs): 0 10*3/uL (ref 0.0–0.1)
Immature Granulocytes: 0 %
Lymphocytes Absolute: 2.2 10*3/uL (ref 0.7–3.1)
Lymphs: 39 %
MCH: 31.8 pg (ref 26.6–33.0)
MCHC: 34.3 g/dL (ref 31.5–35.7)
MCV: 93 fL (ref 79–97)
Monocytes Absolute: 0.4 10*3/uL (ref 0.1–0.9)
Monocytes: 7 %
Neutrophils Absolute: 2.9 10*3/uL (ref 1.4–7.0)
Neutrophils: 52 %
Platelets: 393 10*3/uL (ref 150–450)
RBC: 3.9 x10E6/uL (ref 3.77–5.28)
RDW: 12 % (ref 11.7–15.4)
WBC: 5.7 10*3/uL (ref 3.4–10.8)

## 2018-08-26 LAB — THYROID PANEL WITH TSH
Free Thyroxine Index: 1.5 (ref 1.2–4.9)
T3 Uptake Ratio: 21 % — ABNORMAL LOW (ref 24–39)
T4, Total: 7.3 ug/dL (ref 4.5–12.0)
TSH: 2.05 u[IU]/mL (ref 0.450–4.500)

## 2018-08-26 LAB — LIPID PANEL
Chol/HDL Ratio: 4.3 ratio (ref 0.0–4.4)
Cholesterol, Total: 251 mg/dL — ABNORMAL HIGH (ref 100–199)
HDL: 59 mg/dL (ref >39)
LDL Calculated: 166 mg/dL — ABNORMAL HIGH (ref 0–99)
Triglycerides: 129 mg/dL (ref 0–149)
VLDL Cholesterol Cal: 26 mg/dL (ref 5–40)

## 2018-08-26 MED ORDER — ROSUVASTATIN CALCIUM 10 MG PO TABS
10.0000 mg | ORAL_TABLET | Freq: Every day | ORAL | 3 refills | Status: DC
Start: 2018-08-26 — End: 2019-02-09

## 2018-08-26 NOTE — Progress Notes (Signed)
Please add HbA1C. Thanks.

## 2018-09-07 ENCOUNTER — Other Ambulatory Visit: Payer: Self-pay

## 2018-09-09 ENCOUNTER — Ambulatory Visit (INDEPENDENT_AMBULATORY_CARE_PROVIDER_SITE_OTHER): Payer: 59 | Admitting: Obstetrics and Gynecology

## 2018-09-09 ENCOUNTER — Other Ambulatory Visit: Payer: Self-pay

## 2018-09-09 ENCOUNTER — Encounter: Payer: Self-pay | Admitting: Obstetrics and Gynecology

## 2018-09-09 VITALS — BP 122/80 | HR 96 | Temp 97.9°F | Ht 64.0 in | Wt 201.0 lb

## 2018-09-09 DIAGNOSIS — Z01419 Encounter for gynecological examination (general) (routine) without abnormal findings: Secondary | ICD-10-CM | POA: Diagnosis not present

## 2018-09-09 NOTE — Progress Notes (Signed)
43 y.o. Ann Thornton Married Black or [place] American Not Hispanic or Latino female here for annual exam. Sexually active, no pain.    Period Cycle (Days): 28 Period Duration (Days): 5 days Period Pattern: Regular Menstrual Flow: Heavy, Moderate Menstrual Control: Tampon Menstrual Control Change Freq (Hours): changes tampon every 3 hours Dysmenorrhea: (!) Mild Dysmenorrhea Symptoms: Cramping  Patient's last menstrual period was 08/12/2018 (approximate).          Sexually active: Yes.    The current method of family planning is none.  Infertility  Exercising: Yes.    walking 2 times a week Smoker:  no  Health Maintenance: Pap:  06-19-16 WNL NEG HR HPV History of abnormal Pap:  Yes- in her teens- had a colposcopy  MMG: 01/16/2018 Birads 1 negative Colonoscopy:  never BMD:   Never TDaP:  01-24-15 Gardasil: no    reports that she has never smoked. She has never used smokeless tobacco. She reports current alcohol use of about 14.0 standard drinks of alcohol per week. She reports that she does not use drugs.  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 Close with her nieces, nephew and step children. From [place], all of her family is here.  Past Medical History:  Diagnosis Date  . Allergy   . Anxiety   . High blood pressure     Past Surgical History:  Procedure Laterality Date  . EYE SURGERY Right 2009   Tightened muscle of eye    Current Outpatient Medications  Medication Sig Dispense Refill  . rosuvastatin (CRESTOR) 10 MG tablet Take 1 tablet (10 mg total) by mouth daily. 90 tablet 3  . amLODipine (NORVASC) 10 MG tablet Take 1 tablet (10 mg total) by mouth daily. 90 tablet 3  . irbesartan (AVAPRO) 150 MG tablet Take 1 tablet (150 mg total) by mouth daily. 90 tablet 3   No current facility-administered medications for this visit.     Family History  Problem Relation Age of Onset  . Hypertension Mother   . Stroke Mother   . Thyroid disease Sister     Review of Systems  Constitutional:  Negative.   HENT: Negative.   Eyes: Negative.   Respiratory: Negative.   Cardiovascular: Negative.   Gastrointestinal: Negative.   Endocrine: Negative.   Genitourinary: Negative.   Musculoskeletal: Negative.   Skin: Negative.   Allergic/Immunologic: Negative.   Neurological: Negative.   Hematological: Negative.   Psychiatric/Behavioral: Negative.     Exam:   BP 122/80 (BP Location: Right Arm, Patient Position: Sitting, Cuff Size: Large)   Pulse 96   Temp 97.9 F (36.6 C) (Skin)   Ht 5\' 4"  (1.626 m)   Wt 201 lb (91.2 kg)   LMP 08/12/2018 (Approximate)   BMI 34.50 kg/m   Weight change: @WEIGHTCHANGE @ Height:   Height: 5\' 4"  (162.6 cm)  Ht Readings from Last 3 Encounters:  09/09/18 5\' 4"  (1.626 m)  08/25/18 5\' 4"  (1.626 m)  07/07/18 5\' 3"  (1.6 m)    General appearance: alert, cooperative and appears stated age Head: Normocephalic, without obvious abnormality, atraumatic Neck: no adenopathy, supple, symmetrical, trachea midline and thyroid normal to inspection and palpation Lungs: clear to auscultation bilaterally Cardiovascular: regular rate and rhythm Breasts: normal appearance, no masses or tenderness Abdomen: soft, non-tender; non distended,  no masses,  no organomegaly Extremities: extremities normal, atraumatic, no cyanosis or edema Skin: Skin color, texture, turgor normal. No rashes or lesions Lymph nodes: Cervical, supraclavicular, and axillary nodes normal. No abnormal inguinal nodes palpated Neurologic: Grossly  normal   Pelvic: External genitalia:  no lesions              Urethra:  normal appearing urethra with no masses, tenderness or lesions              Bartholins and Skenes: normal                 Vagina: normal appearing vagina with normal color and discharge, no lesions              Cervix: no lesions               Bimanual Exam:  Uterus:  normal size, contour, position, consistency, mobility, non-tender              Adnexa: no mass, fullness,  tenderness               Rectovaginal: Confirms               Anus:  normal sphincter tone, no lesions  Chaperone was present for exam.  A:  Well Woman with normal exam  P:   No pap this year  Mammogram in the fall  Discussed breast self exam  Discussed calcium and vit D intake  Labs with primary MD

## 2018-09-09 NOTE — Patient Instructions (Signed)
EXERCISE AND DIET:  We recommended that you start or continue a regular exercise program for good health. Regular exercise means any activity that makes your heart beat faster and makes you sweat.  We recommend exercising at least 30 minutes per day at least 3 days a week, preferably 4 or 5.  We also recommend a diet low in fat and sugar.  Inactivity, poor dietary choices and obesity can cause diabetes, heart attack, stroke, and kidney damage, among others.    ALCOHOL AND SMOKING:  Women should limit their alcohol intake to no more than 7 drinks/beers/glasses of wine (combined, not each!) per week. Moderation of alcohol intake to this level decreases your risk of breast cancer and liver damage. And of course, no recreational drugs are part of a healthy lifestyle.  And absolutely no smoking or even second hand smoke. Most people know smoking can cause heart and lung diseases, but did you know it also contributes to weakening of your bones? Aging of your skin?  Yellowing of your teeth and nails?  CALCIUM AND VITAMIN D:  Adequate intake of calcium and Vitamin D are recommended.  The recommendations for exact amounts of these supplements seem to change often, but generally speaking 1,000 mg of calcium (between diet and supplement) and 800 units of Vitamin D per day seems prudent. Certain women may benefit from higher intake of Vitamin D.  If you are among these women, your doctor will have told you during your visit.    PAP SMEARS:  Pap smears, to check for cervical cancer or precancers,  have traditionally been done yearly, although recent scientific advances have shown that most women can have pap smears less often.  However, every woman still should have a physical exam from her gynecologist every year. It will include a breast check, inspection of the vulva and vagina to check for abnormal growths or skin changes, a visual exam of the cervix, and then an exam to evaluate the size and shape of the uterus and  ovaries.  And after 43 years of age, a rectal exam is indicated to check for rectal cancers. We will also provide age appropriate advice regarding health maintenance, like when you should have certain vaccines, screening for sexually transmitted diseases, bone density testing, colonoscopy, mammograms, etc.   MAMMOGRAMS:  All women over 40 years old should have a yearly mammogram. Many facilities now offer a "3D" mammogram, which may cost around $50 extra out of pocket. If possible,  we recommend you accept the option to have the 3D mammogram performed.  It both reduces the number of women who will be called back for extra views which then turn out to be normal, and it is better than the routine mammogram at detecting truly abnormal areas.    COLON CANCER SCREENING: Now recommend starting at age 45. At this time colonoscopy is not covered for routine screening until 50. There are take home tests that can be done between 45-49.   COLONOSCOPY:  Colonoscopy to screen for colon cancer is recommended for all women at age 50.  We know, you hate the idea of the prep.  We agree, BUT, having colon cancer and not knowing it is worse!!  Colon cancer so often starts as a polyp that can be seen and removed at colonscopy, which can quite literally save your life!  And if your first colonoscopy is normal and you have no family history of colon cancer, most women don't have to have it again for   10 years.  Once every ten years, you can do something that may end up saving your life, right?  We will be happy to help you get it scheduled when you are ready.  Be sure to check your insurance coverage so you understand how much it will cost.  It may be covered as a preventative service at no cost, but you should check your particular policy.      Breast Self-Awareness Breast self-awareness means being familiar with how your breasts look and feel. It involves checking your breasts regularly and reporting any changes to your  health care provider. Practicing breast self-awareness is important. A change in your breasts can be a sign of a serious medical problem. Being familiar with how your breasts look and feel allows you to find any problems early, when treatment is more likely to be successful. All women should practice breast self-awareness, including women who have had breast implants. How to do a breast self-exam One way to learn what is normal for your breasts and whether your breasts are changing is to do a breast self-exam. To do a breast self-exam: Look for Changes  1. Remove all the clothing above your waist. 2. Stand in front of a mirror in a room with good lighting. 3. Put your hands on your hips. 4. Push your hands firmly downward. 5. Compare your breasts in the mirror. Look for differences between them (asymmetry), such as: ? Differences in shape. ? Differences in size. ? Puckers, dips, and bumps in one breast and not the other. 6. Look at each breast for changes in your skin, such as: ? Redness. ? Scaly areas. 7. Look for changes in your nipples, such as: ? Discharge. ? Bleeding. ? Dimpling. ? Redness. ? A change in position. Feel for Changes Carefully feel your breasts for lumps and changes. It is best to do this while lying on your back on the floor and again while sitting or standing in the shower or tub with soapy water on your skin. Feel each breast in the following way:  Place the arm on the side of the breast you are examining above your head.  Feel your breast with the other hand.  Start in the nipple area and make  inch (2 cm) overlapping circles to feel your breast. Use the pads of your three middle fingers to do this. Apply light pressure, then medium pressure, then firm pressure. The light pressure will allow you to feel the tissue closest to the skin. The medium pressure will allow you to feel the tissue that is a little deeper. The firm pressure will allow you to feel the tissue  close to the ribs.  Continue the overlapping circles, moving downward over the breast until you feel your ribs below your breast.  Move one finger-width toward the center of the body. Continue to use the  inch (2 cm) overlapping circles to feel your breast as you move slowly up toward your collarbone.  Continue the up and down exam using all three pressures until you reach your armpit.  Write Down What You Find  Write down what is normal for each breast and any changes that you find. Keep a written record with breast changes or normal findings for each breast. By writing this information down, you do not need to depend only on memory for size, tenderness, or location. Write down where you are in your menstrual cycle, if you are still menstruating. If you are having trouble noticing differences   in your breasts, do not get discouraged. With time you will become more familiar with the variations in your breasts and more comfortable with the exam. How often should I examine my breasts? Examine your breasts every month. If you are breastfeeding, the best time to examine your breasts is after a feeding or after using a breast pump. If you menstruate, the best time to examine your breasts is 5-7 days after your period is over. During your period, your breasts are lumpier, and it may be more difficult to notice changes. When should I see my health care provider? See your health care provider if you notice:  A change in shape or size of your breasts or nipples.  A change in the skin of your breast or nipples, such as a reddened or scaly area.  Unusual discharge from your nipples.  A lump or thick area that was not there before.  Pain in your breasts.  Anything that concerns you.  

## 2019-01-03 ENCOUNTER — Other Ambulatory Visit: Payer: Self-pay | Admitting: Emergency Medicine

## 2019-01-06 ENCOUNTER — Encounter: Payer: Self-pay | Admitting: Emergency Medicine

## 2019-01-06 ENCOUNTER — Other Ambulatory Visit: Payer: Self-pay

## 2019-01-06 ENCOUNTER — Ambulatory Visit (INDEPENDENT_AMBULATORY_CARE_PROVIDER_SITE_OTHER): Payer: 59 | Admitting: Emergency Medicine

## 2019-01-06 VITALS — BP 148/92 | HR 120 | Temp 98.4°F | Resp 18 | Ht 64.0 in | Wt 196.6 lb

## 2019-01-06 DIAGNOSIS — E785 Hyperlipidemia, unspecified: Secondary | ICD-10-CM

## 2019-01-06 DIAGNOSIS — I10 Essential (primary) hypertension: Secondary | ICD-10-CM

## 2019-01-06 NOTE — Progress Notes (Signed)
Ann Thornton 43 y.o.   Chief Complaint  Patient presents with  . Hypertension    follow up 6 months    HISTORY OF PRESENT ILLNESS: This is a 43 y.o. female with history of hypertension here for follow-up.  Presently taking amlodipine 10 mg a irbesartan 150 mg daily. Has no complaints or medical concerns today. BP Readings from Last 3 Encounters:  01/06/19 (!) 148/92  09/09/18 122/80  08/25/18 124/85    HPI   Prior to Admission medications   Medication Sig Start Date End Date Taking? Authorizing Provider  amLODipine (NORVASC) 10 MG tablet TAKE 1 TABLET BY MOUTH  DAILY 01/04/19  Yes Krishang Reading, Ines Bloomer, MD  irbesartan (AVAPRO) 150 MG tablet TAKE 1 TABLET BY MOUTH  DAILY 01/04/19  Yes Chung Chagoya, Ines Bloomer, MD  rosuvastatin (CRESTOR) 10 MG tablet Take 1 tablet (10 mg total) by mouth daily. 08/26/18  Yes SagardiaInes Bloomer, MD    No Known Allergies  Patient Active Problem List   Diagnosis Date Noted  . Mixed hyperlipidemia 07/07/2018  . Anxiety 12/04/2016  . Essential hypertension 03/23/2015  . Anxiety disorder 06/02/2014    Past Medical History:  Diagnosis Date  . Allergy   . Anxiety   . High blood pressure     Past Surgical History:  Procedure Laterality Date  . EYE SURGERY Right 2009   Tightened muscle of eye    Social History   Socioeconomic History  . Marital status: Married    Spouse name: Not on file  . Number of children: Not on file  . Years of education: Not on file  . Highest education level: Not on file  Occupational History  . Occupation: Research scientist (physical sciences): [employer]  . Financial resource strain: Not on file  . Food insecurity    Worry: Not on file    Inability: Not on file  . Transportation needs    Medical: Not on file    Non-medical: Not on file  Tobacco Use  . Smoking status: Never Smoker  . Smokeless tobacco: Never Used  Substance and Sexual Activity  . Alcohol use: Yes    Alcohol/week:  14.0 standard drinks    Types: 14 Cans of beer per week    Comment: 3 beers a day  . Drug use: No  . Sexual activity: Yes    Partners: Male    Birth control/protection: None  Lifestyle  . Physical activity    Days per week: Not on file    Minutes per session: Not on file  . Stress: Not on file  Relationships  . Social Herbalist on phone: Not on file    Gets together: Not on file    Attends religious service: Not on file    Active member of club or organization: Not on file    Attends meetings of clubs or organizations: Not on file    Relationship status: Not on file  . Intimate partner violence    Fear of current or ex partner: Not on file    Emotionally abused: Not on file    Physically abused: Not on file    Forced sexual activity: Not on file  Other Topics Concern  . Not on file  Social History Narrative   Single without children   Working from home   Exercise - zumba once a week, fitness workout on Mondays   Diet - trying to be good, eating  no salt.          As of 11/08/15:      Diet: N/A   Caffeine: No Sodas   Married: Engaged   House: Lives in apartment, 2 stories, 1 person    Pets: None   Current/Past profession: Data Entry    Exercise: No    Living Will: No    DNR: No    POA/HPOA: No        Family History  Problem Relation Age of Onset  . Hypertension Mother   . Stroke Mother   . Thyroid disease Sister      Review of Systems  Constitutional: Negative.  Negative for fever.  HENT: Negative.  Negative for congestion and sore throat.   Eyes: Negative.   Respiratory: Negative.  Negative for cough and shortness of breath.   Cardiovascular: Negative.  Negative for chest pain and palpitations.  Gastrointestinal: Negative.  Negative for abdominal pain, diarrhea, nausea and vomiting.  Genitourinary: Negative.   Musculoskeletal: Negative.   Skin: Negative.   Neurological: Negative for dizziness and headaches.  All other systems reviewed and  are negative.   Vitals:   01/06/19 1405  BP: (!) 148/92  Pulse: (!) 120  Resp: 18  Temp: 98.4 F (36.9 C)  SpO2: 98%  Repeat blood pressure 140/76.  Physical Exam Vitals signs reviewed.  Constitutional:      Appearance: Normal appearance. She is obese.  HENT:     Head: Normocephalic.  Eyes:     Extraocular Movements: Extraocular movements intact.     Conjunctiva/sclera: Conjunctivae normal.     Pupils: Pupils are equal, round, and reactive to light.  Neck:     Musculoskeletal: Normal range of motion and neck supple.  Cardiovascular:     Rate and Rhythm: Normal rate and regular rhythm.     Heart sounds: Murmur (Systolic grade 1-2 best heard on right sternal border) present.  Pulmonary:     Effort: Pulmonary effort is normal.     Breath sounds: Normal breath sounds.  Abdominal:     Palpations: Abdomen is soft.     Tenderness: There is no abdominal tenderness.  Musculoskeletal: Normal range of motion.  Skin:    General: Skin is warm and dry.     Capillary Refill: Capillary refill takes less than 2 seconds.  Neurological:     General: No focal deficit present.     Mental Status: She is alert and oriented to person, place, and time.  Psychiatric:        Mood and Affect: Mood normal.        Behavior: Behavior normal.      ASSESSMENT & PLAN: A total of 25 minutes was spent in the room with the patient, greater than 50% of which was in counseling/coordination of care regarding hypertension, diet and nutrition, importance of physical exercise, medications, doses and side effects, review of blood work done last May, cardiovascular risks associated with hypertension, prognosis, and need for follow-up.  Health maintenance items reviewed.  Ann Thornton was seen today for hypertension.  Diagnoses and all orders for this visit:  Essential hypertension  Dyslipidemia    Patient Instructions       If you have lab work done today you will be contacted with your lab results  within the next 2 weeks.  If you have not heard from [place] then please contact us. The fastest way to get your results is to register for My Chart.   IF you received an  x-ray today, you will receive an invoice from [facility]. Please contact [facility] at [phone] with questions or concerns regarding your invoice.   IF you received labwork today, you will receive an invoice from [facility]. Please contact [facility] at [phone] with questions or concerns regarding your invoice.   Our billing staff will not be able to assist you with questions regarding bills from these companies.  You will be contacted with the lab results as soon as they are available. The fastest way to get your results is to activate your My Chart account. Instructions are located on the last page of this paperwork. If you have not heard from [place] regarding the results in 2 weeks, please contact this office.     Hypertension, Adult High blood pressure (hypertension) is when the force of blood pumping through the arteries is too strong. The arteries are the blood vessels that carry blood from the heart throughout the body. Hypertension forces the heart to work harder to pump blood and may cause arteries to become narrow or stiff. Untreated or uncontrolled hypertension can cause a heart attack, heart failure, a stroke, kidney disease, and other problems. A blood pressure reading consists of a higher number over a lower number. Ideally, your blood pressure should be below 120/80. The first ("top") number is called the systolic pressure. It is a measure of the pressure in your arteries as your heart beats. The second ("bottom") number is called the diastolic pressure. It is a measure of the pressure in your arteries as the heart relaxes. What are the causes? The exact cause of this condition is not known. There are some conditions that result in or are related to high blood pressure. What increases the risk? Some  risk factors for high blood pressure are under your control. The following factors may make you more likely to develop this condition:  Smoking.  Having type 2 diabetes mellitus, high cholesterol, or both.  Not getting enough exercise or physical activity.  Being overweight.  Having too much fat, sugar, calories, or salt (sodium) in your diet.  Drinking too much alcohol. Some risk factors for high blood pressure may be difficult or impossible to change. Some of these factors include:  Having chronic kidney disease.  Having a family history of high blood pressure.  Age. Risk increases with age.  Race. You may be at higher risk if you are African American.  Gender. Men are at higher risk than women before age 49. After age 70, women are at higher risk than men.  Having obstructive sleep apnea.  Stress. What are the signs or symptoms? High blood pressure may not cause symptoms. Very high blood pressure (hypertensive crisis) may cause:  Headache.  Anxiety.  Shortness of breath.  Nosebleed.  Nausea and vomiting.  Vision changes.  Severe chest pain.  Seizures. How is this diagnosed? This condition is diagnosed by measuring your blood pressure while you are seated, with your arm resting on a flat surface, your legs uncrossed, and your feet flat on the floor. The cuff of the blood pressure monitor will be placed directly against the skin of your upper arm at the level of your heart. It should be measured at least twice using the same arm. Certain conditions can cause a difference in blood pressure between your right and left arms. Certain factors can cause blood pressure readings to be lower or higher than normal for a short period of time:  When your blood pressure is higher when  you are in a health care provider's office than when you are at home, this is called white coat hypertension. Most people with this condition do not need medicines.  When your blood pressure is  higher at home than when you are in a health care provider's office, this is called masked hypertension. Most people with this condition may need medicines to control blood pressure. If you have a high blood pressure reading during one visit or you have normal blood pressure with other risk factors, you may be asked to:  Return on a different day to have your blood pressure checked again.  Monitor your blood pressure at home for 1 week or longer. If you are diagnosed with hypertension, you may have other blood or imaging tests to help your health care provider understand your overall risk for other conditions. How is this treated? This condition is treated by making healthy lifestyle changes, such as eating healthy foods, exercising more, and reducing your alcohol intake. Your health care provider may prescribe medicine if lifestyle changes are not enough to get your blood pressure under control, and if:  Your systolic blood pressure is above 130.  Your diastolic blood pressure is above 80. Your personal target blood pressure may vary depending on your medical conditions, your age, and other factors. Follow these instructions at home: Eating and drinking   Eat a diet that is high in fiber and potassium, and low in sodium, added sugar, and fat. An example eating plan is called the DASH (Dietary Approaches to Stop Hypertension) diet. To eat this way: ? Eat plenty of fresh fruits and vegetables. Try to fill one half of your plate at each meal with fruits and vegetables. ? Eat whole grains, such as whole-wheat pasta, brown rice, or whole-grain bread. Fill about one fourth of your plate with whole grains. ? Eat or drink low-fat dairy products, such as skim milk or low-fat yogurt. ? Avoid fatty cuts of meat, processed or cured meats, and poultry with skin. Fill about one fourth of your plate with lean proteins, such as fish, chicken without skin, beans, eggs, or tofu. ? Avoid pre-made and processed  foods. These tend to be higher in sodium, added sugar, and fat.  Reduce your daily sodium intake. Most people with hypertension should eat less than 1,500 mg of sodium a day.  Do not drink alcohol if: ? Your health care provider tells you not to drink. ? You are pregnant, may be pregnant, or are planning to become pregnant.  If you drink alcohol: ? Limit how much you use to:  0-1 drink a day for women.  0-2 drinks a day for men. ? Be aware of how much alcohol is in your drink. In the [place] one drink equals one 12 oz bottle of beer (355 mL), one 5 oz glass of wine (148 mL), or one 1 oz glass of hard liquor (44 mL). Lifestyle   Work with your health care provider to maintain a healthy body weight or to lose weight. Ask what an ideal weight is for you.  Get at least 30 minutes of exercise most days of the week. Activities may include walking, swimming, or biking.  Include exercise to strengthen your muscles (resistance exercise), such as Pilates or lifting weights, as part of your weekly exercise routine. Try to do these types of exercises for 30 minutes at least 3 days a week.  Do not use any products that contain nicotine or tobacco, such as cigarettes, e-cigarettes,  and chewing tobacco. If you need help quitting, ask your health care provider.  Monitor your blood pressure at home as told by your health care provider.  Keep all follow-up visits as told by your health care provider. This is important. Medicines  Take over-the-counter and prescription medicines only as told by your health care provider. Follow directions carefully. Blood pressure medicines must be taken as prescribed.  Do not skip doses of blood pressure medicine. Doing this puts you at risk for problems and can make the medicine less effective.  Ask your health care provider about side effects or reactions to medicines that you should watch for. Contact a health care provider if you:  Think you are having a  reaction to a medicine you are taking.  Have headaches that keep coming back (recurring).  Feel dizzy.  Have swelling in your ankles.  Have trouble with your vision. Get help right away if you:  Develop a severe headache or confusion.  Have unusual weakness or numbness.  Feel faint.  Have severe pain in your chest or abdomen.  Vomit repeatedly.  Have trouble breathing. Summary  Hypertension is when the force of blood pumping through your arteries is too strong. If this condition is not controlled, it may put you at risk for serious complications.  Your personal target blood pressure may vary depending on your medical conditions, your age, and other factors. For most people, a normal blood pressure is less than 120/80.  Hypertension is treated with lifestyle changes, medicines, or a combination of both. Lifestyle changes include losing weight, eating a healthy, low-sodium diet, exercising more, and limiting alcohol. This information is not intended to replace advice given to you by your health care provider. Make sure you discuss any questions you have with your health care provider. Document Released: 04/08/2005 Document Revised: 12/17/2017 Document Reviewed: 12/17/2017 Elsevier Patient Education  2020 Elsevier Inc.     Agustina Caroli, MD [facility]

## 2019-01-06 NOTE — Patient Instructions (Addendum)
   If you have lab work done today you will be contacted with your lab results within the next 2 weeks.  If you have not heard from us then please contact us. The fastest way to get your results is to register for My Chart.   IF you received an x-ray today, you will receive an invoice from [facility]. Please contact [facility] at [phone] with questions or concerns regarding your invoice.   IF you received labwork today, you will receive an invoice from [facility]. Please contact [facility] at [phone] with questions or concerns regarding your invoice.   Our billing staff will not be able to assist you with questions regarding bills from these companies.  You will be contacted with the lab results as soon as they are available. The fastest way to get your results is to activate your My Chart account. Instructions are located on the last page of this paperwork. If you have not heard from us regarding the results in 2 weeks, please contact this office.     Hypertension, Adult High blood pressure (hypertension) is when the force of blood pumping through the arteries is too strong. The arteries are the blood vessels that carry blood from the heart throughout the body. Hypertension forces the heart to work harder to pump blood and may cause arteries to become narrow or stiff. Untreated or uncontrolled hypertension can cause a heart attack, heart failure, a stroke, kidney disease, and other problems. A blood pressure reading consists of a higher number over a lower number. Ideally, your blood pressure should be below 120/80. The first ("top") number is called the systolic pressure. It is a measure of the pressure in your arteries as your heart beats. The second ("bottom") number is called the diastolic pressure. It is a measure of the pressure in your arteries as the heart relaxes. What are the causes? The exact cause of this condition is not known. There are some conditions  that result in or are related to high blood pressure. What increases the risk? Some risk factors for high blood pressure are under your control. The following factors may make you more likely to develop this condition:  Smoking.  Having type 2 diabetes mellitus, high cholesterol, or both.  Not getting enough exercise or physical activity.  Being overweight.  Having too much fat, sugar, calories, or salt (sodium) in your diet.  Drinking too much alcohol. Some risk factors for high blood pressure may be difficult or impossible to change. Some of these factors include:  Having chronic kidney disease.  Having a family history of high blood pressure.  Age. Risk increases with age.  Race. You may be at higher risk if you are African American.  Gender. Men are at higher risk than women before age 45. After age 65, women are at higher risk than men.  Having obstructive sleep apnea.  Stress. What are the signs or symptoms? High blood pressure may not cause symptoms. Very high blood pressure (hypertensive crisis) may cause:  Headache.  Anxiety.  Shortness of breath.  Nosebleed.  Nausea and vomiting.  Vision changes.  Severe chest pain.  Seizures. How is this diagnosed? This condition is diagnosed by measuring your blood pressure while you are seated, with your arm resting on a flat surface, your legs uncrossed, and your feet flat on the floor. The cuff of the blood pressure monitor will be placed directly against the skin of your upper arm at the level of your heart.   It should be measured at least twice using the same arm. Certain conditions can cause a difference in blood pressure between your right and left arms. Certain factors can cause blood pressure readings to be lower or higher than normal for a short period of time:  When your blood pressure is higher when you are in a health care provider's office than when you are at home, this is called white coat hypertension.  Most people with this condition do not need medicines.  When your blood pressure is higher at home than when you are in a health care provider's office, this is called masked hypertension. Most people with this condition may need medicines to control blood pressure. If you have a high blood pressure reading during one visit or you have normal blood pressure with other risk factors, you may be asked to:  Return on a different day to have your blood pressure checked again.  Monitor your blood pressure at home for 1 week or longer. If you are diagnosed with hypertension, you may have other blood or imaging tests to help your health care provider understand your overall risk for other conditions. How is this treated? This condition is treated by making healthy lifestyle changes, such as eating healthy foods, exercising more, and reducing your alcohol intake. Your health care provider may prescribe medicine if lifestyle changes are not enough to get your blood pressure under control, and if:  Your systolic blood pressure is above 130.  Your diastolic blood pressure is above 80. Your personal target blood pressure may vary depending on your medical conditions, your age, and other factors. Follow these instructions at home: Eating and drinking   Eat a diet that is high in fiber and potassium, and low in sodium, added sugar, and fat. An example eating plan is called the DASH (Dietary Approaches to Stop Hypertension) diet. To eat this way: ? Eat plenty of fresh fruits and vegetables. Try to fill one half of your plate at each meal with fruits and vegetables. ? Eat whole grains, such as whole-wheat pasta, brown rice, or whole-grain bread. Fill about one fourth of your plate with whole grains. ? Eat or drink low-fat dairy products, such as skim milk or low-fat yogurt. ? Avoid fatty cuts of meat, processed or cured meats, and poultry with skin. Fill about one fourth of your plate with lean proteins, such  as fish, chicken without skin, beans, eggs, or tofu. ? Avoid pre-made and processed foods. These tend to be higher in sodium, added sugar, and fat.  Reduce your daily sodium intake. Most people with hypertension should eat less than 1,500 mg of sodium a day.  Do not drink alcohol if: ? Your health care provider tells you not to drink. ? You are pregnant, may be pregnant, or are planning to become pregnant.  If you drink alcohol: ? Limit how much you use to:  0-1 drink a day for women.  0-2 drinks a day for men. ? Be aware of how much alcohol is in your drink. In the [place] one drink equals one 12 oz bottle of beer (355 mL), one 5 oz glass of wine (148 mL), or one 1 oz glass of hard liquor (44 mL). Lifestyle   Work with your health care provider to maintain a healthy body weight or to lose weight. Ask what an ideal weight is for you.  Get at least 30 minutes of exercise most days of the week. Activities may include walking, swimming, or   biking.  Include exercise to strengthen your muscles (resistance exercise), such as Pilates or lifting weights, as part of your weekly exercise routine. Try to do these types of exercises for 30 minutes at least 3 days a week.  Do not use any products that contain nicotine or tobacco, such as cigarettes, e-cigarettes, and chewing tobacco. If you need help quitting, ask your health care provider.  Monitor your blood pressure at home as told by your health care provider.  Keep all follow-up visits as told by your health care provider. This is important. Medicines  Take over-the-counter and prescription medicines only as told by your health care provider. Follow directions carefully. Blood pressure medicines must be taken as prescribed.  Do not skip doses of blood pressure medicine. Doing this puts you at risk for problems and can make the medicine less effective.  Ask your health care provider about side effects or reactions to medicines that you  should watch for. Contact a health care provider if you:  Think you are having a reaction to a medicine you are taking.  Have headaches that keep coming back (recurring).  Feel dizzy.  Have swelling in your ankles.  Have trouble with your vision. Get help right away if you:  Develop a severe headache or confusion.  Have unusual weakness or numbness.  Feel faint.  Have severe pain in your chest or abdomen.  Vomit repeatedly.  Have trouble breathing. Summary  Hypertension is when the force of blood pumping through your arteries is too strong. If this condition is not controlled, it may put you at risk for serious complications.  Your personal target blood pressure may vary depending on your medical conditions, your age, and other factors. For most people, a normal blood pressure is less than 120/80.  Hypertension is treated with lifestyle changes, medicines, or a combination of both. Lifestyle changes include losing weight, eating a healthy, low-sodium diet, exercising more, and limiting alcohol. This information is not intended to replace advice given to you by your health care provider. Make sure you discuss any questions you have with your health care provider. Document Released: 04/08/2005 Document Revised: 12/17/2017 Document Reviewed: 12/17/2017 Elsevier Patient Education  2020 Elsevier Inc.  

## 2019-01-06 NOTE — Assessment & Plan Note (Signed)
Slightly elevated blood pressure.  Normal readings at home.  Continue present medications.  Diet, nutrition and physical exercise encouraged and discussed.  Follow-up in 6 months.

## 2019-01-20 LAB — HM MAMMOGRAPHY

## 2019-01-21 ENCOUNTER — Encounter: Payer: Self-pay | Admitting: *Deleted

## 2019-01-26 ENCOUNTER — Encounter: Payer: Self-pay | Admitting: *Deleted

## 2019-02-08 ENCOUNTER — Encounter: Payer: Self-pay | Admitting: Emergency Medicine

## 2019-02-09 ENCOUNTER — Other Ambulatory Visit: Payer: Self-pay | Admitting: Emergency Medicine

## 2019-02-09 ENCOUNTER — Encounter: Payer: Self-pay | Admitting: *Deleted

## 2019-02-09 DIAGNOSIS — E785 Hyperlipidemia, unspecified: Secondary | ICD-10-CM

## 2019-02-09 MED ORDER — EZETIMIBE 10 MG PO TABS: 10.0000 mg | ORAL_TABLET | Freq: Every day | ORAL | 3 refills | Status: DC

## 2019-02-09 NOTE — Telephone Encounter (Signed)
Good idea to stop the medication.  Probably intolerant to statins in general.  I will try her on a different category of medication like Zetia 10 mg daily.  Thanks.

## 2019-02-23 ENCOUNTER — Ambulatory Visit: Payer: 59 | Admitting: Emergency Medicine

## 2019-05-10 ENCOUNTER — Encounter: Payer: Self-pay | Admitting: Emergency Medicine

## 2019-05-10 ENCOUNTER — Other Ambulatory Visit: Payer: Self-pay

## 2019-05-10 ENCOUNTER — Ambulatory Visit (INDEPENDENT_AMBULATORY_CARE_PROVIDER_SITE_OTHER): Payer: 59 | Admitting: Emergency Medicine

## 2019-05-10 VITALS — BP 147/88 | HR 116 | Temp 98.2°F | Resp 16 | Ht 64.0 in | Wt 192.0 lb

## 2019-05-10 DIAGNOSIS — R42 Dizziness and giddiness: Secondary | ICD-10-CM | POA: Diagnosis not present

## 2019-05-10 DIAGNOSIS — Z131 Encounter for screening for diabetes mellitus: Secondary | ICD-10-CM | POA: Diagnosis not present

## 2019-05-10 DIAGNOSIS — Z23 Encounter for immunization: Secondary | ICD-10-CM

## 2019-05-10 DIAGNOSIS — I10 Essential (primary) hypertension: Secondary | ICD-10-CM

## 2019-05-10 DIAGNOSIS — E785 Hyperlipidemia, unspecified: Secondary | ICD-10-CM | POA: Diagnosis not present

## 2019-05-10 DIAGNOSIS — Z1329 Encounter for screening for other suspected endocrine disorder: Secondary | ICD-10-CM | POA: Diagnosis not present

## 2019-05-10 DIAGNOSIS — F1010 Alcohol abuse, uncomplicated: Secondary | ICD-10-CM

## 2019-05-10 LAB — POCT CBC
Granulocyte percent: 48.9 %G (ref 37–80)
HCT, POC: 37.1 % (ref 29–41)
Hemoglobin: 12.4 g/dL (ref 11–14.6)
Lymph, poc: 3 (ref 0.6–3.4)
MCH, POC: 31.3 pg — AB (ref 27–31.2)
MCHC: 33.4 g/dL (ref 31.8–35.4)
MCV: 93.7 fL (ref 76–111)
MID (cbc): 0.5 (ref 0–0.9)
MPV: 8.3 fL (ref 0–99.8)
POC Granulocyte: 3.4 (ref 2–6.9)
POC LYMPH PERCENT: 44 %L (ref 10–50)
POC MID %: 7.1 %M (ref 0–12)
Platelet Count, POC: 331 10*3/uL (ref 142–424)
RBC: 3.96 M/uL — AB (ref 4.04–5.48)
RDW, POC: 12.5 %
WBC: 6.9 10*3/uL (ref 4.6–10.2)

## 2019-05-10 LAB — POCT GLYCOSYLATED HEMOGLOBIN (HGB A1C): Hemoglobin A1C: 5.7 % — AB (ref 4.0–5.6)

## 2019-05-10 LAB — GLUCOSE, POCT (MANUAL RESULT ENTRY): POC Glucose: 105 mg/dl — AB (ref 70–99)

## 2019-05-10 MED ORDER — ATENOLOL 50 MG PO TABS: 50.0000 mg | ORAL_TABLET | Freq: Every day | ORAL | 3 refills | Status: DC

## 2019-05-10 NOTE — Progress Notes (Signed)
Ann Thornton 44 y.o.   Chief Complaint  Patient presents with  . Dizziness    x 3 week with lightheadness and hands are shaky, per patient eating helps it   BP Readings from Last 3 Encounters:  05/10/19 (!) 147/88  01/06/19 (!) 148/92  09/09/18 122/80     HISTORY OF PRESENT ILLNESS: This is a 44 y.o. female complaining of intermittent episodes of lightheadedness and dizziness for the past several weeks mostly in the morning before breakfast.  Has history of hypertension.  Presently taking amlodipine 10 mg and irbesartan 150 mg daily.  Has history of dyslipidemia on Zetia 10 mg daily.  Unexpectedly lost her husband last November.  Increased emotional stress has led to increased daily EtOH consumption, 5 shots of vodka daily. Denies rectal bleeding or urinary symptoms.  Denies fever or chills.  Denies flulike symptoms.  Denies syncope, nausea or vomiting.  Still able to eat and drink.  Sometimes she gets shaky and eating helps.  No history of diabetes. No other significant symptoms.  No other complaints or medical concerns today.  HPI   Prior to Admission medications   Medication Sig Start Date End Date Taking? Authorizing Provider  amLODipine (NORVASC) 10 MG tablet TAKE 1 TABLET BY MOUTH  DAILY 01/04/19  Yes Evone Arseneau, Ines Bloomer, MD  ezetimibe (ZETIA) 10 MG tablet Take 1 tablet (10 mg total) by mouth daily. 02/09/19  Yes Horald Pollen, MD  irbesartan (AVAPRO) 150 MG tablet TAKE 1 TABLET BY MOUTH  DAILY 01/04/19  Yes Horald Pollen, MD    No Known Allergies  Patient Active Problem List   Diagnosis Date Noted  . Mixed hyperlipidemia 07/07/2018  . Essential hypertension 03/23/2015  . Anxiety disorder 06/02/2014    Past Medical History:  Diagnosis Date  . Allergy   . Anxiety   . High blood pressure     Past Surgical History:  Procedure Laterality Date  . EYE SURGERY Right 2009   Tightened muscle of eye    Social History   Socioeconomic History  .  Marital status: Married    Spouse name: Not on file  . Number of children: Not on file  . Years of education: Not on file  . Highest education level: Not on file  Occupational History  . Occupation: Research scientist (physical sciences): [employer]  Tobacco Use  . Smoking status: Never Smoker  . Smokeless tobacco: Never Used  Substance and Sexual Activity  . Alcohol use: Yes    Alcohol/week: 14.0 standard drinks    Types: 14 Cans of beer per week    Comment: 3 beers a day  . Drug use: No  . Sexual activity: Yes    Partners: Male    Birth control/protection: None  Other Topics Concern  . Not on file  Social History Narrative   Single without children   Working from home   Exercise - zumba once a week, fitness workout on Mondays   Diet - trying to be good, eating no salt.          As of 11/08/15:      Diet: N/A   Caffeine: No Sodas   Married: Engaged   House: Lives in apartment, 2 stories, 1 person    Pets: None   Current/Past profession: Data Entry    Exercise: No    Living Will: No    DNR: No    POA/HPOA: No       Social  Determinants of Health   Financial Resource Strain:   . Difficulty of Paying Living Expenses: Not on file  Food Insecurity:   . Worried About Charity fundraiser in the Last Year: Not on file  . Ran Out of Food in the Last Year: Not on file  Transportation Needs:   . Lack of Transportation (Medical): Not on file  . Lack of Transportation (Non-Medical): Not on file  Physical Activity:   . Days of Exercise per Week: Not on file  . Minutes of Exercise per Session: Not on file  Stress:   . Feeling of Stress : Not on file  Social Connections:   . Frequency of Communication with Friends and Family: Not on file  . Frequency of Social Gatherings with Friends and Family: Not on file  . Attends Religious Services: Not on file  . Active Member of Clubs or Organizations: Not on file  . Attends Archivist Meetings: Not on file  . Marital  Status: Not on file  Intimate Partner Violence:   . Fear of Current or Ex-Partner: Not on file  . Emotionally Abused: Not on file  . Physically Abused: Not on file  . Sexually Abused: Not on file    Family History  Problem Relation Age of Onset  . Hypertension Mother   . Stroke Mother   . Thyroid disease Sister      Review of Systems  Constitutional: Negative.  Negative for chills and fever.  HENT: Negative.  Negative for congestion and sore throat.   Eyes: Negative.  Negative for blurred vision and double vision.  Respiratory: Negative.  Negative for cough and shortness of breath.   Cardiovascular: Negative.  Negative for chest pain, palpitations and leg swelling.  Gastrointestinal: Negative.  Negative for abdominal pain, blood in stool, melena, nausea and vomiting.  Genitourinary: Negative.  Negative for dysuria and hematuria.  Musculoskeletal: Negative.  Negative for myalgias and neck pain.  Skin: Negative.  Negative for rash.  Neurological: Positive for dizziness.  Endo/Heme/Allergies: Negative.   All other systems reviewed and are negative.   Today's Vitals   05/10/19 1608  BP: (!) 147/88  Pulse: (!) 116  Resp: 16  Temp: 98.2 F (36.8 C)  TempSrc: Temporal  SpO2: 97%  Weight: 192 lb (87.1 kg)  Height: 5\' 4"  (1.626 m)   Body mass index is 32.96 kg/m. Orthostatic VS for the past 24 hrs (Last 3 readings):  BP- Lying Pulse- Lying BP- Sitting Pulse- Sitting BP- Standing at 0 minutes Pulse- Standing at 0 minutes  05/10/19 1706 131/84 92 135/87 104 (!) 143/91 107    Physical Exam Vitals reviewed.  Constitutional:      Appearance: Normal appearance.  HENT:     Head: Normocephalic.  Eyes:     Extraocular Movements: Extraocular movements intact.     Conjunctiva/sclera: Conjunctivae normal.     Pupils: Pupils are equal, round, and reactive to light.  Cardiovascular:     Rate and Rhythm: Tachycardia present.     Pulses: Normal pulses.     Heart sounds: Normal  heart sounds.  Pulmonary:     Effort: Pulmonary effort is normal.     Breath sounds: Normal breath sounds.  Musculoskeletal:        General: Normal range of motion.     Cervical back: Normal range of motion and neck supple.  Skin:    General: Skin is warm and dry.     Capillary Refill: Capillary refill takes less  than 2 seconds.  Neurological:     General: No focal deficit present.     Mental Status: She is alert and oriented to person, place, and time.    Results for orders placed or performed in visit on 05/10/19 (from the past 24 hour(s))  POCT glucose (manual entry)     Status: Abnormal   Collection Time: 05/10/19  4:48 PM  Result Value Ref Range   POC Glucose 105 (A) 70 - 99 mg/dl  POCT CBC     Status: Abnormal   Collection Time: 05/10/19  4:49 PM  Result Value Ref Range   WBC 6.9 4.6 - 10.2 K/uL   Lymph, poc 3.0 0.6 - 3.4   POC LYMPH PERCENT 44.0 10 - 50 %L   MID (cbc) 0.5 0 - 0.9   POC MID % 7.1 0 - 12 %M   POC Granulocyte 3.4 2 - 6.9   Granulocyte percent 48.9 37 - 80 %G   RBC 3.96 (A) 4.04 - 5.48 M/uL   Hemoglobin 12.4 11 - 14.6 g/dL   HCT, POC 37.1 29 - 41 %   MCV 93.7 76 - 111 fL   MCH, POC 31.3 (A) 27 - 31.2 pg   MCHC 33.4 31.8 - 35.4 g/dL   RDW, POC 12.5 %   Platelet Count, POC 331 142 - 424 K/uL   MPV 8.3 0 - 99.8 fL  POCT glycosylated hemoglobin (Hb A1C)     Status: Abnormal   Collection Time: 05/10/19  4:50 PM  Result Value Ref Range   Hemoglobin A1C 5.7 (A) 4.0 - 5.6 %   HbA1c POC (<> result, manual entry)     HbA1c, POC (prediabetic range)     HbA1c, POC (controlled diabetic range)     EKG: Normal sinus rhythm with ventricular rate at 100/min.  No acute ischemic changes.  Nonspecific T wave changes.  A total of 30 minutes was spent in the room with the patient, greater than 50% of which was in counseling/coordination of care regarding differential diagnosis of dizziness and lightheadedness, review of most recent blood work and most recent office  visit notes, review of EKG, diagnostic approach, diet and nutrition, need to decrease alcohol intake, review of new medication beta-blocker and side effects, prognosis and need for follow-up in 4 weeks.  ASSESSMENT & PLAN: Jonika was seen today for dizziness.  Diagnoses and all orders for this visit:  Dizziness -     POCT CBC -     EKG 12-Lead -     Orthostatic vital signs  Dyslipidemia -     Lipid panel  Essential hypertension -     Comprehensive metabolic panel -     atenolol (TENORMIN) 50 MG tablet; Take 1 tablet (50 mg total) by mouth daily.  Thyroid disorder screen -     Thyroid Panel With TSH  Screening for diabetes mellitus -     POCT glucose (manual entry) -     POCT glycosylated hemoglobin (Hb A1C)  Need for prophylactic vaccination and inoculation against influenza  Excessive drinking of alcohol    Patient Instructions       If you have lab work done today you will be contacted with your lab results within the next 2 weeks.  If you have not heard from [place] then please contact us. The fastest way to get your results is to register for My Chart.   IF you received an x-ray today, you will receive an invoice from [facility]. Please contact [facility] at [phone] with questions or concerns regarding your invoice.   IF you received labwork today, you will receive an invoice from [facility]. Please contact [facility] at [phone] with questions or concerns regarding your invoice.   Our billing staff will not be able to assist you with questions regarding bills from these companies.  You will be contacted with the lab results as soon as they are available. The fastest way to get your results is to activate your My Chart account. Instructions are located on the last page of this paperwork. If you have not heard from [place] regarding the results in 2 weeks, please contact this office.     Dizziness Dizziness is a common problem. It makes you feel  unsteady or light-headed. You may feel like you are about to pass out (faint). Dizziness can lead to getting hurt if you stumble or fall. Dizziness can be caused by many things, including:  Medicines.  Not having enough water in your body (dehydration).  Illness. Follow these instructions at home: Eating and drinking   Drink enough fluid to keep your pee (urine) clear or pale yellow. This helps to keep you from getting dehydrated. Try to drink more clear fluids, such as water.  Do not drink alcohol.  Limit how much caffeine you drink or eat, if your doctor tells you to do that.  Limit how much salt (sodium) you drink or eat, if your doctor tells you to do that. Activity   Avoid making quick movements. ? When you stand up from sitting in a chair, steady yourself until you feel okay. ? In the morning, first sit up on the side of the bed. When you feel okay, stand slowly while you hold onto something. Do this until you know that your balance is fine.  If you need to stand in one place for a long time, move your legs often. Tighten and relax the muscles in your legs while you are standing.  Do not drive or use heavy machinery if you feel dizzy.  Avoid bending down if you feel dizzy. Place items in your home so you can reach them easily without leaning over. Lifestyle  Do not use any products that contain nicotine or tobacco, such as cigarettes and e-cigarettes. If you need help quitting, ask your doctor.  Try to lower your stress level. You can do this by using methods such as yoga or meditation. Talk with your doctor if you need help. General instructions  Watch your dizziness for any changes.  Take over-the-counter and prescription medicines only as told by your doctor. Talk with your doctor if you think that you are dizzy because of a medicine that you are taking.  Tell a friend or a family member that you are feeling dizzy. If he or she notices any changes in your behavior,  have this person call your doctor.  Keep all follow-up visits as told by your doctor. This is important. Contact a doctor if:  Your dizziness does not go away.  Your dizziness or light-headedness gets worse.  You feel sick to your stomach (nauseous).  You have trouble hearing.  You have new symptoms.  You are unsteady on your feet.  You feel like the room is spinning. Get help right away if:  You throw up (vomit) or have watery poop (diarrhea), and you cannot eat or drink anything.  You have trouble: ? Talking. ? Walking. ? Swallowing. ? Using your arms, hands, or legs.  You feel generally weak.  You are not thinking clearly, or you have trouble forming sentences. A friend or family member may notice this.  You have: ? Chest pain. ? Pain in your belly (abdomen). ? Shortness of breath. ? Sweating.  Your vision changes.  You are bleeding.  You have a very bad headache.  You have neck pain or a stiff neck.  You have a fever. These symptoms may be an emergency. Do not wait to see if the symptoms will go away. Get medical help right away. Call your local emergency services (911 in the [place] Do not drive yourself to the hospital. Summary  Dizziness makes you feel unsteady or light-headed. You may feel like you are about to pass out (faint).  Drink enough fluid to keep your pee (urine) clear or pale yellow. Do not drink alcohol.  Avoid making quick movements if you feel dizzy.  Watch your dizziness for any changes. This information is not intended to replace advice given to you by your health care provider. Make sure you discuss any questions you have with your health care provider. Document Revised: 04/11/2017 Document Reviewed: 04/25/2016 Elsevier Patient Education  2020 Elsevier Inc.      Agustina Caroli, MD [facility]

## 2019-05-10 NOTE — Patient Instructions (Addendum)
   If you have lab work done today you will be contacted with your lab results within the next 2 weeks.  If you have not heard from us then please contact us. The fastest way to get your results is to register for My Chart.   IF you received an x-ray today, you will receive an invoice from [facility]. Please contact [facility] at [phone] with questions or concerns regarding your invoice.   IF you received labwork today, you will receive an invoice from [facility]. Please contact [facility] at [phone] with questions or concerns regarding your invoice.   Our billing staff will not be able to assist you with questions regarding bills from these companies.  You will be contacted with the lab results as soon as they are available. The fastest way to get your results is to activate your My Chart account. Instructions are located on the last page of this paperwork. If you have not heard from us regarding the results in 2 weeks, please contact this office.     Dizziness Dizziness is a common problem. It makes you feel unsteady or light-headed. You may feel like you are about to pass out (faint). Dizziness can lead to getting hurt if you stumble or fall. Dizziness can be caused by many things, including:  Medicines.  Not having enough water in your body (dehydration).  Illness. Follow these instructions at home: Eating and drinking   Drink enough fluid to keep your pee (urine) clear or pale yellow. This helps to keep you from getting dehydrated. Try to drink more clear fluids, such as water.  Do not drink alcohol.  Limit how much caffeine you drink or eat, if your doctor tells you to do that.  Limit how much salt (sodium) you drink or eat, if your doctor tells you to do that. Activity   Avoid making quick movements. ? When you stand up from sitting in a chair, steady yourself until you feel okay. ? In the morning, first sit up on the side of the bed. When  you feel okay, stand slowly while you hold onto something. Do this until you know that your balance is fine.  If you need to stand in one place for a long time, move your legs often. Tighten and relax the muscles in your legs while you are standing.  Do not drive or use heavy machinery if you feel dizzy.  Avoid bending down if you feel dizzy. Place items in your home so you can reach them easily without leaning over. Lifestyle  Do not use any products that contain nicotine or tobacco, such as cigarettes and e-cigarettes. If you need help quitting, ask your doctor.  Try to lower your stress level. You can do this by using methods such as yoga or meditation. Talk with your doctor if you need help. General instructions  Watch your dizziness for any changes.  Take over-the-counter and prescription medicines only as told by your doctor. Talk with your doctor if you think that you are dizzy because of a medicine that you are taking.  Tell a friend or a family member that you are feeling dizzy. If he or she notices any changes in your behavior, have this person call your doctor.  Keep all follow-up visits as told by your doctor. This is important. Contact a doctor if:  Your dizziness does not go away.  Your dizziness or light-headedness gets worse.  You feel sick to your stomach (nauseous).  You   have trouble hearing.  You have new symptoms.  You are unsteady on your feet.  You feel like the room is spinning. Get help right away if:  You throw up (vomit) or have watery poop (diarrhea), and you cannot eat or drink anything.  You have trouble: ? Talking. ? Walking. ? Swallowing. ? Using your arms, hands, or legs.  You feel generally weak.  You are not thinking clearly, or you have trouble forming sentences. A friend or family member may notice this.  You have: ? Chest pain. ? Pain in your belly (abdomen). ? Shortness of breath. ? Sweating.  Your vision changes.  You  are bleeding.  You have a very bad headache.  You have neck pain or a stiff neck.  You have a fever. These symptoms may be an emergency. Do not wait to see if the symptoms will go away. Get medical help right away. Call your local emergency services (911 in the [place] Do not drive yourself to the hospital. Summary  Dizziness makes you feel unsteady or light-headed. You may feel like you are about to pass out (faint).  Drink enough fluid to keep your pee (urine) clear or pale yellow. Do not drink alcohol.  Avoid making quick movements if you feel dizzy.  Watch your dizziness for any changes. This information is not intended to replace advice given to you by your health care provider. Make sure you discuss any questions you have with your health care provider. Document Revised: 04/11/2017 Document Reviewed: 04/25/2016 Elsevier Patient Education  2020 Elsevier Inc.  

## 2019-05-11 ENCOUNTER — Encounter: Payer: Self-pay | Admitting: Emergency Medicine

## 2019-05-11 LAB — COMPREHENSIVE METABOLIC PANEL
ALT: 16 IU/L (ref 0–32)
AST: 18 IU/L (ref 0–40)
Albumin/Globulin Ratio: 1.5 (ref 1.2–2.2)
Albumin: 4.8 g/dL (ref 3.8–4.8)
Alkaline Phosphatase: 66 IU/L (ref 39–117)
BUN/Creatinine Ratio: 14 (ref 9–23)
BUN: 9 mg/dL (ref 6–24)
Bilirubin Total: 0.2 mg/dL (ref 0.0–1.2)
CO2: 23 mmol/L (ref 20–29)
Calcium: 9.8 mg/dL (ref 8.7–10.2)
Chloride: 97 mmol/L (ref 96–106)
Creatinine, Ser: 0.64 mg/dL (ref 0.57–1.00)
GFR calc Af Amer: 126 mL/min/{1.73_m2} (ref >59)
GFR calc non Af Amer: 110 mL/min/{1.73_m2} (ref >59)
Globulin, Total: 3.1 g/dL (ref 1.5–4.5)
Glucose: 122 mg/dL — ABNORMAL HIGH (ref 65–99)
Potassium: 3.6 mmol/L (ref 3.5–5.2)
Sodium: 135 mmol/L (ref 134–144)
Total Protein: 7.9 g/dL (ref 6.0–8.5)

## 2019-05-11 LAB — LIPID PANEL
Chol/HDL Ratio: 3.9 ratio (ref 0.0–4.4)
Cholesterol, Total: 231 mg/dL — ABNORMAL HIGH (ref 100–199)
HDL: 59 mg/dL (ref >39)
LDL Chol Calc (NIH): 147 mg/dL — ABNORMAL HIGH (ref 0–99)
Triglycerides: 138 mg/dL (ref 0–149)
VLDL Cholesterol Cal: 25 mg/dL (ref 5–40)

## 2019-05-11 LAB — THYROID PANEL WITH TSH
Free Thyroxine Index: 1.4 (ref 1.2–4.9)
T3 Uptake Ratio: 22 % — ABNORMAL LOW (ref 24–39)
T4, Total: 6.5 ug/dL (ref 4.5–12.0)
TSH: 6.78 u[IU]/mL — ABNORMAL HIGH (ref 0.450–4.500)

## 2019-06-07 ENCOUNTER — Ambulatory Visit (INDEPENDENT_AMBULATORY_CARE_PROVIDER_SITE_OTHER): Payer: 59 | Admitting: Emergency Medicine

## 2019-06-07 ENCOUNTER — Other Ambulatory Visit: Payer: Self-pay

## 2019-06-07 ENCOUNTER — Encounter: Payer: Self-pay | Admitting: Emergency Medicine

## 2019-06-07 VITALS — BP 123/77 | HR 90 | Temp 98.4°F | Ht 64.0 in | Wt 193.4 lb

## 2019-06-07 DIAGNOSIS — I10 Essential (primary) hypertension: Secondary | ICD-10-CM

## 2019-06-07 DIAGNOSIS — E785 Hyperlipidemia, unspecified: Secondary | ICD-10-CM | POA: Diagnosis not present

## 2019-06-07 NOTE — Progress Notes (Signed)
Ann Thornton 44 y.o.   Chief Complaint  Patient presents with  . Med review    follow up on heart med     HISTORY OF PRESENT ILLNESS: This is a 44 y.o. female here for follow-up from visit 05/10/2019 when I saw her with dizziness and high blood pressure. At the time she was drinking more than usual.  Started on atenolol 50 mg daily. Feels much better.  Has no complaints or medical concerns today.  HPI   Prior to Admission medications   Medication Sig Start Date End Date Taking? Authorizing Provider  amLODipine (NORVASC) 10 MG tablet TAKE 1 TABLET BY MOUTH  DAILY 01/04/19  Yes Oneda Duffett, Ines Bloomer, MD  atenolol (TENORMIN) 50 MG tablet Take 1 tablet (50 mg total) by mouth daily. 05/10/19  Yes Rheannon Cerney, Ines Bloomer, MD  ezetimibe (ZETIA) 10 MG tablet Take 1 tablet (10 mg total) by mouth daily. 02/09/19  Yes Horald Pollen, MD  irbesartan (AVAPRO) 150 MG tablet TAKE 1 TABLET BY MOUTH  DAILY 01/04/19  Yes Horald Pollen, MD    No Known Allergies  Patient Active Problem List   Diagnosis Date Noted  . Mixed hyperlipidemia 07/07/2018  . Essential hypertension 03/23/2015  . Anxiety disorder 06/02/2014    Past Medical History:  Diagnosis Date  . Allergy   . Anxiety   . High blood pressure     Past Surgical History:  Procedure Laterality Date  . EYE SURGERY Right 2009   Tightened muscle of eye    Social History   Socioeconomic History  . Marital status: Married    Spouse name: Not on file  . Number of children: Not on file  . Years of education: Not on file  . Highest education level: Not on file  Occupational History  . Occupation: Research scientist (physical sciences): [employer]  Tobacco Use  . Smoking status: Never Smoker  . Smokeless tobacco: Never Used  Substance and Sexual Activity  . Alcohol use: Yes    Alcohol/week: 14.0 standard drinks    Types: 14 Cans of beer per week    Comment: 3 beers a day  . Drug use: No  . Sexual activity: Yes      Partners: Male    Birth control/protection: None  Other Topics Concern  . Not on file  Social History Narrative   Single without children   Working from home   Exercise - zumba once a week, fitness workout on Mondays   Diet - trying to be good, eating no salt.          As of 11/08/15:      Diet: N/A   Caffeine: No Sodas   Married: Engaged   House: Lives in apartment, 2 stories, 1 person    Pets: None   Current/Past profession: Data Entry    Exercise: No    Living Will: No    DNR: No    POA/HPOA: No       Social Determinants of Radio broadcast assistant Strain:   . Difficulty of Paying Living Expenses: Not on file  Food Insecurity:   . Worried About Charity fundraiser in the Last Year: Not on file  . Ran Out of Food in the Last Year: Not on file  Transportation Needs:   . Lack of Transportation (Medical): Not on file  . Lack of Transportation (Non-Medical): Not on file  Physical Activity:   . Days of Exercise  per Week: Not on file  . Minutes of Exercise per Session: Not on file  Stress:   . Feeling of Stress : Not on file  Social Connections:   . Frequency of Communication with Friends and Family: Not on file  . Frequency of Social Gatherings with Friends and Family: Not on file  . Attends Religious Services: Not on file  . Active Member of Clubs or Organizations: Not on file  . Attends Archivist Meetings: Not on file  . Marital Status: Not on file  Intimate Partner Violence:   . Fear of Current or Ex-Partner: Not on file  . Emotionally Abused: Not on file  . Physically Abused: Not on file  . Sexually Abused: Not on file    Family History  Problem Relation Age of Onset  . Hypertension Mother   . Stroke Mother   . Thyroid disease Sister      Review of Systems  Constitutional: Negative.  Negative for chills and fever.  HENT: Negative.  Negative for congestion and sore throat.   Respiratory: Negative.  Negative for cough and shortness of  breath.   Cardiovascular: Negative.  Negative for chest pain and palpitations.  Gastrointestinal: Negative.  Negative for abdominal pain, diarrhea, nausea and vomiting.  Genitourinary: Negative.  Negative for dysuria and hematuria.  Musculoskeletal: Negative.  Negative for back pain, myalgias and neck pain.  Skin: Negative.  Negative for rash.  Neurological: Negative.  Negative for dizziness and headaches.  All other systems reviewed and are negative.   Vitals:   06/07/19 1128  BP: 123/77  Pulse: 90  Temp: 98.4 F (36.9 C)  SpO2: 100%    Physical Exam Vitals reviewed.  HENT:     Head: Normocephalic.  Eyes:     Extraocular Movements: Extraocular movements intact.     Conjunctiva/sclera: Conjunctivae normal.     Pupils: Pupils are equal, round, and reactive to light.  Cardiovascular:     Rate and Rhythm: Normal rate and regular rhythm.  Pulmonary:     Effort: Pulmonary effort is normal.     Breath sounds: Normal breath sounds.  Musculoskeletal:        General: Normal range of motion.     Cervical back: Normal range of motion and neck supple.  Skin:    General: Skin is warm and dry.     Capillary Refill: Capillary refill takes less than 2 seconds.  Neurological:     General: No focal deficit present.     Mental Status: She is alert and oriented to person, place, and time.  Psychiatric:        Mood and Affect: Mood normal.        Behavior: Behavior normal.      ASSESSMENT & PLAN: Clinically stable and much improved.  No medical concerns identified during this visit.  Ann Thornton was seen today for med review.  Diagnoses and all orders for this visit:  Essential hypertension  Dyslipidemia    Patient Instructions       If you have lab work done today you will be contacted with your lab results within the next 2 weeks.  If you have not heard from [place] then please contact us. The fastest way to get your results is to register for My Chart.   IF you received an  x-ray today, you will receive an invoice from [facility]. Please contact [facility] at [phone] with questions or concerns regarding your invoice.   IF you received labwork  today, you will receive an invoice from [facility]. Please contact [facility] at [phone] with questions or concerns regarding your invoice.   Our billing staff will not be able to assist you with questions regarding bills from these companies.  You will be contacted with the lab results as soon as they are available. The fastest way to get your results is to activate your My Chart account. Instructions are located on the last page of this paperwork. If you have not heard from [place] regarding the results in 2 weeks, please contact this office.      Health Maintenance, Female Adopting a healthy lifestyle and getting preventive care are important in promoting health and wellness. Ask your health care provider about:  The right schedule for you to have regular tests and exams.  Things you can do on your own to prevent diseases and keep yourself healthy. What should I know about diet, weight, and exercise? Eat a healthy diet   Eat a diet that includes plenty of vegetables, fruits, low-fat dairy products, and lean protein.  Do not eat a lot of foods that are high in solid fats, added sugars, or sodium. Maintain a healthy weight Body mass index (BMI) is used to identify weight problems. It estimates body fat based on height and weight. Your health care provider can help determine your BMI and help you achieve or maintain a healthy weight. Get regular exercise Get regular exercise. This is one of the most important things you can do for your health. Most adults should:  Exercise for at least 150 minutes each week. The exercise should increase your heart rate and make you sweat (moderate-intensity exercise).  Do strengthening exercises at least twice a week. This is in addition to the moderate-intensity  exercise.  Spend less time sitting. Even light physical activity can be beneficial. Watch cholesterol and blood lipids Have your blood tested for lipids and cholesterol at 44 years of age, then have this test every 5 years. Have your cholesterol levels checked more often if:  Your lipid or cholesterol levels are high.  You are older than 44 years of age.  You are at high risk for heart disease. What should I know about cancer screening? Depending on your health history and family history, you may need to have cancer screening at various ages. This may include screening for:  Breast cancer.  Cervical cancer.  Colorectal cancer.  Skin cancer.  Lung cancer. What should I know about heart disease, diabetes, and high blood pressure? Blood pressure and heart disease  High blood pressure causes heart disease and increases the risk of stroke. This is more likely to develop in people who have high blood pressure readings, are of African descent, or are overweight.  Have your blood pressure checked: ? Every 3-5 years if you are 75-58 years of age. ? Every year if you are 62 years old or older. Diabetes Have regular diabetes screenings. This checks your fasting blood sugar level. Have the screening done:  Once every three years after age 94 if you are at a normal weight and have a low risk for diabetes.  More often and at a younger age if you are overweight or have a high risk for diabetes. What should I know about preventing infection? Hepatitis B If you have a higher risk for hepatitis B, you should be screened for this virus. Talk with your health care provider to find out if you are at risk for hepatitis B infection. Hepatitis C  Testing is recommended for:  Everyone born from [birth year] through 1965.  Anyone with known risk factors for hepatitis C. Sexually transmitted infections (STIs)  Get screened for STIs, including gonorrhea and chlamydia, if: ? You are sexually active and  are younger than 44 years of age. ? You are older than 44 years of age and your health care provider tells you that you are at risk for this type of infection. ? Your sexual activity has changed since you were last screened, and you are at increased risk for chlamydia or gonorrhea. Ask your health care provider if you are at risk.  Ask your health care provider about whether you are at high risk for HIV. Your health care provider may recommend a prescription medicine to help prevent HIV infection. If you choose to take medicine to prevent HIV, you should first get tested for HIV. You should then be tested every 3 months for as long as you are taking the medicine. Pregnancy  If you are about to stop having your period (premenopausal) and you may become pregnant, seek counseling before you get pregnant.  Take 400 to 800 micrograms (mcg) of folic acid every day if you become pregnant.  Ask for birth control (contraception) if you want to prevent pregnancy. Osteoporosis and menopause Osteoporosis is a disease in which the bones lose minerals and strength with aging. This can result in bone fractures. If you are 27 years old or older, or if you are at risk for osteoporosis and fractures, ask your health care provider if you should:  Be screened for bone loss.  Take a calcium or vitamin D supplement to lower your risk of fractures.  Be given hormone replacement therapy (HRT) to treat symptoms of menopause. Follow these instructions at home: Lifestyle  Do not use any products that contain nicotine or tobacco, such as cigarettes, e-cigarettes, and chewing tobacco. If you need help quitting, ask your health care provider.  Do not use street drugs.  Do not share needles.  Ask your health care provider for help if you need support or information about quitting drugs. Alcohol use  Do not drink alcohol if: ? Your health care provider tells you not to drink. ? You are pregnant, may be pregnant,  or are planning to become pregnant.  If you drink alcohol: ? Limit how much you use to 0-1 drink a day. ? Limit intake if you are breastfeeding.  Be aware of how much alcohol is in your drink. In the [place] one drink equals one 12 oz bottle of beer (355 mL), one 5 oz glass of wine (148 mL), or one 1 oz glass of hard liquor (44 mL). General instructions  Schedule regular health, dental, and eye exams.  Stay current with your vaccines.  Tell your health care provider if: ? You often feel depressed. ? You have ever been abused or do not feel safe at home. Summary  Adopting a healthy lifestyle and getting preventive care are important in promoting health and wellness.  Follow your health care provider's instructions about healthy diet, exercising, and getting tested or screened for diseases.  Follow your health care provider's instructions on monitoring your cholesterol and blood pressure. This information is not intended to replace advice given to you by your health care provider. Make sure you discuss any questions you have with your health care provider. Document Revised: 04/01/2018 Document Reviewed: 04/01/2018 Elsevier Patient Education  2020 Elsevier Inc.      Agustina Caroli, MD [facility]

## 2019-06-07 NOTE — Patient Instructions (Addendum)
   If you have lab work done today you will be contacted with your lab results within the next 2 weeks.  If you have not heard from us then please contact us. The fastest way to get your results is to register for My Chart.   IF you received an x-ray today, you will receive an invoice from [facility]. Please contact [facility] at [phone] with questions or concerns regarding your invoice.   IF you received labwork today, you will receive an invoice from [facility]. Please contact [facility] at [phone] with questions or concerns regarding your invoice.   Our billing staff will not be able to assist you with questions regarding bills from these companies.  You will be contacted with the lab results as soon as they are available. The fastest way to get your results is to activate your My Chart account. Instructions are located on the last page of this paperwork. If you have not heard from us regarding the results in 2 weeks, please contact this office.       Health Maintenance, Female Adopting a healthy lifestyle and getting preventive care are important in promoting health and wellness. Ask your health care provider about:  The right schedule for you to have regular tests and exams.  Things you can do on your own to prevent diseases and keep yourself healthy. What should I know about diet, weight, and exercise? Eat a healthy diet   Eat a diet that includes plenty of vegetables, fruits, low-fat dairy products, and lean protein.  Do not eat a lot of foods that are high in solid fats, added sugars, or sodium. Maintain a healthy weight Body mass index (BMI) is used to identify weight problems. It estimates body fat based on height and weight. Your health care provider can help determine your BMI and help you achieve or maintain a healthy weight. Get regular exercise Get regular exercise. This is one of the most important things you can do for your health. Most  adults should:  Exercise for at least 150 minutes each week. The exercise should increase your heart rate and make you sweat (moderate-intensity exercise).  Do strengthening exercises at least twice a week. This is in addition to the moderate-intensity exercise.  Spend less time sitting. Even light physical activity can be beneficial. Watch cholesterol and blood lipids Have your blood tested for lipids and cholesterol at 44 years of age, then have this test every 5 years. Have your cholesterol levels checked more often if:  Your lipid or cholesterol levels are high.  You are older than 44 years of age.  You are at high risk for heart disease. What should I know about cancer screening? Depending on your health history and family history, you may need to have cancer screening at various ages. This may include screening for:  Breast cancer.  Cervical cancer.  Colorectal cancer.  Skin cancer.  Lung cancer. What should I know about heart disease, diabetes, and high blood pressure? Blood pressure and heart disease  High blood pressure causes heart disease and increases the risk of stroke. This is more likely to develop in people who have high blood pressure readings, are of African descent, or are overweight.  Have your blood pressure checked: ? Every 3-5 years if you are 18-39 years of age. ? Every year if you are 40 years old or older. Diabetes Have regular diabetes screenings. This checks your fasting blood sugar level. Have the screening done:  Once   every three years after age 40 if you are at a normal weight and have a low risk for diabetes.  More often and at a younger age if you are overweight or have a high risk for diabetes. What should I know about preventing infection? Hepatitis B If you have a higher risk for hepatitis B, you should be screened for this virus. Talk with your health care provider to find out if you are at risk for hepatitis B infection. Hepatitis  C Testing is recommended for:  Everyone born from 1945 through 1965.  Anyone with known risk factors for hepatitis C. Sexually transmitted infections (STIs)  Get screened for STIs, including gonorrhea and chlamydia, if: ? You are sexually active and are younger than 44 years of age. ? You are older than 44 years of age and your health care provider tells you that you are at risk for this type of infection. ? Your sexual activity has changed since you were last screened, and you are at increased risk for chlamydia or gonorrhea. Ask your health care provider if you are at risk.  Ask your health care provider about whether you are at high risk for HIV. Your health care provider may recommend a prescription medicine to help prevent HIV infection. If you choose to take medicine to prevent HIV, you should first get tested for HIV. You should then be tested every 3 months for as long as you are taking the medicine. Pregnancy  If you are about to stop having your period (premenopausal) and you may become pregnant, seek counseling before you get pregnant.  Take 400 to 800 micrograms (mcg) of folic acid every day if you become pregnant.  Ask for birth control (contraception) if you want to prevent pregnancy. Osteoporosis and menopause Osteoporosis is a disease in which the bones lose minerals and strength with aging. This can result in bone fractures. If you are 65 years old or older, or if you are at risk for osteoporosis and fractures, ask your health care provider if you should:  Be screened for bone loss.  Take a calcium or vitamin D supplement to lower your risk of fractures.  Be given hormone replacement therapy (HRT) to treat symptoms of menopause. Follow these instructions at home: Lifestyle  Do not use any products that contain nicotine or tobacco, such as cigarettes, e-cigarettes, and chewing tobacco. If you need help quitting, ask your health care provider.  Do not use street  drugs.  Do not share needles.  Ask your health care provider for help if you need support or information about quitting drugs. Alcohol use  Do not drink alcohol if: ? Your health care provider tells you not to drink. ? You are pregnant, may be pregnant, or are planning to become pregnant.  If you drink alcohol: ? Limit how much you use to 0-1 drink a day. ? Limit intake if you are breastfeeding.  Be aware of how much alcohol is in your drink. In the [place] one drink equals one 12 oz bottle of beer (355 mL), one 5 oz glass of wine (148 mL), or one 1 oz glass of hard liquor (44 mL). General instructions  Schedule regular health, dental, and eye exams.  Stay current with your vaccines.  Tell your health care provider if: ? You often feel depressed. ? You have ever been abused or do not feel safe at home. Summary  Adopting a healthy lifestyle and getting preventive care are important in promoting health and   wellness.  Follow your health care provider's instructions about healthy diet, exercising, and getting tested or screened for diseases.  Follow your health care provider's instructions on monitoring your cholesterol and blood pressure. This information is not intended to replace advice given to you by your health care provider. Make sure you discuss any questions you have with your health care provider. Document Revised: 04/01/2018 Document Reviewed: 04/01/2018 Elsevier Patient Education  2020 Elsevier Inc.  

## 2019-07-06 ENCOUNTER — Ambulatory Visit: Payer: 59 | Admitting: Emergency Medicine

## 2019-11-30 ENCOUNTER — Other Ambulatory Visit: Payer: Self-pay | Admitting: Emergency Medicine

## 2019-11-30 DIAGNOSIS — E785 Hyperlipidemia, unspecified: Secondary | ICD-10-CM

## 2019-11-30 NOTE — Telephone Encounter (Signed)
Requested Prescriptions  Pending Prescriptions Disp Refills   amLODipine (NORVASC) 10 MG tablet [Pharmacy Med Name: amLODIPine Besylate 10 MG Oral Tablet] 90 tablet 3    Sig: TAKE 1 TABLET BY MOUTH  DAILY     Cardiovascular:  Calcium Channel Blockers Passed - 11/30/2019  9:24 PM      Passed - Last BP in normal range    BP Readings from Last 1 Encounters:  06/07/19 123/77         Passed - Valid encounter within last 6 months    Recent Outpatient Visits          5 months ago Essential hypertension   [facility] [place], [place], [place]   6 months ago Dizziness   [facility] [place], [place], MD   10 months ago Essential hypertension   [facility] [place], [place], MD   1 year ago Routine general medical examination at a health care facility   [place] [place], Ines Bloomer, MD   1 year ago Essential hypertension   [facility] at [place], Ines Bloomer, MD      Future Appointments            In 1 week [place], Ines Bloomer, MD [facility] at [place], [facility]            ezetimibe (ZETIA) 10 MG tablet [Pharmacy Med Name: EZETIMIBE  10MG   TAB] 90 tablet 3    Sig: TAKE 1 TABLET BY MOUTH  DAILY     Cardiovascular:  Antilipid - Sterol Transport Inhibitors Failed - 11/30/2019  9:24 PM      Failed - Total Cholesterol in normal range and within 360 days    Cholesterol, Total  Date Value Ref Range Status  05/10/2019 231 (H) 100 - 199 mg/dL Final         Failed - LDL in normal range and within 360 days    LDL Chol Calc (NIH)  Date Value Ref Range Status  05/10/2019 147 (H) 0 - 99 mg/dL Final         Passed - HDL in normal range and within 360 days    HDL  Date Value Ref Range Status  05/10/2019 59 >39 mg/dL Final         Passed - Triglycerides in normal range and within 360 days    Triglycerides  Date Value Ref Range Status  05/10/2019 138 0 - 149 mg/dL Final         Passed - Valid encounter within last 12  months    Recent Outpatient Visits          5 months ago Essential hypertension   [facility] [place], Ines Bloomer, MD   6 months ago Dizziness   [facility] [place], Ines Bloomer, MD   10 months ago Essential hypertension   [facility] [place], Ines Bloomer, MD   1 year ago Routine general medical examination at a health care facility   [place] [facility] [place], Ines Bloomer, MD   1 year ago Essential hypertension   [facility] at [facility], Ines Bloomer, MD      Future Appointments            In 1 week [place], Ines Bloomer, MD [facility] at [place], [place]            irbesartan (AVAPRO) 150 MG tablet [Pharmacy Med Name: IRBESARTAN  150MG   TAB] 90 tablet 3    Sig: TAKE 1 TABLET BY MOUTH  DAILY     Cardiovascular:  Angiotensin Receptor Blockers Failed - 11/30/2019  9:24 PM      Failed - Cr in normal range and within 180 days    Creat  Date Value Ref Range Status  12/16/2015 0.60 0.50 - 1.10 mg/dL Final   Creatinine, Ser  Date Value Ref Range Status  05/10/2019 0.64 0.57 - 1.00 mg/dL Final         Failed - K in normal range and within 180 days    Potassium  Date Value Ref Range Status  05/10/2019 3.6 3.5 - 5.2 mmol/L Final         Passed - Patient is not pregnant      Passed - Last BP in normal range    BP Readings from Last 1 Encounters:  06/07/19 123/77         Passed - Valid encounter within last 6 months    Recent Outpatient Visits          5 months ago Essential hypertension   [facility] [place], Ines Bloomer, MD   6 months ago Dizziness   [facility] at [place], Ines Bloomer, MD   10 months ago Essential hypertension   [facility] at [place], Ines Bloomer, MD   1 year ago Routine general medical examination at a health care facility   [place] [facility], Ines Bloomer, MD   1 year ago Essential hypertension   [facility] at [facility], Ines Bloomer, [place]            In 1 week [place], Ines Bloomer, MD [facility] at [place], [facility]

## 2019-12-06 ENCOUNTER — Ambulatory Visit: Payer: 59 | Admitting: Emergency Medicine

## 2019-12-08 ENCOUNTER — Other Ambulatory Visit: Payer: Self-pay

## 2019-12-08 ENCOUNTER — Encounter: Payer: Self-pay | Admitting: Emergency Medicine

## 2019-12-08 ENCOUNTER — Ambulatory Visit (INDEPENDENT_AMBULATORY_CARE_PROVIDER_SITE_OTHER): Payer: 59 | Admitting: Emergency Medicine

## 2019-12-08 VITALS — BP 138/77 | HR 101 | Temp 98.4°F | Ht 64.0 in | Wt 195.4 lb

## 2019-12-08 DIAGNOSIS — R7303 Prediabetes: Secondary | ICD-10-CM

## 2019-12-08 DIAGNOSIS — R7989 Other specified abnormal findings of blood chemistry: Secondary | ICD-10-CM | POA: Diagnosis not present

## 2019-12-08 DIAGNOSIS — E785 Hyperlipidemia, unspecified: Secondary | ICD-10-CM

## 2019-12-08 DIAGNOSIS — I10 Essential (primary) hypertension: Secondary | ICD-10-CM

## 2019-12-08 DIAGNOSIS — Z6833 Body mass index (BMI) 33.0-33.9, adult: Secondary | ICD-10-CM

## 2019-12-08 NOTE — Assessment & Plan Note (Signed)
Hemoglobin A1c done today.

## 2019-12-08 NOTE — Assessment & Plan Note (Signed)
Well-controlled hypertension.  Continue present medications.

## 2019-12-08 NOTE — Progress Notes (Signed)
Ann Thornton 44 y.o.   Chief Complaint  Patient presents with  . Hypertension    f/u     HISTORY OF PRESENT ILLNESS: This is a 44 y.o. female with history of hypertension and dyslipidemia here for follow-up. Doing well.  Has no complaints or medical concerns today. Skin rash with appointment to see dermatology September 1st. Fully vaccinated against Covid. Blood work done 6 months ago showed prediabetes, abnormal lipid profile, and increased TSH level. We will repeat today.  HPI   Prior to Admission medications   Medication Sig Start Date End Date Taking? Authorizing Provider  amLODipine (NORVASC) 10 MG tablet TAKE 1 TABLET BY MOUTH  DAILY 11/30/19  Yes Nichelle Renwick, Ines Bloomer, MD  atenolol (TENORMIN) 50 MG tablet Take 1 tablet (50 mg total) by mouth daily. 05/10/19  Yes Treva Huyett, Ines Bloomer, MD  ezetimibe (ZETIA) 10 MG tablet TAKE 1 TABLET BY MOUTH  DAILY 11/30/19  Yes Horald Pollen, MD  irbesartan (AVAPRO) 150 MG tablet TAKE 1 TABLET BY MOUTH  DAILY 11/30/19  Yes Horald Pollen, MD    No Known Allergies  Patient Active Problem List   Diagnosis Date Noted  . Mixed hyperlipidemia 07/07/2018  . Essential hypertension 03/23/2015  . Anxiety disorder 06/02/2014    Past Medical History:  Diagnosis Date  . Allergy   . Anxiety   . High blood pressure     Past Surgical History:  Procedure Laterality Date  . EYE SURGERY Right 2009   Tightened muscle of eye    Social History   Socioeconomic History  . Marital status: Married    Spouse name: Not on file  . Number of children: Not on file  . Years of education: Not on file  . Highest education level: Not on file  Occupational History  . Occupation: Research scientist (physical sciences): [employer]  Tobacco Use  . Smoking status: Never Smoker  . Smokeless tobacco: Never Used  Vaping Use  . Vaping Use: Never used  Substance and Sexual Activity  . Alcohol use: Yes    Alcohol/week: 14.0 standard  drinks    Types: 14 Cans of beer per week    Comment: 3 beers a day  . Drug use: No  . Sexual activity: Yes    Partners: Male    Birth control/protection: None  Other Topics Concern  . Not on file  Social History Narrative   Single without children   Working from home   Exercise - zumba once a week, fitness workout on Mondays   Diet - trying to be good, eating no salt.          As of 11/08/15:      Diet: N/A   Caffeine: No Sodas   Married: Engaged   House: Lives in apartment, 2 stories, 1 person    Pets: None   Current/Past profession: Data Entry    Exercise: No    Living Will: No    DNR: No    POA/HPOA: No       Social Determinants of Radio broadcast assistant Strain:   . Difficulty of Paying Living Expenses:   Food Insecurity:   . Worried About Charity fundraiser in the Last Year:   . Arboriculturist in the Last Year:   Transportation Needs:   . Film/video editor (Medical):   Marland Kitchen Lack of Transportation (Non-Medical):   Physical Activity:   . Days of Exercise per  Week:   . Minutes of Exercise per Session:   Stress:   . Feeling of Stress :   Social Connections:   . Frequency of Communication with Friends and Family:   . Frequency of Social Gatherings with Friends and Family:   . Attends Religious Services:   . Active Member of Clubs or Organizations:   . Attends Archivist Meetings:   Marland Kitchen Marital Status:   Intimate Partner Violence:   . Fear of Current or Ex-Partner:   . Emotionally Abused:   Marland Kitchen Physically Abused:   . Sexually Abused:     Family History  Problem Relation Age of Onset  . Hypertension Mother   . Stroke Mother   . Thyroid disease Sister      Review of Systems  Constitutional: Negative.  Negative for chills and fever.  HENT: Negative.  Negative for congestion and sore throat.   Respiratory: Negative.  Negative for cough and shortness of breath.   Cardiovascular: Negative.  Negative for chest pain and palpitations.    Gastrointestinal: Negative.  Negative for abdominal pain, diarrhea, nausea and vomiting.  Genitourinary: Negative.  Negative for dysuria and hematuria.  Musculoskeletal: Negative.  Negative for back pain, myalgias and neck pain.  Skin: Positive for itching and rash.  Neurological: Negative.  Negative for dizziness and headaches.  All other systems reviewed and are negative.  Today's Vitals   12/08/19 1601  BP: 138/77  Pulse: (!) 101  Temp: 98.4 F (36.9 C)  TempSrc: Temporal  SpO2: 96%  Weight: 195 lb 6.4 oz (88.6 kg)  Height: 5\' 4"  (1.626 m)   Body mass index is 33.54 kg/m.   Physical Exam Vitals reviewed.  Constitutional:      Appearance: Normal appearance.  HENT:     Head: Normocephalic.  Eyes:     Extraocular Movements: Extraocular movements intact.     Conjunctiva/sclera: Conjunctivae normal.     Pupils: Pupils are equal, round, and reactive to light.  Neck:     Vascular: No carotid bruit.  Cardiovascular:     Rate and Rhythm: Normal rate and regular rhythm.     Pulses: Normal pulses.     Heart sounds: Normal heart sounds.  Pulmonary:     Effort: Pulmonary effort is normal.     Breath sounds: Normal breath sounds.  Abdominal:     General: Bowel sounds are normal. There is no distension.     Palpations: Abdomen is soft.     Tenderness: There is no abdominal tenderness.  Musculoskeletal:        General: Normal range of motion.     Cervical back: Normal range of motion and neck supple. No tenderness.  Lymphadenopathy:     Cervical: No cervical adenopathy.  Skin:    General: Skin is warm and dry.     Capillary Refill: Capillary refill takes less than 2 seconds.     Findings: Rash present.  Neurological:     General: No focal deficit present.     Mental Status: She is alert and oriented to person, place, and time.  Psychiatric:        Mood and Affect: Mood normal.        Behavior: Behavior normal.      ASSESSMENT & PLAN: Essential  hypertension Well-controlled hypertension.  Continue present medications.   Dyslipidemia Lipid profile done today.  Continue Zetia 10 mg daily.  Prediabetes Hemoglobin A1c done today.  Abnormal thyroid blood test Elevated TSH 6 months ago.  Repeated today.  Dereona was seen today for hypertension.  Diagnoses and all orders for this visit:  Essential hypertension -     Comprehensive metabolic panel  Dyslipidemia -     Lipid panel  Prediabetes -     Hemoglobin A1c  Abnormal thyroid blood test -     TSH  Body mass index (BMI) of 33.0-33.9 in adult    Patient Instructions       If you have lab work done today you will be contacted with your lab results within the next 2 weeks.  If you have not heard from [place] then please contact us. The fastest way to get your results is to register for My Chart.   IF you received an x-ray today, you will receive an invoice from [facility]. Please contact [facility] at [phone] with questions or concerns regarding your invoice.   IF you received labwork today, you will receive an invoice from [facility]. Please contact [facility] at [phone] with questions or concerns regarding your invoice.   Our billing staff will not be able to assist you with questions regarding bills from these companies.  You will be contacted with the lab results as soon as they are available. The fastest way to get your results is to activate your My Chart account. Instructions are located on the last page of this paperwork. If you have not heard from [place] regarding the results in 2 weeks, please contact this office.      Hypertension, Adult High blood pressure (hypertension) is when the force of blood pumping through the arteries is too strong. The arteries are the blood vessels that carry blood from the heart throughout the body. Hypertension forces the heart to work harder to pump blood and may cause arteries to become narrow or stiff.  Untreated or uncontrolled hypertension can cause a heart attack, heart failure, a stroke, kidney disease, and other problems. A blood pressure reading consists of a higher number over a lower number. Ideally, your blood pressure should be below 120/80. The first ("top") number is called the systolic pressure. It is a measure of the pressure in your arteries as your heart beats. The second ("bottom") number is called the diastolic pressure. It is a measure of the pressure in your arteries as the heart relaxes. What are the causes? The exact cause of this condition is not known. There are some conditions that result in or are related to high blood pressure. What increases the risk? Some risk factors for high blood pressure are under your control. The following factors may make you more likely to develop this condition:  Smoking.  Having type 2 diabetes mellitus, high cholesterol, or both.  Not getting enough exercise or physical activity.  Being overweight.  Having too much fat, sugar, calories, or salt (sodium) in your diet.  Drinking too much alcohol. Some risk factors for high blood pressure may be difficult or impossible to change. Some of these factors include:  Having chronic kidney disease.  Having a family history of high blood pressure.  Age. Risk increases with age.  Race. You may be at higher risk if you are African American.  Gender. Men are at higher risk than women before age 72. After age 40, women are at higher risk than men.  Having obstructive sleep apnea.  Stress. What are the signs or symptoms? High blood pressure may not cause symptoms. Very high blood pressure (hypertensive crisis) may cause:  Headache.  Anxiety.  Shortness of  breath.  Nosebleed.  Nausea and vomiting.  Vision changes.  Severe chest pain.  Seizures. How is this diagnosed? This condition is diagnosed by measuring your blood pressure while you are seated, with your arm resting on a  flat surface, your legs uncrossed, and your feet flat on the floor. The cuff of the blood pressure monitor will be placed directly against the skin of your upper arm at the level of your heart. It should be measured at least twice using the same arm. Certain conditions can cause a difference in blood pressure between your right and left arms. Certain factors can cause blood pressure readings to be lower or higher than normal for a short period of time:  When your blood pressure is higher when you are in a health care provider's office than when you are at home, this is called white coat hypertension. Most people with this condition do not need medicines.  When your blood pressure is higher at home than when you are in a health care provider's office, this is called masked hypertension. Most people with this condition may need medicines to control blood pressure. If you have a high blood pressure reading during one visit or you have normal blood pressure with other risk factors, you may be asked to:  Return on a different day to have your blood pressure checked again.  Monitor your blood pressure at home for 1 week or longer. If you are diagnosed with hypertension, you may have other blood or imaging tests to help your health care provider understand your overall risk for other conditions. How is this treated? This condition is treated by making healthy lifestyle changes, such as eating healthy foods, exercising more, and reducing your alcohol intake. Your health care provider may prescribe medicine if lifestyle changes are not enough to get your blood pressure under control, and if:  Your systolic blood pressure is above 130.  Your diastolic blood pressure is above 80. Your personal target blood pressure may vary depending on your medical conditions, your age, and other factors. Follow these instructions at home: Eating and drinking   Eat a diet that is high in fiber and potassium, and low in  sodium, added sugar, and fat. An example eating plan is called the DASH (Dietary Approaches to Stop Hypertension) diet. To eat this way: ? Eat plenty of fresh fruits and vegetables. Try to fill one half of your plate at each meal with fruits and vegetables. ? Eat whole grains, such as whole-wheat pasta, brown rice, or whole-grain bread. Fill about one fourth of your plate with whole grains. ? Eat or drink low-fat dairy products, such as skim milk or low-fat yogurt. ? Avoid fatty cuts of meat, processed or cured meats, and poultry with skin. Fill about one fourth of your plate with lean proteins, such as fish, chicken without skin, beans, eggs, or tofu. ? Avoid pre-made and processed foods. These tend to be higher in sodium, added sugar, and fat.  Reduce your daily sodium intake. Most people with hypertension should eat less than 1,500 mg of sodium a day.  Do not drink alcohol if: ? Your health care provider tells you not to drink. ? You are pregnant, may be pregnant, or are planning to become pregnant.  If you drink alcohol: ? Limit how much you use to:  0-1 drink a day for women.  0-2 drinks a day for men. ? Be aware of how much alcohol is in your drink. In the [place]  one drink equals one 12 oz bottle of beer (355 mL), one 5 oz glass of wine (148 mL), or one 1 oz glass of hard liquor (44 mL). Lifestyle   Work with your health care provider to maintain a healthy body weight or to lose weight. Ask what an ideal weight is for you.  Get at least 30 minutes of exercise most days of the week. Activities may include walking, swimming, or biking.  Include exercise to strengthen your muscles (resistance exercise), such as Pilates or lifting weights, as part of your weekly exercise routine. Try to do these types of exercises for 30 minutes at least 3 days a week.  Do not use any products that contain nicotine or tobacco, such as cigarettes, e-cigarettes, and chewing tobacco. If you need help  quitting, ask your health care provider.  Monitor your blood pressure at home as told by your health care provider.  Keep all follow-up visits as told by your health care provider. This is important. Medicines  Take over-the-counter and prescription medicines only as told by your health care provider. Follow directions carefully. Blood pressure medicines must be taken as prescribed.  Do not skip doses of blood pressure medicine. Doing this puts you at risk for problems and can make the medicine less effective.  Ask your health care provider about side effects or reactions to medicines that you should watch for. Contact a health care provider if you:  Think you are having a reaction to a medicine you are taking.  Have headaches that keep coming back (recurring).  Feel dizzy.  Have swelling in your ankles.  Have trouble with your vision. Get help right away if you:  Develop a severe headache or confusion.  Have unusual weakness or numbness.  Feel faint.  Have severe pain in your chest or abdomen.  Vomit repeatedly.  Have trouble breathing. Summary  Hypertension is when the force of blood pumping through your arteries is too strong. If this condition is not controlled, it may put you at risk for serious complications.  Your personal target blood pressure may vary depending on your medical conditions, your age, and other factors. For most people, a normal blood pressure is less than 120/80.  Hypertension is treated with lifestyle changes, medicines, or a combination of both. Lifestyle changes include losing weight, eating a healthy, low-sodium diet, exercising more, and limiting alcohol. This information is not intended to replace advice given to you by your health care provider. Make sure you discuss any questions you have with your health care provider. Document Revised: 12/17/2017 Document Reviewed: 12/17/2017 Elsevier Patient Education  2020 [employer]vier  Inc.      Agustina Caroli, MD [facility]

## 2019-12-08 NOTE — Assessment & Plan Note (Signed)
Elevated TSH 6 months ago.  Repeated today.

## 2019-12-08 NOTE — Assessment & Plan Note (Signed)
Lipid profile done today.  Continue Zetia 10 mg daily.

## 2019-12-08 NOTE — Patient Instructions (Addendum)
   If you have lab work done today you will be contacted with your lab results within the next 2 weeks.  If you have not heard from us then please contact us. The fastest way to get your results is to register for My Chart.   IF you received an x-ray today, you will receive an invoice from [facility]. Please contact  Radiology at [phone] with questions or concerns regarding your invoice.   IF you received labwork today, you will receive an invoice from [facility]. Please contact [facility] at [phone] with questions or concerns regarding your invoice.   Our billing staff will not be able to assist you with questions regarding bills from these companies.  You will be contacted with the lab results as soon as they are available. The fastest way to get your results is to activate your My Chart account. Instructions are located on the last page of this paperwork. If you have not heard from us regarding the results in 2 weeks, please contact this office.      Hypertension, Adult High blood pressure (hypertension) is when the force of blood pumping through the arteries is too strong. The arteries are the blood vessels that carry blood from the heart throughout the body. Hypertension forces the heart to work harder to pump blood and may cause arteries to become narrow or stiff. Untreated or uncontrolled hypertension can cause a heart attack, heart failure, a stroke, kidney disease, and other problems. A blood pressure reading consists of a higher number over a lower number. Ideally, your blood pressure should be below 120/80. The first ("top") number is called the systolic pressure. It is a measure of the pressure in your arteries as your heart beats. The second ("bottom") number is called the diastolic pressure. It is a measure of the pressure in your arteries as the heart relaxes. What are the causes? The exact cause of this condition is not known. There are some conditions  that result in or are related to high blood pressure. What increases the risk? Some risk factors for high blood pressure are under your control. The following factors may make you more likely to develop this condition:  Smoking.  Having type 2 diabetes mellitus, high cholesterol, or both.  Not getting enough exercise or physical activity.  Being overweight.  Having too much fat, sugar, calories, or salt (sodium) in your diet.  Drinking too much alcohol. Some risk factors for high blood pressure may be difficult or impossible to change. Some of these factors include:  Having chronic kidney disease.  Having a family history of high blood pressure.  Age. Risk increases with age.  Race. You may be at higher risk if you are African American.  Gender. Men are at higher risk than women before age 45. After age 65, women are at higher risk than men.  Having obstructive sleep apnea.  Stress. What are the signs or symptoms? High blood pressure may not cause symptoms. Very high blood pressure (hypertensive crisis) may cause:  Headache.  Anxiety.  Shortness of breath.  Nosebleed.  Nausea and vomiting.  Vision changes.  Severe chest pain.  Seizures. How is this diagnosed? This condition is diagnosed by measuring your blood pressure while you are seated, with your arm resting on a flat surface, your legs uncrossed, and your feet flat on the floor. The cuff of the blood pressure monitor will be placed directly against the skin of your upper arm at the level of your   heart. It should be measured at least twice using the same arm. Certain conditions can cause a difference in blood pressure between your right and left arms. Certain factors can cause blood pressure readings to be lower or higher than normal for a short period of time:  When your blood pressure is higher when you are in a health care provider's office than when you are at home, this is called white coat hypertension.  Most people with this condition do not need medicines.  When your blood pressure is higher at home than when you are in a health care provider's office, this is called masked hypertension. Most people with this condition may need medicines to control blood pressure. If you have a high blood pressure reading during one visit or you have normal blood pressure with other risk factors, you may be asked to:  Return on a different day to have your blood pressure checked again.  Monitor your blood pressure at home for 1 week or longer. If you are diagnosed with hypertension, you may have other blood or imaging tests to help your health care provider understand your overall risk for other conditions. How is this treated? This condition is treated by making healthy lifestyle changes, such as eating healthy foods, exercising more, and reducing your alcohol intake. Your health care provider may prescribe medicine if lifestyle changes are not enough to get your blood pressure under control, and if:  Your systolic blood pressure is above 130.  Your diastolic blood pressure is above 80. Your personal target blood pressure may vary depending on your medical conditions, your age, and other factors. Follow these instructions at home: Eating and drinking   Eat a diet that is high in fiber and potassium, and low in sodium, added sugar, and fat. An example eating plan is called the DASH (Dietary Approaches to Stop Hypertension) diet. To eat this way: ? Eat plenty of fresh fruits and vegetables. Try to fill one half of your plate at each meal with fruits and vegetables. ? Eat whole grains, such as whole-wheat pasta, brown rice, or whole-grain bread. Fill about one fourth of your plate with whole grains. ? Eat or drink low-fat dairy products, such as skim milk or low-fat yogurt. ? Avoid fatty cuts of meat, processed or cured meats, and poultry with skin. Fill about one fourth of your plate with lean proteins, such  as fish, chicken without skin, beans, eggs, or tofu. ? Avoid pre-made and processed foods. These tend to be higher in sodium, added sugar, and fat.  Reduce your daily sodium intake. Most people with hypertension should eat less than 1,500 mg of sodium a day.  Do not drink alcohol if: ? Your health care provider tells you not to drink. ? You are pregnant, may be pregnant, or are planning to become pregnant.  If you drink alcohol: ? Limit how much you use to:  0-1 drink a day for women.  0-2 drinks a day for men. ? Be aware of how much alcohol is in your drink. In the [place] one drink equals one 12 oz bottle of beer (355 mL), one 5 oz glass of wine (148 mL), or one 1 oz glass of hard liquor (44 mL). Lifestyle   Work with your health care provider to maintain a healthy body weight or to lose weight. Ask what an ideal weight is for you.  Get at least 30 minutes of exercise most days of the week. Activities may include walking, swimming,   or biking.  Include exercise to strengthen your muscles (resistance exercise), such as Pilates or lifting weights, as part of your weekly exercise routine. Try to do these types of exercises for 30 minutes at least 3 days a week.  Do not use any products that contain nicotine or tobacco, such as cigarettes, e-cigarettes, and chewing tobacco. If you need help quitting, ask your health care provider.  Monitor your blood pressure at home as told by your health care provider.  Keep all follow-up visits as told by your health care provider. This is important. Medicines  Take over-the-counter and prescription medicines only as told by your health care provider. Follow directions carefully. Blood pressure medicines must be taken as prescribed.  Do not skip doses of blood pressure medicine. Doing this puts you at risk for problems and can make the medicine less effective.  Ask your health care provider about side effects or reactions to medicines that you  should watch for. Contact a health care provider if you:  Think you are having a reaction to a medicine you are taking.  Have headaches that keep coming back (recurring).  Feel dizzy.  Have swelling in your ankles.  Have trouble with your vision. Get help right away if you:  Develop a severe headache or confusion.  Have unusual weakness or numbness.  Feel faint.  Have severe pain in your chest or abdomen.  Vomit repeatedly.  Have trouble breathing. Summary  Hypertension is when the force of blood pumping through your arteries is too strong. If this condition is not controlled, it may put you at risk for serious complications.  Your personal target blood pressure may vary depending on your medical conditions, your age, and other factors. For most people, a normal blood pressure is less than 120/80.  Hypertension is treated with lifestyle changes, medicines, or a combination of both. Lifestyle changes include losing weight, eating a healthy, low-sodium diet, exercising more, and limiting alcohol. This information is not intended to replace advice given to you by your health care provider. Make sure you discuss any questions you have with your health care provider. Document Revised: 12/17/2017 Document Reviewed: 12/17/2017 Elsevier Patient Education  2020 [employer]  

## 2019-12-09 LAB — COMPREHENSIVE METABOLIC PANEL
ALT: 14 IU/L (ref 0–32)
AST: 15 IU/L (ref 0–40)
Albumin/Globulin Ratio: 1.5 (ref 1.2–2.2)
Albumin: 4.7 g/dL (ref 3.8–4.8)
Alkaline Phosphatase: 59 IU/L (ref 48–121)
BUN/Creatinine Ratio: 15 (ref 9–23)
BUN: 9 mg/dL (ref 6–24)
Bilirubin Total: 0.4 mg/dL (ref 0.0–1.2)
CO2: 24 mmol/L (ref 20–29)
Calcium: 9.6 mg/dL (ref 8.7–10.2)
Chloride: 100 mmol/L (ref 96–106)
Creatinine, Ser: 0.62 mg/dL (ref 0.57–1.00)
GFR calc Af Amer: 127 mL/min/{1.73_m2} (ref >59)
GFR calc non Af Amer: 110 mL/min/{1.73_m2} (ref >59)
Globulin, Total: 3.1 g/dL (ref 1.5–4.5)
Glucose: 93 mg/dL (ref 65–99)
Potassium: 3.7 mmol/L (ref 3.5–5.2)
Sodium: 138 mmol/L (ref 134–144)
Total Protein: 7.8 g/dL (ref 6.0–8.5)

## 2019-12-09 LAB — LIPID PANEL
Chol/HDL Ratio: 4.2 ratio (ref 0.0–4.4)
Cholesterol, Total: 220 mg/dL — ABNORMAL HIGH (ref 100–199)
HDL: 53 mg/dL (ref >39)
LDL Chol Calc (NIH): 146 mg/dL — ABNORMAL HIGH (ref 0–99)
Triglycerides: 118 mg/dL (ref 0–149)
VLDL Cholesterol Cal: 21 mg/dL (ref 5–40)

## 2019-12-09 LAB — HEMOGLOBIN A1C
Est. average glucose Bld gHb Est-mCnc: 120 mg/dL
Hgb A1c MFr Bld: 5.8 % — ABNORMAL HIGH (ref 4.8–5.6)

## 2019-12-09 LAB — TSH: TSH: 5.99 u[IU]/mL — ABNORMAL HIGH (ref 0.450–4.500)

## 2019-12-10 ENCOUNTER — Other Ambulatory Visit: Payer: Self-pay | Admitting: Emergency Medicine

## 2019-12-10 DIAGNOSIS — E039 Hypothyroidism, unspecified: Secondary | ICD-10-CM

## 2019-12-10 MED ORDER — LEVOTHYROXINE SODIUM 50 MCG PO TABS: 50.0000 ug | ORAL_TABLET | Freq: Every day | ORAL | 3 refills | Status: DC

## 2020-01-10 ENCOUNTER — Other Ambulatory Visit: Payer: Self-pay

## 2020-01-10 ENCOUNTER — Ambulatory Visit (INDEPENDENT_AMBULATORY_CARE_PROVIDER_SITE_OTHER): Payer: 59 | Admitting: Obstetrics and Gynecology

## 2020-01-10 ENCOUNTER — Encounter: Payer: Self-pay | Admitting: Obstetrics and Gynecology

## 2020-01-10 VITALS — BP 116/78 | HR 92 | Ht 63.25 in | Wt 196.0 lb

## 2020-01-10 DIAGNOSIS — Z01419 Encounter for gynecological examination (general) (routine) without abnormal findings: Secondary | ICD-10-CM

## 2020-01-10 NOTE — Patient Instructions (Addendum)
EXERCISE AND DIET:  We recommended that you start or continue a regular exercise program for good health. Regular exercise means any activity that makes your heart beat faster and makes you sweat.  We recommend exercising at least 30 minutes per day at least 3 days a week, preferably 4 or 5.  We also recommend a diet low in fat and sugar.  Inactivity, poor dietary choices and obesity can cause diabetes, heart attack, stroke, and kidney damage, among others.    ALCOHOL AND SMOKING:  Women should limit their alcohol intake to no more than 7 drinks/beers/glasses of wine (combined, not each!) per week. Moderation of alcohol intake to this level decreases your risk of breast cancer and liver damage. And of course, no recreational drugs are part of a healthy lifestyle.  And absolutely no smoking or even second hand smoke. Most people know smoking can cause heart and lung diseases, but did you know it also contributes to weakening of your bones? Aging of your skin?  Yellowing of your teeth and nails?  CALCIUM AND VITAMIN D:  Adequate intake of calcium and Vitamin D are recommended.  The recommendations for exact amounts of these supplements seem to change often, but generally speaking 1,000 mg of calcium (between diet and supplement) and 800 units of Vitamin D per day seems prudent. Certain women may benefit from higher intake of Vitamin D.  If you are among these women, your doctor will have told you during your visit.    PAP SMEARS:  Pap smears, to check for cervical cancer or precancers,  have traditionally been done yearly, although recent scientific advances have shown that most women can have pap smears less often.  However, every woman still should have a physical exam from her gynecologist every year. It will include a breast check, inspection of the vulva and vagina to check for abnormal growths or skin changes, a visual exam of the cervix, and then an exam to evaluate the size and shape of the uterus and  ovaries.  And after 44 years of age, a rectal exam is indicated to check for rectal cancers. We will also provide age appropriate advice regarding health maintenance, like when you should have certain vaccines, screening for sexually transmitted diseases, bone density testing, colonoscopy, mammograms, etc.   MAMMOGRAMS:  All women over 40 years old should have a yearly mammogram. Many facilities now offer a "3D" mammogram, which may cost around $50 extra out of pocket. If possible,  we recommend you accept the option to have the 3D mammogram performed.  It both reduces the number of women who will be called back for extra views which then turn out to be normal, and it is better than the routine mammogram at detecting truly abnormal areas.    COLON CANCER SCREENING: Now recommend starting at age 44. At this time colonoscopy is not covered for routine screening until 50. There are take home tests that can be done between 45-49.   COLONOSCOPY:  Colonoscopy to screen for colon cancer is recommended for all women at age 44  We know, you hate the idea of the prep.  We agree, BUT, having colon cancer and not knowing it is worse!!  Colon cancer so often starts as a polyp that can be seen and removed at colonscopy, which can quite literally save your life!  And if your first colonoscopy is normal and you have no family history of colon cancer, most women don't have to have it again for   10 years.  Once every ten years, you can do something that may end up saving your life, right?  We will be happy to help you get it scheduled when you are ready.  Be sure to check your insurance coverage so you understand how much it will cost.  It may be covered as a preventative service at no cost, but you should check your particular policy.      Breast Self-Awareness Breast self-awareness means being familiar with how your breasts look and feel. It involves checking your breasts regularly and reporting any changes to your  health care provider. Practicing breast self-awareness is important. A change in your breasts can be a sign of a serious medical problem. Being familiar with how your breasts look and feel allows you to find any problems early, when treatment is more likely to be successful. All women should practice breast self-awareness, including women who have had breast implants. How to do a breast self-exam One way to learn what is normal for your breasts and whether your breasts are changing is to do a breast self-exam. To do a breast self-exam: Look for Changes  1. Remove all the clothing above your waist. 2. Stand in front of a mirror in a room with good lighting. 3. Put your hands on your hips. 4. Push your hands firmly downward. 5. Compare your breasts in the mirror. Look for differences between them (asymmetry), such as: ? Differences in shape. ? Differences in size. ? Puckers, dips, and bumps in one breast and not the other. 6. Look at each breast for changes in your skin, such as: ? Redness. ? Scaly areas. 7. Look for changes in your nipples, such as: ? Discharge. ? Bleeding. ? Dimpling. ? Redness. ? A change in position. Feel for Changes Carefully feel your breasts for lumps and changes. It is best to do this while lying on your back on the floor and again while sitting or standing in the shower or tub with soapy water on your skin. Feel each breast in the following way:  Place the arm on the side of the breast you are examining above your head.  Feel your breast with the other hand.  Start in the nipple area and make  inch (2 cm) overlapping circles to feel your breast. Use the pads of your three middle fingers to do this. Apply light pressure, then medium pressure, then firm pressure. The light pressure will allow you to feel the tissue closest to the skin. The medium pressure will allow you to feel the tissue that is a little deeper. The firm pressure will allow you to feel the tissue  close to the ribs.  Continue the overlapping circles, moving downward over the breast until you feel your ribs below your breast.  Move one finger-width toward the center of the body. Continue to use the  inch (2 cm) overlapping circles to feel your breast as you move slowly up toward your collarbone.  Continue the up and down exam using all three pressures until you reach your armpit.  Write Down What You Find  Write down what is normal for each breast and any changes that you find. Keep a written record with breast changes or normal findings for each breast. By writing this information down, you do not need to depend only on memory for size, tenderness, or location. Write down where you are in your menstrual cycle, if you are still menstruating. If you are having trouble noticing differences   in your breasts, do not get discouraged. With time you will become more familiar with the variations in your breasts and more comfortable with the exam. How often should I examine my breasts? Examine your breasts every month. If you are breastfeeding, the best time to examine your breasts is after a feeding or after using a breast pump. If you menstruate, the best time to examine your breasts is 5-7 days after your period is over. During your period, your breasts are lumpier, and it may be more difficult to notice changes. When should I see my health care provider? See your health care provider if you notice:  A change in shape or size of your breasts or nipples.  A change in the skin of your breast or nipples, such as a reddened or scaly area.  Unusual discharge from your nipples.  A lump or thick area that was not there before.  Pain in your breasts.  Anything that concerns you.   Prediabetes Prediabetes is the condition of having a blood sugar (blood glucose) level that is higher than it should be, but not high enough for you to be diagnosed with type 2 diabetes. Having prediabetes puts you  at risk for developing type 2 diabetes (type 2 diabetes mellitus). Prediabetes may be called impaired glucose tolerance or impaired fasting glucose. Prediabetes usually does not cause symptoms. Your health care provider can diagnose this condition with blood tests. You may be tested for prediabetes if you are overweight and if you have at least one other risk factor for prediabetes. What is blood glucose, and how is it measured? Blood glucose refers to the amount of glucose in your bloodstream. Glucose comes from eating foods that contain sugars and starches (carbohydrates), which the body breaks down into glucose. Your blood glucose level may be measured in mg/dL (milligrams per deciliter) or mmol/L (millimoles per liter). Your blood glucose may be checked with one or more of the following blood tests:  A fasting blood glucose (FBG) test. You will not be allowed to eat (you will fast) for 8 hours or longer before a blood sample is taken. ? A normal range for FBG is 70-100 mg/dl (3.9-5.6 mmol/L).  An A1c (hemoglobin A1c) blood test. This test provides information about blood glucose control over the previous 2?16months.  An oral glucose tolerance test (OGTT). This test measures your blood glucose at two times: ? After fasting. This is your baseline level. ? Two hours after you drink a beverage that contains glucose. You may be diagnosed with prediabetes:  If your FBG is 100?125 mg/dL (5.6-6.9 mmol/L).  If your A1c level is 5.7?6.4%.  If your OGTT result is 140?199 mg/dL (7.8-11 mmol/L). These blood tests may be repeated to confirm your diagnosis. How can this condition affect me? The pancreas produces a hormone (insulin) that helps to move glucose from the bloodstream into cells. When cells in the body do not respond properly to insulin that the body makes (insulin resistance), excess glucose builds up in the blood instead of going into cells. As a result, high blood glucose (hyperglycemia) can  develop, which can cause many complications. Hyperglycemia is a symptom of prediabetes. Having high blood glucose for a long time is dangerous. Too much glucose in your blood can damage your nerves and blood vessels. Long-term damage can lead to complications from diabetes, which may include:  Heart disease.  Stroke.  Blindness.  Kidney disease.  Depression.  Poor circulation in the feet and legs, which could  lead to surgical removal (amputation) in severe cases. What can increase my risk? Risk factors for prediabetes include:  Having a family member with type 2 diabetes.  Being overweight or obese.  Being older than age 1.  Being of American [place], African-American, Hispanic/Latino, or Asian/Pacific Islander descent.  Having an inactive (sedentary) lifestyle.  Having a history of heart disease.  History of gestational diabetes or polycystic ovary syndrome (PCOS), in women.  Having low levels of good cholesterol (HDL-C) or high levels of blood fats (triglycerides).  Having high blood pressure. What actions can I take to prevent diabetes?      Be physically active. ? Do moderate-intensity physical activity for 30 or more minutes on 5 or more days of the week, or as much as told by your health care provider. This could be brisk walking, biking, or water aerobics. ? Ask your health care provider what activities are safe for you. A mix of physical activities may be best, such as walking, swimming, cycling, and strength training.  Lose weight as told by your health care provider. ? Losing 5-7% of your body weight can reverse insulin resistance. ? Your health care provider can determine how much weight loss is best for you and can help you lose weight safely.  Follow a healthy meal plan. This includes eating lean proteins, complex carbohydrates, fresh fruits and vegetables, low-fat dairy products, and healthy fats. ? Follow instructions from your health care provider about  eating or drinking restrictions. ? Make an appointment to see a diet and nutrition specialist (registered dietitian) to help you create a healthy eating plan that is right for you.  Do not smoke or use any tobacco products, such as cigarettes, chewing tobacco, and e-cigarettes. If you need help quitting, ask your health care provider.  Take over-the-counter and prescription medicines as told by your health care provider. You may be prescribed medicines that help lower the risk of type 2 diabetes.  Keep all follow-up visits as told by your health care provider. This is important. Summary  Prediabetes is the condition of having a blood sugar (blood glucose) level that is higher than it should be, but not high enough for you to be diagnosed with type 2 diabetes.  Having prediabetes puts you at risk for developing type 2 diabetes (type 2 diabetes mellitus).  To help prevent type 2 diabetes, make lifestyle changes such as being physically active and eating a healthy diet. Lose weight as told by your health care provider. This information is not intended to replace advice given to you by your health care provider. Make sure you discuss any questions you have with your health care provider. Document Revised: 07/31/2018 Document Reviewed: 05/30/2015 Elsevier Patient Education  [place].

## 2020-01-10 NOTE — Progress Notes (Signed)
44 y.o. [place] Married Black or [place] American Not Hispanic or Latino female here for annual exam.    Husband died last 03/19/23 at 8 years. He just didn't wake up.  They were together x 20 years.  She is doing okay, working a lot, spending time with friends. Mom moved in with her.   Menses q month x 4 days. Saturates a super tampon in 4 hours. Cramps are mild. No BTB.  She has started having night sweats intermittently.   Just started on synthroid this summer. Has prediabetes.     Patient's last menstrual period was 12/17/2019.          Sexually active: No.  The current method of family planning is none and abstinence.    Exercising: No.  The patient does not participate in regular exercise at present. Smoker:  no  Health Maintenance: Pap:  06/19/16 Neg:Neg HR HPV History of abnormal Pap:  Years ago per patient, f/u okay MMG:  01/20/19 BIRADS 1 negative BMD:   n/a Colonoscopy: n/a TDaP:  2016 Gardasil: none   reports that she has never smoked. She has never used smokeless tobacco. She reports current alcohol use of about 14.0 standard drinks of alcohol per week. She reports that she does not use drugs. 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 Close with her nieces, nephew and step children. From [place], all of her family is here.  Past Medical History:  Diagnosis Date   Allergy    Anxiety    High blood pressure     Past Surgical History:  Procedure Laterality Date   EYE SURGERY Right 2009   Tightened muscle of eye    Current Outpatient Medications  Medication Sig Dispense Refill   amLODipine (NORVASC) 10 MG tablet TAKE 1 TABLET BY MOUTH  DAILY 90 tablet 0   atenolol (TENORMIN) 50 MG tablet Take 1 tablet (50 mg total) by mouth daily. 90 tablet 3   betamethasone dipropionate (DIPROLENE) 0.05 % ointment Apply topically 2 (two) times daily.     ezetimibe (ZETIA) 10 MG tablet TAKE 1 TABLET BY MOUTH  DAILY 90 tablet 1   irbesartan (AVAPRO) 150 MG tablet TAKE 1 TABLET BY  MOUTH  DAILY 90 tablet 0   levothyroxine (SYNTHROID) 50 MCG tablet Take 1 tablet (50 mcg total) by mouth daily. 90 tablet 3   No current facility-administered medications for this visit.    Family History  Problem Relation Age of Onset   Hypertension Mother    Stroke Mother    Thyroid disease Sister     Review of Systems  Constitutional: Negative.   HENT: Negative.   Eyes: Negative.   Respiratory: Negative.   Cardiovascular: Negative.   Gastrointestinal: Negative.   Endocrine: Negative.   Genitourinary: Negative.   Musculoskeletal: Negative.   Skin: Negative.   Allergic/Immunologic: Negative.   Neurological: Negative.   Hematological: Negative.   Psychiatric/Behavioral: Negative.     Exam:   BP 116/78 (BP Location: Right Arm, Patient Position: Sitting, Cuff Size: Normal)    Pulse 92    Ht 5' 3.25" (1.607 m)    Wt 196 lb (88.9 kg)    LMP 12/17/2019    SpO2 98%    BMI 34.45 kg/m   Weight change: @WEIGHTCHANGE @ Height:   Height: 5' 3.25" (160.7 cm)  Ht Readings from Last 3 Encounters:  01/10/20 5' 3.25" (1.607 m)  12/08/19 5\' 4"  (1.626 m)  06/07/19 5\' 4"  (1.626 m)    General appearance: alert, cooperative and appears  stated age Head: Normocephalic, without obvious abnormality, atraumatic Neck: no adenopathy, supple, symmetrical, trachea midline and thyroid normal to inspection and palpation Lungs: clear to auscultation bilaterally Cardiovascular: regular rate and rhythm Breasts: normal appearance, no masses or tenderness Abdomen: soft, non-tender; non distended,  no masses,  no organomegaly Extremities: extremities normal, atraumatic, no cyanosis or edema Skin: Skin color, texture, turgor normal. No rashes or lesions Lymph nodes: Cervical, supraclavicular, and axillary nodes normal. No abnormal inguinal nodes palpated Neurologic: Grossly normal   Pelvic: External genitalia:  no lesions              Urethra:  normal appearing urethra with no masses, tenderness or  lesions              Bartholins and Skenes: normal                 Vagina: normal appearing vagina with normal color and discharge, no lesions              Cervix: no lesions               Bimanual Exam:  Uterus:  normal size, contour, position, consistency, mobility, non-tender              Adnexa: no mass, fullness, tenderness               Rectovaginal: Confirms               Anus:  normal sphincter tone, no lesions  Terence Lux chaperoned for the exam.  A:  Well Woman with normal exam  Recently started on synthroid  Prediabetes  P:   No pap this year  Mammogram due, she will schedule  Labs just done with primary  Discussed breast self exam  Discussed calcium and vit D intake  Discussed eating a healthy diet

## 2020-01-26 LAB — HM MAMMOGRAPHY

## 2020-01-31 ENCOUNTER — Encounter: Payer: Self-pay | Admitting: *Deleted

## 2020-02-25 ENCOUNTER — Other Ambulatory Visit: Payer: Self-pay | Admitting: Emergency Medicine

## 2020-05-27 ENCOUNTER — Other Ambulatory Visit: Payer: Self-pay | Admitting: Emergency Medicine

## 2020-05-27 DIAGNOSIS — I10 Essential (primary) hypertension: Secondary | ICD-10-CM

## 2020-06-12 ENCOUNTER — Ambulatory Visit: Payer: 59 | Admitting: Emergency Medicine

## 2020-06-19 ENCOUNTER — Ambulatory Visit: Payer: 59 | Admitting: Emergency Medicine

## 2020-06-25 ENCOUNTER — Other Ambulatory Visit: Payer: Self-pay | Admitting: Emergency Medicine

## 2020-06-25 DIAGNOSIS — E785 Hyperlipidemia, unspecified: Secondary | ICD-10-CM

## 2020-06-26 NOTE — Telephone Encounter (Signed)
Requested Prescriptions  Pending Prescriptions Disp Refills  . irbesartan (AVAPRO) 150 MG tablet [Pharmacy Med Name: IRBESARTAN  150MG   TAB] 30 tablet 0    Sig: TAKE 1 TABLET BY MOUTH  DAILY     Cardiovascular:  Angiotensin Receptor Blockers Failed - 06/25/2020  9:35 PM      Failed - Cr in normal range and within 180 days    Creat  Date Value Ref Range Status  12/16/2015 0.60 0.50 - 1.10 mg/dL Final   Creatinine, Ser  Date Value Ref Range Status  12/08/2019 0.62 0.57 - 1.00 mg/dL Final         Failed - K in normal range and within 180 days    Potassium  Date Value Ref Range Status  12/08/2019 3.7 3.5 - 5.2 mmol/L Final         Failed - Valid encounter within last 6 months    Recent Outpatient Visits          6 months ago Essential hypertension   [facility] at [place], Ines Bloomer, MD   1 year ago Essential hypertension   [facility] at [place], Ines Bloomer, MD   1 year ago Dizziness   [facility] at [place], Ines Bloomer, MD   1 year ago Essential hypertension   [facility] at [place], Ines Bloomer, MD   1 year ago Routine general medical examination at a health care facility   [place] [facility], Ines Bloomer, [place]            In 1 week [place], Ines Bloomer, MD [facility] at [place], [place] - Patient is not pregnant      Passed - Last BP in normal range    BP Readings from Last 1 Encounters:  01/10/20 116/78         . amLODipine (NORVASC) 10 MG tablet [Pharmacy Med Name: amLODIPine Besylate 10 MG Oral Tablet] 90 tablet 3    Sig: TAKE 1 TABLET BY MOUTH  DAILY     Cardiovascular:  Calcium Channel Blockers Failed - 06/25/2020  9:35 PM      Failed - Valid encounter within last 6 months    Recent Outpatient Visits          6 months ago Essential hypertension   [facility] at [facility], Ines Bloomer, MD   1 year ago Essential hypertension   [facility] at [place],  Ines Bloomer, MD   1 year ago Dizziness   [facility] at [place], Ines Bloomer, MD   1 year ago Essential hypertension   [facility] at [place], Ines Bloomer, MD   1 year ago Routine general medical examination at a health care facility   [place] at [facility], Ines Bloomer, [place]            In 1 week [place], Ines Bloomer, MD Primary [facility] at [place], [place] - Last BP in normal range    BP Readings from Last 1 Encounters:  01/10/20 116/78         . ezetimibe (ZETIA) 10 MG tablet [Pharmacy Med Name: Ezetimibe 10 MG Oral Tablet] 90 tablet 3    Sig: TAKE 1 TABLET BY MOUTH  DAILY     Cardiovascular:  Antilipid -  Sterol Transport Inhibitors Failed - 06/25/2020  9:35 PM      Failed - Total Cholesterol in normal range and within 360 days    Cholesterol, Total  Date Value Ref Range Status  12/08/2019 220 (H) 100 - 199 mg/dL Final         Failed - LDL in normal range and within 360 days    LDL Chol Calc (NIH)  Date Value Ref Range Status  12/08/2019 146 (H) 0 - 99 mg/dL Final         Passed - HDL in normal range and within 360 days    HDL  Date Value Ref Range Status  12/08/2019 53 >39 mg/dL Final         Passed - Triglycerides in normal range and within 360 days    Triglycerides  Date Value Ref Range Status  12/08/2019 118 0 - 149 mg/dL Final         Passed - Valid encounter within last 12 months    Recent Outpatient Visits          6 months ago Essential hypertension   [facility] at [place], Ines Bloomer, MD   1 year ago Essential hypertension   [facility] at [place], Ines Bloomer, MD   1 year ago Dizziness   [facility] at [place], Ines Bloomer, MD   1 year ago Essential hypertension   [facility] at [place], Ines Bloomer, MD   1 year ago Routine general medical examination at a health care facility   [facility] at [facility], Ines Bloomer, MD      Future  Appointments            In 1 week [place], Ines Bloomer, MD Primary Care at [place], [facility]

## 2020-06-28 ENCOUNTER — Ambulatory Visit: Payer: 59 | Admitting: Emergency Medicine

## 2020-07-04 ENCOUNTER — Ambulatory Visit: Payer: 59 | Admitting: Emergency Medicine

## 2020-08-06 ENCOUNTER — Encounter: Payer: Self-pay | Admitting: Emergency Medicine

## 2020-08-06 NOTE — Progress Notes (Signed)
BP Readings from Last 3 Encounters:  01/10/20 116/78  12/08/19 138/77  06/07/19 123/77   Ann Thornton Mac 45 y.o.   Chief Complaint  Patient presents with  . Hypertension    Follow up   . Medication Refill    Atenolol    HISTORY OF PRESENT ILLNESS: This is a 45 y.o. female with several chronic medical problems here for follow-up. Doing well has no complaints or medical concerns today. Normal mammogram last year. #1 history of hypertension: On amlodipine, irbesartan, and Tenormin #2 recently diagnosed with hypothyroidism and started on Synthroid 50 mcg daily. Feels better, more energy, sleeping better, overall clinically improved. #3 history of prediabetes #4 dyslipidemia, intolerant to statins, on Zetia 10 mg daily.  HPI   Prior to Admission medications   Medication Sig Start Date End Date Taking? Authorizing Provider  amLODipine (NORVASC) 10 MG tablet TAKE 1 TABLET BY MOUTH  DAILY 06/26/20   Horald Pollen, MD  atenolol (TENORMIN) 50 MG tablet TAKE 1 TABLET(50 MG) BY MOUTH DAILY 05/27/20   Horald Pollen, MD  betamethasone dipropionate (DIPROLENE) 0.05 % ointment Apply topically 2 (two) times daily. 12/23/19   [provider]  ezetimibe (ZETIA) 10 MG tablet TAKE 1 TABLET BY MOUTH  DAILY 06/26/20   Horald Pollen, MD  irbesartan (AVAPRO) 150 MG tablet TAKE 1 TABLET BY MOUTH  DAILY 06/26/20   Horald Pollen, MD  levothyroxine (SYNTHROID) 50 MCG tablet Take 1 tablet (50 mcg total) by mouth daily. 12/10/19   Horald Pollen, MD    No Known Allergies  Patient Active Problem List   Diagnosis Date Noted  . Dyslipidemia 12/08/2019  . Prediabetes 12/08/2019  . Abnormal thyroid blood test 12/08/2019  . Mixed hyperlipidemia 07/07/2018  . Essential hypertension 03/23/2015  . Anxiety disorder 06/02/2014    Past Medical History:  Diagnosis Date  . Allergy   . Anxiety   . High blood pressure     Past Surgical History:  Procedure  Laterality Date  . EYE SURGERY Right 2009   Tightened muscle of eye    Social History   Socioeconomic History  . Marital status: Widowed    Spouse name: Not on file  . Number of children: Not on file  . Years of education: Not on file  . Highest education level: Not on file  Occupational History  . Occupation: Research scientist (physical sciences): [employer]  Tobacco Use  . Smoking status: Never Smoker  . Smokeless tobacco: Never Used  Vaping Use  . Vaping Use: Never used  Substance and Sexual Activity  . Alcohol use: Yes    Alcohol/week: 14.0 standard drinks    Types: 14 Cans of beer per week    Comment: 3 beers a day  . Drug use: No  . Sexual activity: Not Currently    Partners: Male    Birth control/protection: None  Other Topics Concern  . Not on file  Social History Narrative   Single without children   Working from home   Exercise - zumba once a week, fitness workout on Mondays   Diet - trying to be good, eating no salt.          As of 11/08/15:      Diet: N/A   Caffeine: No Sodas   Married: Engaged   House: Lives in apartment, 2 stories, 1 person    Pets: None   Current/Past profession: Data Entry    Exercise: No  Living Will: No    DNR: No    POA/HPOA: No       Social Determinants of Health   Financial Resource Strain: Not on file  Food Insecurity: Not on file  Transportation Needs: Not on file  Physical Activity: Not on file  Stress: Not on file  Social Connections: Not on file  Intimate Partner Violence: Not on file    Family History  Problem Relation Age of Onset  . Hypertension Mother   . Stroke Mother   . Thyroid disease Sister      Review of Systems  Constitutional: Negative.  Negative for chills and fever.  HENT: Negative.  Negative for congestion and sore throat.   Respiratory: Negative.  Negative for cough and shortness of breath.   Cardiovascular: Negative.  Negative for chest pain and palpitations.  Gastrointestinal:  Negative.  Negative for abdominal pain, blood in stool, diarrhea, melena, nausea and vomiting.  Genitourinary: Negative.  Negative for dysuria and hematuria.  Musculoskeletal: Negative.  Negative for back pain, myalgias and neck pain.  Skin: Negative.  Negative for rash.  Neurological: Negative.  Negative for dizziness and headaches.  All other systems reviewed and are negative.   Today's Vitals   08/07/20 0853  BP: 116/80  Pulse: 82  Temp: 98.6 F (37 C)  TempSrc: Oral  SpO2: 96%  Weight: 192 lb 9.6 oz (87.4 kg)  Height: 5\' 4"  (1.626 m)   Body mass index is 33.06 kg/m. Wt Readings from Last 3 Encounters:  08/07/20 192 lb 9.6 oz (87.4 kg)  01/10/20 196 lb (88.9 kg)  12/08/19 195 lb 6.4 oz (88.6 kg)    Physical Exam Vitals reviewed.  Constitutional:      Appearance: Normal appearance.  HENT:     Head: Normocephalic.  Eyes:     Extraocular Movements: Extraocular movements intact.     Conjunctiva/sclera: Conjunctivae normal.     Pupils: Pupils are equal, round, and reactive to light.  Cardiovascular:     Rate and Rhythm: Normal rate and regular rhythm.     Pulses: Normal pulses.     Heart sounds: Normal heart sounds.  Pulmonary:     Effort: Pulmonary effort is normal.     Breath sounds: Normal breath sounds.  Abdominal:     General: Bowel sounds are normal. There is no distension.     Palpations: Abdomen is soft.     Tenderness: There is no abdominal tenderness.  Musculoskeletal:        General: Normal range of motion.     Cervical back: Normal range of motion and neck supple.     Right lower leg: No edema.     Left lower leg: No edema.  Skin:    General: Skin is warm and dry.     Capillary Refill: Capillary refill takes less than 2 seconds.  Neurological:     General: No focal deficit present.     Mental Status: She is alert and oriented to person, place, and time.  Psychiatric:        Mood and Affect: Mood normal.        Behavior: Behavior normal.       ASSESSMENT & PLAN: Clinically stable.  No medical concerns identified during this visit. Continue present medications.  No changes. Follow-up in 6 months. Essential hypertension Well-controlled hypertension.  Continue present medications.  Advised to start monitoring blood pressure readings at home. Diet and nutrition discussed.  Hypothyroidism Clinically euthyroid.  Tolerating medication well and  quality of life much improved. Continue Synthroid 50 mcg daily.  Dyslipidemia Compliant with Zetia 10 mg daily.  Better nutrition. Diet and nutrition discussed.  Prediabetes Prediabetes addressed.  Diet and nutrition discussed.  Helvi was seen today for hypertension and medication refill.  Diagnoses and all orders for this visit:  Essential hypertension -     Comprehensive metabolic panel -     atenolol (TENORMIN) 50 MG tablet; Take 1 tablet (50 mg total) by mouth daily.  Dyslipidemia -     Lipid panel  Prediabetes -     Hemoglobin A1c  Hypothyroidism, unspecified type -     TSH    Patient Instructions   Health Maintenance, Female Adopting a healthy lifestyle and getting preventive care are important in promoting health and wellness. Ask your health care provider about:  The right schedule for you to have regular tests and exams.  Things you can do on your own to prevent diseases and keep yourself healthy. What should I know about diet, weight, and exercise? Eat a healthy diet  Eat a diet that includes plenty of vegetables, fruits, low-fat dairy products, and lean protein.  Do not eat a lot of foods that are high in solid fats, added sugars, or sodium.   Maintain a healthy weight Body mass index (BMI) is used to identify weight problems. It estimates body fat based on height and weight. Your health care provider can help determine your BMI and help you achieve or maintain a healthy weight. Get regular exercise Get regular exercise. This is one of the most  important things you can do for your health. Most adults should:  Exercise for at least 150 minutes each week. The exercise should increase your heart rate and make you sweat (moderate-intensity exercise).  Do strengthening exercises at least twice a week. This is in addition to the moderate-intensity exercise.  Spend less time sitting. Even light physical activity can be beneficial. Watch cholesterol and blood lipids Have your blood tested for lipids and cholesterol at 45 years of age, then have this test every 5 years. Have your cholesterol levels checked more often if:  Your lipid or cholesterol levels are high.  You are older than 45 years of age.  You are at high risk for heart disease. What should I know about cancer screening? Depending on your health history and family history, you may need to have cancer screening at various ages. This may include screening for:  Breast cancer.  Cervical cancer.  Colorectal cancer.  Skin cancer.  Lung cancer. What should I know about heart disease, diabetes, and high blood pressure? Blood pressure and heart disease  High blood pressure causes heart disease and increases the risk of stroke. This is more likely to develop in people who have high blood pressure readings, are of African descent, or are overweight.  Have your blood pressure checked: ? Every 3-5 years if you are 28-59 years of age. ? Every year if you are 79 years old or older. Diabetes Have regular diabetes screenings. This checks your fasting blood sugar level. Have the screening done:  Once every three years after age 76 if you are at a normal weight and have a low risk for diabetes.  More often and at a younger age if you are overweight or have a high risk for diabetes. What should I know about preventing infection? Hepatitis B If you have a higher risk for hepatitis B, you should be screened for this virus. Talk with  your health care provider to find out if you are  at risk for hepatitis B infection. Hepatitis C Testing is recommended for:  Everyone born from [birth year] through 1965.  Anyone with known risk factors for hepatitis C. Sexually transmitted infections (STIs)  Get screened for STIs, including gonorrhea and chlamydia, if: ? You are sexually active and are younger than 45 years of age. ? You are older than 45 years of age and your health care provider tells you that you are at risk for this type of infection. ? Your sexual activity has changed since you were last screened, and you are at increased risk for chlamydia or gonorrhea. Ask your health care provider if you are at risk.  Ask your health care provider about whether you are at high risk for HIV. Your health care provider may recommend a prescription medicine to help prevent HIV infection. If you choose to take medicine to prevent HIV, you should first get tested for HIV. You should then be tested every 3 months for as long as you are taking the medicine. Pregnancy  If you are about to stop having your period (premenopausal) and you may become pregnant, seek counseling before you get pregnant.  Take 400 to 800 micrograms (mcg) of folic acid every day if you become pregnant.  Ask for birth control (contraception) if you want to prevent pregnancy. Osteoporosis and menopause Osteoporosis is a disease in which the bones lose minerals and strength with aging. This can result in bone fractures. If you are 11 years old or older, or if you are at risk for osteoporosis and fractures, ask your health care provider if you should:  Be screened for bone loss.  Take a calcium or vitamin D supplement to lower your risk of fractures.  Be given hormone replacement therapy (HRT) to treat symptoms of menopause. Follow these instructions at home: Lifestyle  Do not use any products that contain nicotine or tobacco, such as cigarettes, e-cigarettes, and chewing tobacco. If you need help quitting, ask your  health care provider.  Do not use street drugs.  Do not share needles.  Ask your health care provider for help if you need support or information about quitting drugs. Alcohol use  Do not drink alcohol if: ? Your health care provider tells you not to drink. ? You are pregnant, may be pregnant, or are planning to become pregnant.  If you drink alcohol: ? Limit how much you use to 0-1 drink a day. ? Limit intake if you are breastfeeding.  Be aware of how much alcohol is in your drink. In the [place] one drink equals one 12 oz bottle of beer (355 mL), one 5 oz glass of wine (148 mL), or one 1 oz glass of hard liquor (44 mL). General instructions  Schedule regular health, dental, and eye exams.  Stay current with your vaccines.  Tell your health care provider if: ? You often feel depressed. ? You have ever been abused or do not feel safe at home. Summary  Adopting a healthy lifestyle and getting preventive care are important in promoting health and wellness.  Follow your health care provider's instructions about healthy diet, exercising, and getting tested or screened for diseases.  Follow your health care provider's instructions on monitoring your cholesterol and blood pressure. This information is not intended to replace advice given to you by your health care provider. Make sure you discuss any questions you have with your health care provider. Document Revised: 04/01/2018 Document  Reviewed: 04/01/2018 Elsevier Patient Education  2021 [place], MD [facility]

## 2020-08-07 ENCOUNTER — Ambulatory Visit (INDEPENDENT_AMBULATORY_CARE_PROVIDER_SITE_OTHER): Payer: 59 | Admitting: Emergency Medicine

## 2020-08-07 ENCOUNTER — Other Ambulatory Visit: Payer: Self-pay

## 2020-08-07 VITALS — BP 116/80 | HR 82 | Temp 98.6°F | Ht 64.0 in | Wt 192.6 lb

## 2020-08-07 DIAGNOSIS — E785 Hyperlipidemia, unspecified: Secondary | ICD-10-CM | POA: Diagnosis not present

## 2020-08-07 DIAGNOSIS — R7303 Prediabetes: Secondary | ICD-10-CM

## 2020-08-07 DIAGNOSIS — E039 Hypothyroidism, unspecified: Secondary | ICD-10-CM | POA: Diagnosis not present

## 2020-08-07 DIAGNOSIS — Z1159 Encounter for screening for other viral diseases: Secondary | ICD-10-CM

## 2020-08-07 DIAGNOSIS — I10 Essential (primary) hypertension: Secondary | ICD-10-CM

## 2020-08-07 LAB — LIPID PANEL
Cholesterol: 168 mg/dL (ref 0–200)
HDL: 50.3 mg/dL (ref >39.00)
LDL Cholesterol: 105 mg/dL — ABNORMAL HIGH (ref 0–99)
NonHDL: 117.37
Total CHOL/HDL Ratio: 3
Triglycerides: 64 mg/dL (ref 0.0–149.0)
VLDL: 12.8 mg/dL (ref 0.0–40.0)

## 2020-08-07 LAB — COMPREHENSIVE METABOLIC PANEL
ALT: 12 U/L (ref 0–35)
AST: 11 U/L (ref 0–37)
Albumin: 4.1 g/dL (ref 3.5–5.2)
Alkaline Phosphatase: 57 U/L (ref 39–117)
BUN: 10 mg/dL (ref 6–23)
CO2: 29 mEq/L (ref 19–32)
Calcium: 9 mg/dL (ref 8.4–10.5)
Chloride: 102 mEq/L (ref 96–112)
Creatinine, Ser: 0.66 mg/dL (ref 0.40–1.20)
GFR: 106.38 mL/min (ref >60.00)
Glucose, Bld: 111 mg/dL — ABNORMAL HIGH (ref 70–99)
Potassium: 4 mEq/L (ref 3.5–5.1)
Sodium: 138 mEq/L (ref 135–145)
Total Bilirubin: 0.2 mg/dL (ref 0.2–1.2)
Total Protein: 7.4 g/dL (ref 6.0–8.3)

## 2020-08-07 LAB — HEMOGLOBIN A1C: Hgb A1c MFr Bld: 5.6 % (ref 4.6–6.5)

## 2020-08-07 LAB — TSH: TSH: 0.97 u[IU]/mL (ref 0.35–4.50)

## 2020-08-07 MED ORDER — ATENOLOL 50 MG PO TABS: 50.0000 mg | ORAL_TABLET | Freq: Every day | ORAL | 3 refills | Status: DC

## 2020-08-07 NOTE — Patient Instructions (Signed)
Health Maintenance, Female Adopting a healthy lifestyle and getting preventive care are important in promoting health and wellness. Ask your health care provider about:  The right schedule for you to have regular tests and exams.  Things you can do on your own to prevent diseases and keep yourself healthy. What should I know about diet, weight, and exercise? Eat a healthy diet  Eat a diet that includes plenty of vegetables, fruits, low-fat dairy products, and lean protein.  Do not eat a lot of foods that are high in solid fats, added sugars, or sodium.   Maintain a healthy weight Body mass index (BMI) is used to identify weight problems. It estimates body fat based on height and weight. Your health care provider can help determine your BMI and help you achieve or maintain a healthy weight. Get regular exercise Get regular exercise. This is one of the most important things you can do for your health. Most adults should:  Exercise for at least 150 minutes each week. The exercise should increase your heart rate and make you sweat (moderate-intensity exercise).  Do strengthening exercises at least twice a week. This is in addition to the moderate-intensity exercise.  Spend less time sitting. Even light physical activity can be beneficial. Watch cholesterol and blood lipids Have your blood tested for lipids and cholesterol at 45 years of age, then have this test every 5 years. Have your cholesterol levels checked more often if:  Your lipid or cholesterol levels are high.  You are older than 45 years of age.  You are at high risk for heart disease. What should I know about cancer screening? Depending on your health history and family history, you may need to have cancer screening at various ages. This may include screening for:  Breast cancer.  Cervical cancer.  Colorectal cancer.  Skin cancer.  Lung cancer. What should I know about heart disease, diabetes, and high blood  pressure? Blood pressure and heart disease  High blood pressure causes heart disease and increases the risk of stroke. This is more likely to develop in people who have high blood pressure readings, are of African descent, or are overweight.  Have your blood pressure checked: ? Every 3-5 years if you are 18-39 years of age. ? Every year if you are 40 years old or older. Diabetes Have regular diabetes screenings. This checks your fasting blood sugar level. Have the screening done:  Once every three years after age 40 if you are at a normal weight and have a low risk for diabetes.  More often and at a younger age if you are overweight or have a high risk for diabetes. What should I know about preventing infection? Hepatitis B If you have a higher risk for hepatitis B, you should be screened for this virus. Talk with your health care provider to find out if you are at risk for hepatitis B infection. Hepatitis C Testing is recommended for:  Everyone born from 1945 through 1965.  Anyone with known risk factors for hepatitis C. Sexually transmitted infections (STIs)  Get screened for STIs, including gonorrhea and chlamydia, if: ? You are sexually active and are younger than 45 years of age. ? You are older than 45 years of age and your health care provider tells you that you are at risk for this type of infection. ? Your sexual activity has changed since you were last screened, and you are at increased risk for chlamydia or gonorrhea. Ask your health care provider   if you are at risk.  Ask your health care provider about whether you are at high risk for HIV. Your health care provider may recommend a prescription medicine to help prevent HIV infection. If you choose to take medicine to prevent HIV, you should first get tested for HIV. You should then be tested every 3 months for as long as you are taking the medicine. Pregnancy  If you are about to stop having your period (premenopausal) and  you may become pregnant, seek counseling before you get pregnant.  Take 400 to 800 micrograms (mcg) of folic acid every day if you become pregnant.  Ask for birth control (contraception) if you want to prevent pregnancy. Osteoporosis and menopause Osteoporosis is a disease in which the bones lose minerals and strength with aging. This can result in bone fractures. If you are 65 years old or older, or if you are at risk for osteoporosis and fractures, ask your health care provider if you should:  Be screened for bone loss.  Take a calcium or vitamin D supplement to lower your risk of fractures.  Be given hormone replacement therapy (HRT) to treat symptoms of menopause. Follow these instructions at home: Lifestyle  Do not use any products that contain nicotine or tobacco, such as cigarettes, e-cigarettes, and chewing tobacco. If you need help quitting, ask your health care provider.  Do not use street drugs.  Do not share needles.  Ask your health care provider for help if you need support or information about quitting drugs. Alcohol use  Do not drink alcohol if: ? Your health care provider tells you not to drink. ? You are pregnant, may be pregnant, or are planning to become pregnant.  If you drink alcohol: ? Limit how much you use to 0-1 drink a day. ? Limit intake if you are breastfeeding.  Be aware of how much alcohol is in your drink. In the [place] one drink equals one 12 oz bottle of beer (355 mL), one 5 oz glass of wine (148 mL), or one 1 oz glass of hard liquor (44 mL). General instructions  Schedule regular health, dental, and eye exams.  Stay current with your vaccines.  Tell your health care provider if: ? You often feel depressed. ? You have ever been abused or do not feel safe at home. Summary  Adopting a healthy lifestyle and getting preventive care are important in promoting health and wellness.  Follow your health care provider's instructions about healthy  diet, exercising, and getting tested or screened for diseases.  Follow your health care provider's instructions on monitoring your cholesterol and blood pressure. This information is not intended to replace advice given to you by your health care provider. Make sure you discuss any questions you have with your health care provider. Document Revised: 04/01/2018 Document Reviewed: 04/01/2018 Elsevier Patient Education  2021 [employer] Inc.  

## 2020-08-07 NOTE — Assessment & Plan Note (Signed)
Well-controlled hypertension.  Continue present medications.  Advised to start monitoring blood pressure readings at home. Diet and nutrition discussed.

## 2020-08-07 NOTE — Assessment & Plan Note (Signed)
Compliant with Zetia 10 mg daily.  Better nutrition. Diet and nutrition discussed.

## 2020-08-07 NOTE — Assessment & Plan Note (Signed)
Prediabetes addressed.  Diet and nutrition discussed.

## 2020-08-07 NOTE — Assessment & Plan Note (Signed)
Clinically euthyroid.  Tolerating medication well and quality of life much improved. Continue Synthroid 50 mcg daily.

## 2020-08-10 ENCOUNTER — Other Ambulatory Visit: Payer: Self-pay | Admitting: Emergency Medicine

## 2020-08-10 DIAGNOSIS — E785 Hyperlipidemia, unspecified: Secondary | ICD-10-CM

## 2020-08-10 DIAGNOSIS — I10 Essential (primary) hypertension: Secondary | ICD-10-CM

## 2020-08-11 ENCOUNTER — Other Ambulatory Visit: Payer: Self-pay

## 2020-08-11 DIAGNOSIS — E785 Hyperlipidemia, unspecified: Secondary | ICD-10-CM

## 2020-08-11 MED ORDER — IRBESARTAN 150 MG PO TABS: 150.0000 mg | ORAL_TABLET | Freq: Every day | ORAL | 0 refills | Status: DC

## 2020-08-11 MED ORDER — AMLODIPINE BESYLATE 10 MG PO TABS: 1.0000 | ORAL_TABLET | Freq: Every day | ORAL | 0 refills | Status: DC

## 2020-08-11 MED ORDER — EZETIMIBE 10 MG PO TABS: 10.0000 mg | ORAL_TABLET | Freq: Every day | ORAL | 0 refills | Status: DC

## 2020-08-11 NOTE — Telephone Encounter (Signed)
Refilled Amlodipine, irbesartan, and ezetimibe.

## 2020-08-11 NOTE — Telephone Encounter (Signed)
Refilled medication

## 2020-08-12 ENCOUNTER — Other Ambulatory Visit: Payer: Self-pay | Admitting: Emergency Medicine

## 2020-10-11 ENCOUNTER — Encounter: Payer: Self-pay | Admitting: Emergency Medicine

## 2020-10-11 ENCOUNTER — Ambulatory Visit: Payer: 59 | Admitting: Emergency Medicine

## 2020-10-11 DIAGNOSIS — Z0289 Encounter for other administrative examinations: Secondary | ICD-10-CM

## 2020-10-27 ENCOUNTER — Telehealth (INDEPENDENT_AMBULATORY_CARE_PROVIDER_SITE_OTHER): Payer: 59 | Admitting: Internal Medicine

## 2020-10-27 ENCOUNTER — Encounter: Payer: Self-pay | Admitting: Internal Medicine

## 2020-10-27 DIAGNOSIS — U071 COVID-19: Secondary | ICD-10-CM | POA: Diagnosis not present

## 2020-10-27 NOTE — Assessment & Plan Note (Signed)
She is vaccinated times 2, no booster. She is having mild symptoms which are improving. Likely does not need anti-viral medication. Advised otc remedies. Counseled about 5 day quarantine and then additional 5 day mask requirement. Offered work note and she will let us know if this is needed.

## 2020-10-27 NOTE — Progress Notes (Signed)
Virtual Visit via Video Note  I connected with Ann Thornton on 10/27/20 at 10:20 AM EDT by a video enabled telemedicine application and verified that I am speaking with the correct person using two identifiers.  The patient and the provider were at separate locations throughout the entire encounter. Patient location: home, Provider location: work   I discussed the limitations of evaluation and management by telemedicine and the availability of in person appointments. The patient expressed understanding and agreed to proceed. The patient and the provider were the only parties present for the visit unless noted in HPI below.  History of Present Illness: The patient is a 45 y.o. female with visit for cough, covid-19 positive. Started with cough and fever and chills. Has started Tuesday. Home test positive yesterday. Denies SOB or body aches. The chills and fevers and cough are improving and almost gone. Just very fatigued still. Has had covid-19 vaccine times 2  Observations/Objective: Appearance: looks sick, breathing appears normal, no coughing during visit, casual grooming, abdomen does not appear distended, throat not well visualized, mental status is A and O times 3  Assessment and Plan: See problem oriented charting  Follow Up Instructions: Discussed CDC quarantine window 5 day and then 5 day with leaving home only with mask. Discussed otc remedies and expected timeline for recovery.   I discussed the assessment and treatment plan with the patient. The patient was provided an opportunity to ask questions and all were answered. The patient agreed with the plan and demonstrated an understanding of the instructions.   The patient was advised to call back or seek an in-person evaluation if the symptoms worsen or if the condition fails to improve as anticipated.  Hoyt Koch, MD

## 2020-10-31 ENCOUNTER — Encounter: Payer: Self-pay | Admitting: Internal Medicine

## 2021-01-13 ENCOUNTER — Other Ambulatory Visit: Payer: Self-pay | Admitting: Emergency Medicine

## 2021-01-13 DIAGNOSIS — I10 Essential (primary) hypertension: Secondary | ICD-10-CM

## 2021-01-17 ENCOUNTER — Ambulatory Visit: Payer: 59 | Admitting: Obstetrics and Gynecology

## 2021-01-22 NOTE — Progress Notes (Signed)
45 y.o. G0P0000 Widowed Black or [place] American Not Hispanic or Latino female here for annual exam.  She has been dating a man for 5 months. No dyspareunia. Not using contraception. H/O infertility, didn't have a complete w/u.  Period Cycle (Days): 28 Period Duration (Days): 5-7 Period Pattern: Regular Menstrual Flow: Heavy Menstrual Control: Tampon, Panty liner Menstrual Control Change Freq (Hours): 3 Dysmenorrhea: (!) Moderate Dysmenorrhea Symptoms: Cramping, Headache (She had headache before her period)  Patient's last menstrual period was 01/16/2021.          Sexually active: Yes.    The current method of family planning is none.    Exercising: No.  The patient does not participate in regular exercise at present. Smoker:  no  Health Maintenance: Pap:  06/19/16 Neg:Neg HR HPV History of abnormal Pap:  yes years ago. Follow up was normal per patient  MMG:  01/26/20 Bi-rads 1 neg, has appointment this week.   BMD:   none  Colonoscopy: none  TDaP:  01/24/15  Gardasil: none    reports that she has never smoked. She has never used smokeless tobacco. She reports current alcohol use of about 14.0 standard drinks per week. She reports that she does not use drugs. She has 1 beer a day 5 days a week and 2 days a week she has 3 beers. She works in data entry full time, part time at [employer]. Close with her nieces, nephew and step children. From [place], all of her family is here.  Past Medical History:  Diagnosis Date   Allergy    Anxiety    High blood pressure     Past Surgical History:  Procedure Laterality Date   EYE SURGERY Right 2009   Tightened muscle of eye    Current Outpatient Medications  Medication Sig Dispense Refill   amLODipine (NORVASC) 10 MG tablet Take 1 tablet (10 mg total) by mouth daily. 30 tablet 0   atenolol (TENORMIN) 50 MG tablet TAKE 1 TABLET BY MOUTH EVERY DAY 30 tablet 3   ezetimibe (ZETIA) 10 MG tablet Take 1 tablet (10 mg total) by mouth daily. 30 tablet  0   irbesartan (AVAPRO) 150 MG tablet Take 1 tablet (150 mg total) by mouth daily. 30 tablet 0   levothyroxine (SYNTHROID) 50 MCG tablet Take 1 tablet (50 mcg total) by mouth daily. 90 tablet 3   amLODipine (NORVASC) 10 MG tablet Take 1 tablet (10 mg total) by mouth daily. 90 tablet 3   betamethasone dipropionate (DIPROLENE) 0.05 % ointment Apply topically 2 (two) times daily. (Patient not taking: No sig reported)     ezetimibe (ZETIA) 10 MG tablet Take 1 tablet (10 mg total) by mouth daily. 90 tablet 3   irbesartan (AVAPRO) 150 MG tablet Take 1 tablet (150 mg total) by mouth daily. 90 tablet 3   No current facility-administered medications for this visit.    Family History  Problem Relation Age of Onset   Hypertension Mother    Stroke Mother    Thyroid disease Sister     Review of Systems  All other systems reviewed and are negative.  Exam:   BP 130/62   Pulse 77   Ht 5\' 4"  (1.626 m)   Wt 190 lb 12.8 oz (86.5 kg)   LMP 01/16/2021   SpO2 100%   BMI 32.75 kg/m   Weight change: @WEIGHTCHANGE @ Height:   Height: 5\' 4"  (162.6 cm)  Ht Readings from Last 3 Encounters:  01/23/21 5\' 4"  (1.626 m)  08/07/20 5\' 4"  (1.626 m)  01/10/20 5' 3.25" (1.607 m)    General appearance: alert, cooperative and appears stated age Head: Normocephalic, without obvious abnormality, atraumatic Neck: no adenopathy, supple, symmetrical, trachea midline and thyroid normal to inspection and palpation Lungs: clear to auscultation bilaterally Cardiovascular: regular rate and rhythm Breasts: normal appearance, no masses or tenderness Abdomen: soft, non-tender; non distended,  no masses,  no organomegaly Extremities: extremities normal, atraumatic, no cyanosis or edema Skin: Skin color, texture, turgor normal. No rashes or lesions Lymph nodes: Cervical, supraclavicular, and axillary nodes normal. No abnormal inguinal nodes palpated Neurologic: Grossly normal   Pelvic: External genitalia:  no lesions               Urethra:  normal appearing urethra with no masses, tenderness or lesions              Bartholins and Skenes: normal                 Vagina: normal appearing vagina with normal color and discharge, no lesions              Cervix: no lesions               Bimanual Exam:  Uterus:  normal size, contour, position, consistency, mobility, non-tender              Adnexa: no mass, fullness, tenderness               Rectovaginal: Confirms               Anus:  normal sphincter tone, no lesions  Gae Dry chaperoned for the exam.  1. Well woman exam Discussed breast self exam Discussed calcium and vit D intake Labs with primary Mammogram this week  2. Screening for cervical cancer - Cytology - PAP  3. Screening examination for STD (sexually transmitted disease) - RPR - HIV Antibody (routine testing w rflx) - Hepatitis C antibody - SURESWAB CT/NG/T. vaginalis  4. General counseling and advice on female contraception Discussed, they will use condoms  5. Colon cancer screening - Ambulatory referral to Gastroenterology

## 2021-01-23 ENCOUNTER — Ambulatory Visit (INDEPENDENT_AMBULATORY_CARE_PROVIDER_SITE_OTHER): Payer: 59 | Admitting: Obstetrics and Gynecology

## 2021-01-23 ENCOUNTER — Encounter: Payer: Self-pay | Admitting: Obstetrics and Gynecology

## 2021-01-23 ENCOUNTER — Other Ambulatory Visit (HOSPITAL_COMMUNITY)
Admission: RE | Admit: 2021-01-23 | Discharge: 2021-01-23 | Disposition: A | Discharge status: Home / Self Care | Payer: 59 | Source: Ambulatory Visit | Attending: Obstetrics and Gynecology | Admitting: Obstetrics and Gynecology

## 2021-01-23 ENCOUNTER — Other Ambulatory Visit: Payer: Self-pay

## 2021-01-23 VITALS — BP 130/62 | HR 77 | Ht 64.0 in | Wt 190.8 lb

## 2021-01-23 DIAGNOSIS — Z124 Encounter for screening for malignant neoplasm of cervix: Secondary | ICD-10-CM | POA: Diagnosis present

## 2021-01-23 DIAGNOSIS — Z113 Encounter for screening for infections with a predominantly sexual mode of transmission: Secondary | ICD-10-CM

## 2021-01-23 DIAGNOSIS — Z1211 Encounter for screening for malignant neoplasm of colon: Secondary | ICD-10-CM

## 2021-01-23 DIAGNOSIS — Z3009 Encounter for other general counseling and advice on contraception: Secondary | ICD-10-CM

## 2021-01-23 DIAGNOSIS — Z01419 Encounter for gynecological examination (general) (routine) without abnormal findings: Secondary | ICD-10-CM | POA: Diagnosis not present

## 2021-01-23 NOTE — Patient Instructions (Signed)

## 2021-01-24 LAB — SURESWAB CT/NG/T. VAGINALIS
C. trachomatis RNA, TMA: NOT DETECTED
N. gonorrhoeae RNA, TMA: NOT DETECTED
Trichomonas vaginalis RNA: NOT DETECTED

## 2021-01-24 LAB — HEPATITIS C ANTIBODY
Hepatitis C Ab: NONREACTIVE
SIGNAL TO CUT-OFF: 0.02 (ref <1.00)

## 2021-01-24 LAB — CYTOLOGY - PAP
Comment: NEGATIVE
Diagnosis: NEGATIVE
High risk HPV: NEGATIVE

## 2021-01-24 LAB — HIV ANTIBODY (ROUTINE TESTING W REFLEX): HIV 1&2 Ab, 4th Generation: NONREACTIVE

## 2021-01-24 LAB — RPR: RPR Ser Ql: NONREACTIVE

## 2021-02-05 ENCOUNTER — Ambulatory Visit: Payer: 59 | Admitting: Emergency Medicine

## 2021-02-05 LAB — HM MAMMOGRAPHY

## 2021-02-07 ENCOUNTER — Ambulatory Visit: Payer: 59 | Admitting: Emergency Medicine

## 2021-02-15 ENCOUNTER — Emergency Department (HOSPITAL_COMMUNITY): Payer: 59

## 2021-02-15 ENCOUNTER — Encounter (HOSPITAL_COMMUNITY): Payer: Self-pay | Admitting: Emergency Medicine

## 2021-02-15 ENCOUNTER — Ambulatory Visit: Payer: 59 | Admitting: Emergency Medicine

## 2021-02-15 ENCOUNTER — Emergency Department (HOSPITAL_COMMUNITY)
Admission: EM | Admit: 2021-02-15 | Discharge: 2021-02-15 | Disposition: A | Discharge status: Home / Self Care | Payer: 59 | Attending: Emergency Medicine | Admitting: Emergency Medicine

## 2021-02-15 DIAGNOSIS — S20212A Contusion of left front wall of thorax, initial encounter: Secondary | ICD-10-CM | POA: Diagnosis not present

## 2021-02-15 DIAGNOSIS — T1490XA Injury, unspecified, initial encounter: Secondary | ICD-10-CM

## 2021-02-15 DIAGNOSIS — Y92410 Unspecified street and highway as the place of occurrence of the external cause: Secondary | ICD-10-CM | POA: Insufficient documentation

## 2021-02-15 DIAGNOSIS — E039 Hypothyroidism, unspecified: Secondary | ICD-10-CM | POA: Diagnosis not present

## 2021-02-15 DIAGNOSIS — Z79899 Other long term (current) drug therapy: Secondary | ICD-10-CM | POA: Diagnosis not present

## 2021-02-15 DIAGNOSIS — Z8616 Personal history of COVID-19: Secondary | ICD-10-CM | POA: Insufficient documentation

## 2021-02-15 DIAGNOSIS — I10 Essential (primary) hypertension: Secondary | ICD-10-CM | POA: Insufficient documentation

## 2021-02-15 DIAGNOSIS — S4991XA Unspecified injury of right shoulder and upper arm, initial encounter: Secondary | ICD-10-CM | POA: Diagnosis not present

## 2021-02-15 DIAGNOSIS — S3992XA Unspecified injury of lower back, initial encounter: Secondary | ICD-10-CM | POA: Insufficient documentation

## 2021-02-15 DIAGNOSIS — S299XXA Unspecified injury of thorax, initial encounter: Secondary | ICD-10-CM | POA: Diagnosis present

## 2021-02-15 LAB — CBC WITH DIFFERENTIAL/PLATELET
Abs Immature Granulocytes: 0.02 10*3/uL (ref 0.00–0.07)
Basophils Absolute: 0.1 10*3/uL (ref 0.0–0.1)
Basophils Relative: 1 %
Eosinophils Absolute: 0.1 10*3/uL (ref 0.0–0.5)
Eosinophils Relative: 2 %
HCT: 36.3 % (ref 36.0–46.0)
Hemoglobin: 11.9 g/dL — ABNORMAL LOW (ref 12.0–15.0)
Immature Granulocytes: 0 %
Lymphocytes Relative: 22 %
Lymphs Abs: 1.8 10*3/uL (ref 0.7–4.0)
MCH: 31.7 pg (ref 26.0–34.0)
MCHC: 32.8 g/dL (ref 30.0–36.0)
MCV: 96.8 fL (ref 80.0–100.0)
Monocytes Absolute: 0.6 10*3/uL (ref 0.1–1.0)
Monocytes Relative: 7 %
Neutro Abs: 5.7 10*3/uL (ref 1.7–7.7)
Neutrophils Relative %: 68 %
Platelets: 333 10*3/uL (ref 150–400)
RBC: 3.75 MIL/uL — ABNORMAL LOW (ref 3.87–5.11)
RDW: 11.8 % (ref 11.5–15.5)
WBC: 8.3 10*3/uL (ref 4.0–10.5)
nRBC: 0 % (ref 0.0–0.2)

## 2021-02-15 LAB — COMPREHENSIVE METABOLIC PANEL
ALT: 13 U/L (ref 0–44)
AST: 19 U/L (ref 15–41)
Albumin: 4.1 g/dL (ref 3.5–5.0)
Alkaline Phosphatase: 47 U/L (ref 38–126)
Anion gap: 8 (ref 5–15)
BUN: 9 mg/dL (ref 6–20)
CO2: 25 mmol/L (ref 22–32)
Calcium: 9.4 mg/dL (ref 8.9–10.3)
Chloride: 103 mmol/L (ref 98–111)
Creatinine, Ser: 0.56 mg/dL (ref 0.44–1.00)
GFR, Estimated: >60 mL/min (ref >60)
Glucose, Bld: 109 mg/dL — ABNORMAL HIGH (ref 70–99)
Potassium: 3.8 mmol/L (ref 3.5–5.1)
Sodium: 136 mmol/L (ref 135–145)
Total Bilirubin: 0.4 mg/dL (ref 0.3–1.2)
Total Protein: 7.5 g/dL (ref 6.5–8.1)

## 2021-02-15 LAB — I-STAT BETA HCG BLOOD, ED (MC, WL, AP ONLY): I-stat hCG, quantitative: <5 m[IU]/mL (ref <5)

## 2021-02-15 MED ORDER — LIDOCAINE 5 % EX PTCH
1.0000 | MEDICATED_PATCH | Freq: Once | CUTANEOUS | Status: DC
  Administered 2021-02-15: 1 via TRANSDERMAL
  Filled 2021-02-15: qty 1

## 2021-02-15 MED ORDER — IOHEXOL 300 MG/ML  SOLN
100.0000 mL | Freq: Once | INTRAMUSCULAR | Status: AC | PRN
  Administered 2021-02-15: 100 mL via INTRAVENOUS

## 2021-02-15 MED ORDER — KETOROLAC TROMETHAMINE 30 MG/ML IJ SOLN
30.0000 mg | Freq: Once | INTRAMUSCULAR | Status: AC
  Administered 2021-02-15: 30 mg via INTRAVENOUS
  Filled 2021-02-15: qty 1
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
## 2021-02-15 NOTE — Discharge Instructions (Signed)
Dear Ann Thornton,  Thank you for allowing [place] to take care of you today.  We hope you begin feeling better soon.  - Please follow-up with your primary care physician or schedule an appointment to establish a primary care doctor if you do not have one already. It is important that you review any labs or imaging results (if any) obtained today with them. The preliminary imaging results and diagnosis (if any) are attached. - Please return to the Emergency Department or call 911 for chest pain, shortness of breath, severe pain, altered mental status, vision changes, or if you have any reason to think you may need emergency medical care. -May continue to use lidocaine patches to right shoulder every 24 hours as needed.  You may buy these over-the-counter at the pharmacy -Use ibuprofen or Tylenol as directed every 6-8 hours for pain -Use ice to areas as directed.  Do not place directly on skin -Follow-up closely with your PCP for recheck in the next week   Sincerely,  Cherly Hensen, DO [facility] of [facility]

## 2021-02-15 NOTE — ED Notes (Signed)
Pt provided discharge instructions and prescription information. Pt was given the opportunity to ask questions and questions were answered. Discharge signature not obtained in the setting of the COVID-19 pandemic in order to reduce high touch surfaces.  ° °
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
## 2021-02-16 ENCOUNTER — Encounter: Payer: Self-pay | Admitting: Emergency Medicine

## 2021-02-20 ENCOUNTER — Encounter: Payer: Self-pay | Admitting: Emergency Medicine

## 2021-02-22 ENCOUNTER — Encounter: Payer: Self-pay | Admitting: Emergency Medicine

## 2021-02-22 ENCOUNTER — Telehealth: Payer: Self-pay | Admitting: Hematology

## 2021-02-22 NOTE — Telephone Encounter (Signed)
Spoke to patient to confirm morning clinic appointment for 11/6, [facility] will send paperwork

## 2021-03-02 ENCOUNTER — Encounter: Payer: Self-pay | Admitting: *Deleted

## 2021-03-02 DIAGNOSIS — D0511 Intraductal carcinoma in situ of right breast: Secondary | ICD-10-CM | POA: Insufficient documentation

## 2021-03-05 ENCOUNTER — Encounter: Payer: Self-pay | Admitting: Emergency Medicine

## 2021-03-05 ENCOUNTER — Other Ambulatory Visit: Payer: Self-pay

## 2021-03-05 ENCOUNTER — Ambulatory Visit (INDEPENDENT_AMBULATORY_CARE_PROVIDER_SITE_OTHER): Payer: 59 | Admitting: Emergency Medicine

## 2021-03-05 VITALS — BP 136/70 | HR 95 | Temp 98.3°F | Ht 64.0 in | Wt 191.0 lb

## 2021-03-05 DIAGNOSIS — I10 Essential (primary) hypertension: Secondary | ICD-10-CM

## 2021-03-05 DIAGNOSIS — E039 Hypothyroidism, unspecified: Secondary | ICD-10-CM | POA: Diagnosis not present

## 2021-03-05 DIAGNOSIS — R7303 Prediabetes: Secondary | ICD-10-CM

## 2021-03-05 DIAGNOSIS — D0511 Intraductal carcinoma in situ of right breast: Secondary | ICD-10-CM | POA: Diagnosis not present

## 2021-03-05 DIAGNOSIS — E785 Hyperlipidemia, unspecified: Secondary | ICD-10-CM | POA: Diagnosis not present

## 2021-03-05 LAB — TSH: TSH: 2.12 u[IU]/mL (ref 0.35–5.50)

## 2021-03-05 LAB — LIPID PANEL
Cholesterol: 216 mg/dL — ABNORMAL HIGH (ref 0–200)
HDL: 65.4 mg/dL (ref >39.00)
LDL Cholesterol: 117 mg/dL — ABNORMAL HIGH (ref 0–99)
NonHDL: 150.3
Total CHOL/HDL Ratio: 3
Triglycerides: 168 mg/dL — ABNORMAL HIGH (ref 0.0–149.0)
VLDL: 33.6 mg/dL (ref 0.0–40.0)

## 2021-03-05 MED ORDER — LEVOTHYROXINE SODIUM 50 MCG PO TABS: 50.0000 ug | ORAL_TABLET | Freq: Every day | ORAL | 3 refills | Status: DC

## 2021-03-05 NOTE — Assessment & Plan Note (Signed)
Diet and nutrition discussed.  Continue Zetia 10 mg daily.  Lipid profile done today. The 10-year ASCVD risk score (Arnett DK, et al., 2019) is: 3.1%   Values used to calculate the score:     Age: 45 years     Sex: Female     Is Non-Hispanic African American: Yes     Diabetic: No     Tobacco smoker: No     Systolic Blood Pressure: 056 mmHg     Is BP treated: Yes     HDL Cholesterol: 50.3 mg/dL     Total Cholesterol: 168 mg/dL

## 2021-03-05 NOTE — Patient Instructions (Signed)

## 2021-03-05 NOTE — Assessment & Plan Note (Signed)
Well-controlled hypertension.  Continue amlodipine 10 mg daily, atenolol 50 mg daily, and irbesartan 150 mg daily. BP Readings from Last 3 Encounters:  03/05/21 136/70  02/15/21 120/79  01/23/21 130/62

## 2021-03-05 NOTE — Progress Notes (Signed)
Ann Thornton 45 y.o.   Chief Complaint  Patient presents with   Hypertension     F/U    HISTORY OF PRESENT ILLNESS: This is a 45 y.o. female with history of hypertension here for follow-up. Presently taking amlodipine milligrams daily, atenolol 50 mg daily, and irbesartan 150 mg daily. 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  Has appointment with oncologist in 2 days to determine further plan of action. History of hypothyroidism but off medication for 1 month.  Was taking Synthroid 50 mcg daily. History of dyslipidemia on Zetia 10 mg daily. No other complaints or medical concerns today. BP Readings from Last 3 Encounters:  03/05/21 136/70  02/15/21 120/79  01/23/21 130/62     Hypertension Pertinent negatives include no chest pain, headaches, palpitations or shortness of breath.    Prior to Admission medications   Medication Sig Start Date End Date Taking? Authorizing Provider  amLODipine (NORVASC) 10 MG tablet Take 1 tablet (10 mg total) by mouth daily. 08/11/20  Yes Horald Pollen, MD  atenolol (TENORMIN) 50 MG tablet TAKE 1 TABLET BY MOUTH EVERY DAY 01/13/21  Yes Tahirah Sara, Ines Bloomer, MD  betamethasone dipropionate (DIPROLENE) 0.05 % ointment Apply topically 2 (two) times daily. 12/23/19  Yes [provider]  ezetimibe (ZETIA) 10 MG tablet Take 1 tablet (10 mg total) by mouth daily. 08/11/20  Yes Arissa Fagin, Ines Bloomer, MD  irbesartan (AVAPRO) 150 MG tablet Take 1 tablet (150 mg total) by mouth daily. 08/11/20  Yes Zira Helinski, Ines Bloomer, MD  levothyroxine (SYNTHROID) 50 MCG tablet Take 1 tablet (50 mcg total) by mouth daily. 12/10/19  Yes Tymier Lindholm, Ines Bloomer, MD  amLODipine (NORVASC) 10 MG tablet Take 1 tablet (10 mg total) by mouth daily. 08/12/20 11/10/20  Horald Pollen, MD  ezetimibe (ZETIA) 10 MG tablet Take 1 tablet (10 mg total) by mouth daily. 08/12/20 11/10/20  Horald Pollen, MD  irbesartan (AVAPRO) 150 MG tablet Take  1 tablet (150 mg total) by mouth daily. 08/12/20 11/10/20  Horald Pollen, MD    No Known Allergies  Patient Active Problem List   Diagnosis Date Noted   Ductal carcinoma in situ (DCIS) of right breast 03/02/2021   COVID-19 10/27/2020   Hypothyroidism 08/07/2020   Dyslipidemia 12/08/2019   Prediabetes 12/08/2019   Abnormal thyroid blood test 12/08/2019   Mixed hyperlipidemia 07/07/2018   Essential hypertension 03/23/2015   Anxiety disorder 06/02/2014    Past Medical History:  Diagnosis Date   Allergy    Anxiety    High blood pressure     Past Surgical History:  Procedure Laterality Date   EYE SURGERY Right 2009   Tightened muscle of eye    Social History   Socioeconomic History   Marital status: Widowed    Spouse name: Not on file   Number of children: Not on file   Years of education: Not on file   Highest education level: Not on file  Occupational History   Occupation: customer service    Employer: [employer]  Tobacco Use   Smoking status: Never   Smokeless tobacco: Never  Vaping Use   Vaping Use: Never used  Substance and Sexual Activity   Alcohol use: Yes    Alcohol/week: 14.0 standard drinks    Types: 14 Cans of beer per week    Comment: 3 beers a day   Drug use: No   Sexual activity: Not Currently    Partners: Male  Birth control/protection: None  Other Topics Concern   Not on file  Social History Narrative   Single without children   Working from home   Exercise - zumba once a week, fitness workout on Mondays   Diet - trying to be good, eating no salt.          As of 11/08/15:      Diet: N/A   Caffeine: No Sodas   Married: Engaged   House: Lives in apartment, 2 stories, 1 person    Pets: None   Current/Past profession: Data Entry    Exercise: No    Living Will: No    DNR: No    POA/HPOA: No       Social Determinants of Radio broadcast assistant Strain: Not on file  Food Insecurity: Not on file  Transportation  Needs: Not on file  Physical Activity: Not on file  Stress: Not on file  Social Connections: Not on file  Intimate Partner Violence: Not on file    Family History  Problem Relation Age of Onset   Hypertension Mother    Stroke Mother    Thyroid disease Sister      Review of Systems  Constitutional: Negative.  Negative for chills and fever.  HENT: Negative.  Negative for congestion and sore throat.   Respiratory: Negative.  Negative for cough and shortness of breath.   Cardiovascular: Negative.  Negative for chest pain and palpitations.  Gastrointestinal: Negative.  Negative for abdominal pain, diarrhea, nausea and vomiting.  Genitourinary: Negative.  Negative for dysuria and hematuria.  Skin: Negative.  Negative for rash.  Neurological: Negative.  Negative for dizziness and headaches.  All other systems reviewed and are negative.  Today's Vitals   03/05/21 0950  BP: 136/70  Pulse: 95  Temp: 98.3 F (36.8 C)  TempSrc: Oral  SpO2: 98%  Weight: 191 lb (86.6 kg)  Height: 5\' 4"  (1.626 m)   Body mass index is 32.79 kg/m. Wt Readings from Last 3 Encounters:  03/05/21 191 lb (86.6 kg)  01/23/21 190 lb 12.8 oz (86.5 kg)  08/07/20 192 lb 9.6 oz (87.4 kg)   BP Readings from Last 3 Encounters:  03/05/21 136/70  02/15/21 120/79  01/23/21 130/62     Physical Exam Vitals reviewed.  Constitutional:      Appearance: Normal appearance.  HENT:     Head: Normocephalic.  Eyes:     Extraocular Movements: Extraocular movements intact.     Conjunctiva/sclera: Conjunctivae normal.     Pupils: Pupils are equal, round, and reactive to light.  Cardiovascular:     Rate and Rhythm: Normal rate and regular rhythm.     Pulses: Normal pulses.     Heart sounds: Normal heart sounds.  Pulmonary:     Effort: Pulmonary effort is normal.     Breath sounds: Normal breath sounds.  Musculoskeletal:        General: Normal range of motion.     Cervical back: Normal range of motion and neck  supple.     Right lower leg: No edema.     Left lower leg: No edema.  Skin:    General: Skin is warm and dry.     Capillary Refill: Capillary refill takes less than 2 seconds.  Neurological:     General: No focal deficit present.     Mental Status: She is alert and oriented to person, place, and time.  Psychiatric:        Mood and  Affect: Mood normal.        Behavior: Behavior normal.     ASSESSMENT & PLAN: Problem List Items Addressed This Visit       Cardiovascular and Mediastinum   Essential hypertension - Primary    Well-controlled hypertension.  Continue amlodipine 10 mg daily, atenolol 50 mg daily, and irbesartan 150 mg daily. BP Readings from Last 3 Encounters:  03/05/21 136/70  02/15/21 120/79  01/23/21 130/62           Endocrine   Hypothyroidism    Clinically euthyroid.  Has been off medication for 1 month. TSH drawn today.  Restart Synthroid 50 mcg daily.      Relevant Medications   levothyroxine (SYNTHROID) 50 MCG tablet   Other Relevant Orders   TSH     Other   Dyslipidemia    Diet and nutrition discussed.  Continue Zetia 10 mg daily.  Lipid profile done today. The 10-year ASCVD risk score (Arnett DK, et al., 2019) is: 3.1%   Values used to calculate the score:     Age: 82 years     Sex: Female     Is Non-Hispanic African American: Yes     Diabetic: No     Tobacco smoker: No     Systolic Blood Pressure: 096 mmHg     Is BP treated: Yes     HDL Cholesterol: 50.3 mg/dL     Total Cholesterol: 168 mg/dL       Relevant Orders   Lipid panel   Prediabetes    Diet and nutrition discussed.  Patient eating better.  Drinking a lot less.      Ductal carcinoma in situ (DCIS) of right breast    Oncology appointment 03/07/2021 to determine further plan of action.      Patient Instructions  Hypertension, Adult High blood pressure (hypertension) is when the force of blood pumping through the arteries is too strong. The arteries are the blood vessels  that carry blood from the heart throughout the body. Hypertension forces the heart to work harder to pump blood and may cause arteries to become narrow or stiff. Untreated or uncontrolled hypertension can cause a heart attack, heart failure, a stroke, kidney disease, and other problems. A blood pressure reading consists of a higher number over a lower number. Ideally, your blood pressure should be below 120/80. The first ("top") number is called the systolic pressure. It is a measure of the pressure in your arteries as your heart beats. The second ("bottom") number is called the diastolic pressure. It is a measure of the pressure in your arteries as the heart relaxes. What are the causes? The exact cause of this condition is not known. There are some conditions that result in or are related to high blood pressure. What increases the risk? Some risk factors for high blood pressure are under your control. The following factors may make you more likely to develop this condition: Smoking. Having type 2 diabetes mellitus, high cholesterol, or both. Not getting enough exercise or physical activity. Being overweight. Having too much fat, sugar, calories, or salt (sodium) in your diet. Drinking too much alcohol. Some risk factors for high blood pressure may be difficult or impossible to change. Some of these factors include: Having chronic kidney disease. Having a family history of high blood pressure. Age. Risk increases with age. Race. You may be at higher risk if you are African American. Gender. Men are at higher risk than women before age 74. After  age 26, women are at higher risk than men. Having obstructive sleep apnea. Stress. What are the signs or symptoms? High blood pressure may not cause symptoms. Very high blood pressure (hypertensive crisis) may cause: Headache. Anxiety. Shortness of breath. Nosebleed. Nausea and vomiting. Vision changes. Severe chest pain. Seizures. How is this  diagnosed? This condition is diagnosed by measuring your blood pressure while you are seated, with your arm resting on a flat surface, your legs uncrossed, and your feet flat on the floor. The cuff of the blood pressure monitor will be placed directly against the skin of your upper arm at the level of your heart. It should be measured at least twice using the same arm. Certain conditions can cause a difference in blood pressure between your right and left arms. Certain factors can cause blood pressure readings to be lower or higher than normal for a short period of time: When your blood pressure is higher when you are in a health care provider's office than when you are at home, this is called white coat hypertension. Most people with this condition do not need medicines. When your blood pressure is higher at home than when you are in a health care provider's office, this is called masked hypertension. Most people with this condition may need medicines to control blood pressure. If you have a high blood pressure reading during one visit or you have normal blood pressure with other risk factors, you may be asked to: Return on a different day to have your blood pressure checked again. Monitor your blood pressure at home for 1 week or longer. If you are diagnosed with hypertension, you may have other blood or imaging tests to help your health care provider understand your overall risk for other conditions. How is this treated? This condition is treated by making healthy lifestyle changes, such as eating healthy foods, exercising more, and reducing your alcohol intake. Your health care provider may prescribe medicine if lifestyle changes are not enough to get your blood pressure under control, and if: Your systolic blood pressure is above 130. Your diastolic blood pressure is above 80. Your personal target blood pressure may vary depending on your medical conditions, your age, and other factors. Follow  these instructions at home: Eating and drinking  Eat a diet that is high in fiber and potassium, and low in sodium, added sugar, and fat. An example eating plan is called the DASH (Dietary Approaches to Stop Hypertension) diet. To eat this way: Eat plenty of fresh fruits and vegetables. Try to fill one half of your plate at each meal with fruits and vegetables. Eat whole grains, such as whole-wheat pasta, brown rice, or whole-grain bread. Fill about one fourth of your plate with whole grains. Eat or drink low-fat dairy products, such as skim milk or low-fat yogurt. Avoid fatty cuts of meat, processed or cured meats, and poultry with skin. Fill about one fourth of your plate with lean proteins, such as fish, chicken without skin, beans, eggs, or tofu. Avoid pre-made and processed foods. These tend to be higher in sodium, added sugar, and fat. Reduce your daily sodium intake. Most people with hypertension should eat less than 1,500 mg of sodium a day. Do not drink alcohol if: Your health care provider tells you not to drink. You are pregnant, may be pregnant, or are planning to become pregnant. If you drink alcohol: Limit how much you use to: 0-1 drink a day for women. 0-2 drinks a day for men.  Be aware of how much alcohol is in your drink. In the [place] one drink equals one 12 oz bottle of beer (355 mL), one 5 oz glass of wine (148 mL), or one 1 oz glass of hard liquor (44 mL). Lifestyle  Work with your health care provider to maintain a healthy body weight or to lose weight. Ask what an ideal weight is for you. Get at least 30 minutes of exercise most days of the week. Activities may include walking, swimming, or biking. Include exercise to strengthen your muscles (resistance exercise), such as Pilates or lifting weights, as part of your weekly exercise routine. Try to do these types of exercises for 30 minutes at least 3 days a week. Do not use any products that contain nicotine or tobacco,  such as cigarettes, e-cigarettes, and chewing tobacco. If you need help quitting, ask your health care provider. Monitor your blood pressure at home as told by your health care provider. Keep all follow-up visits as told by your health care provider. This is important. Medicines Take over-the-counter and prescription medicines only as told by your health care provider. Follow directions carefully. Blood pressure medicines must be taken as prescribed. Do not skip doses of blood pressure medicine. Doing this puts you at risk for problems and can make the medicine less effective. Ask your health care provider about side effects or reactions to medicines that you should watch for. Contact a health care provider if you: Think you are having a reaction to a medicine you are taking. Have headaches that keep coming back (recurring). Feel dizzy. Have swelling in your ankles. Have trouble with your vision. Get help right away if you: Develop a severe headache or confusion. Have unusual weakness or numbness. Feel faint. Have severe pain in your chest or abdomen. Vomit repeatedly. Have trouble breathing. Summary Hypertension is when the force of blood pumping through your arteries is too strong. If this condition is not controlled, it may put you at risk for serious complications. Your personal target blood pressure may vary depending on your medical conditions, your age, and other factors. For most people, a normal blood pressure is less than 120/80. Hypertension is treated with lifestyle changes, medicines, or a combination of both. Lifestyle changes include losing weight, eating a healthy, low-sodium diet, exercising more, and limiting alcohol. This information is not intended to replace advice given to you by your health care provider. Make sure you discuss any questions you have with your health care provider. Document Revised: 12/17/2017 Document Reviewed: 12/17/2017 Elsevier Patient Education   2022 [place], [place] [facility]

## 2021-03-05 NOTE — Assessment & Plan Note (Signed)
Oncology appointment 03/07/2021 to determine further plan of action.

## 2021-03-05 NOTE — Assessment & Plan Note (Signed)
Diet and nutrition discussed.  Patient eating better.  Drinking a lot less.

## 2021-03-05 NOTE — Assessment & Plan Note (Signed)
Clinically euthyroid.  Has been off medication for 1 month. TSH drawn today.  Restart Synthroid 50 mcg daily.
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
## 2021-03-07 ENCOUNTER — Encounter: Payer: Self-pay | Admitting: *Deleted

## 2021-03-07 ENCOUNTER — Ambulatory Visit
Admission: RE | Admit: 2021-03-07 | Discharge: 2021-03-07 | Disposition: A | Discharge status: Home / Self Care | Payer: 59 | Source: Ambulatory Visit | Attending: Radiation Oncology | Admitting: Radiation Oncology

## 2021-03-07 ENCOUNTER — Encounter: Payer: Self-pay | Admitting: Emergency Medicine

## 2021-03-07 ENCOUNTER — Ambulatory Visit: Payer: 59 | Admitting: Physical Therapy

## 2021-03-07 ENCOUNTER — Inpatient Hospital Stay: Payer: 59 | Attending: Hematology

## 2021-03-07 ENCOUNTER — Ambulatory Visit (HOSPITAL_BASED_OUTPATIENT_CLINIC_OR_DEPARTMENT_OTHER): Payer: 59 | Admitting: Genetic Counselor

## 2021-03-07 ENCOUNTER — Inpatient Hospital Stay (HOSPITAL_BASED_OUTPATIENT_CLINIC_OR_DEPARTMENT_OTHER): Payer: 59 | Admitting: Hematology

## 2021-03-07 ENCOUNTER — Encounter: Payer: Self-pay | Admitting: Hematology

## 2021-03-07 ENCOUNTER — Other Ambulatory Visit: Payer: Self-pay

## 2021-03-07 VITALS — BP 116/59 | HR 90 | Temp 97.9°F | Resp 18 | Ht 64.0 in | Wt 191.9 lb

## 2021-03-07 DIAGNOSIS — I10 Essential (primary) hypertension: Secondary | ICD-10-CM | POA: Insufficient documentation

## 2021-03-07 DIAGNOSIS — F1290 Cannabis use, unspecified, uncomplicated: Secondary | ICD-10-CM

## 2021-03-07 DIAGNOSIS — Z170 Estrogen receptor positive status [ER+]: Secondary | ICD-10-CM

## 2021-03-07 DIAGNOSIS — D0511 Intraductal carcinoma in situ of right breast: Secondary | ICD-10-CM | POA: Diagnosis present

## 2021-03-07 DIAGNOSIS — E079 Disorder of thyroid, unspecified: Secondary | ICD-10-CM | POA: Diagnosis not present

## 2021-03-07 LAB — CBC WITH DIFFERENTIAL (CANCER CENTER ONLY)
Abs Immature Granulocytes: 0.02 10*3/uL (ref 0.00–0.07)
Basophils Absolute: 0 10*3/uL (ref 0.0–0.1)
Basophils Relative: 1 %
Eosinophils Absolute: 0.1 10*3/uL (ref 0.0–0.5)
Eosinophils Relative: 2 %
HCT: 32.6 % — ABNORMAL LOW (ref 36.0–46.0)
Hemoglobin: 11.2 g/dL — ABNORMAL LOW (ref 12.0–15.0)
Immature Granulocytes: 0 %
Lymphocytes Relative: 31 %
Lymphs Abs: 1.8 10*3/uL (ref 0.7–4.0)
MCH: 32.7 pg (ref 26.0–34.0)
MCHC: 34.4 g/dL (ref 30.0–36.0)
MCV: 95.3 fL (ref 80.0–100.0)
Monocytes Absolute: 0.4 10*3/uL (ref 0.1–1.0)
Monocytes Relative: 7 %
Neutro Abs: 3.5 10*3/uL (ref 1.7–7.7)
Neutrophils Relative %: 59 %
Platelet Count: 357 10*3/uL (ref 150–400)
RBC: 3.42 MIL/uL — ABNORMAL LOW (ref 3.87–5.11)
RDW: 11.7 % (ref 11.5–15.5)
WBC Count: 5.9 10*3/uL (ref 4.0–10.5)
nRBC: 0 % (ref 0.0–0.2)

## 2021-03-07 LAB — CMP (CANCER CENTER ONLY)
ALT: 15 U/L (ref 0–44)
AST: 16 U/L (ref 15–41)
Albumin: 4.2 g/dL (ref 3.5–5.0)
Alkaline Phosphatase: 48 U/L (ref 38–126)
Anion gap: 10 (ref 5–15)
BUN: 12 mg/dL (ref 6–20)
CO2: 24 mmol/L (ref 22–32)
Calcium: 9.1 mg/dL (ref 8.9–10.3)
Chloride: 104 mmol/L (ref 98–111)
Creatinine: 0.72 mg/dL (ref 0.44–1.00)
GFR, Estimated: >60 mL/min (ref >60)
Glucose, Bld: 110 mg/dL — ABNORMAL HIGH (ref 70–99)
Potassium: 3.7 mmol/L (ref 3.5–5.1)
Sodium: 138 mmol/L (ref 135–145)
Total Bilirubin: 0.4 mg/dL (ref 0.3–1.2)
Total Protein: 7.8 g/dL (ref 6.5–8.1)

## 2021-03-07 LAB — GENETIC SCREENING ORDER
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
## 2021-03-07 NOTE — Progress Notes (Signed)
[place] [facility]  Initial Assessment   Ann Thornton is a 45 y.o. year old female accompanied by boyfriend. Clinical Social Work was referred by [facility] for assessment of psychosocial needs.   SDOH (Social Determinants of Health) assessments performed: Yes SDOH Interventions    Flowsheet Row Most Recent Value  SDOH Interventions   Food Insecurity Interventions Intervention Not Indicated  Financial Strain Interventions Intervention Not Indicated  Housing Interventions Intervention Not Indicated  Transportation Interventions Intervention Not Indicated       Distress Screen completed: Yes ONCBCN DISTRESS SCREENING 03/07/2021  Screening Type Initial Screening  Distress experienced in past week (1-10) 5  Emotional problem type Nervousness/Anxiety      Family/Social Information:  Housing Arrangement: patient lives with boyfriend Ann Thornton and pt's mother. Family members/support persons in your life? Large family lives locally, aunts, uncles, sisters, brothers Transportation concerns: no  Employment: Working full time for [employer], working from home. Income source: Employment Financial concerns: No Type of concern: None Food access concerns: no Religious or spiritual practice: yes Medication Concerns: no  Services Currently in place:  n/a  Coping/ Adjustment to diagnosis: Patient understands treatment plan and what happens next? yes Concerns about diagnosis and/or treatment: I'm not especially worried about anything Patient reported stressors: Anxiety and Adjusting to my illness Hopes and priorities: to begin attending church again, just started a new position at work, keep up my diet. Patient enjoys  going out with my boyfriend, going out to eat, going to the beach, going to [place] every week to see boyfriend Ann Thornton's grandchild. Current coping skills/ strengths: Motivation for treatment/growth , Religious Affiliation , and Supportive family/friends      SUMMARY: Current SDOH Barriers:  None identified at this time.   Interventions: Discussed common feeling and emotions when being diagnosed with cancer, and the importance of support during treatment Informed patient of the support team roles and support services at [facility] Provided CSW contact information and encouraged patient to call with any questions or concerns   Follow Up Plan: Patient will follow up with CSW with any needs or concerns.   Patient verbalizes understanding of plan: Yes   Ann Thornton, Social Work Intern Ann Thornton , LCSW

## 2021-03-07 NOTE — Research (Signed)
AFT - 25: COMPARING AN OPERATION TO MONITORING, WITH OR WITHOUT ENDOCRINE THERAPY (COMET) FOR LOW RISK DCIS: A PHASE III PROSPECTIVE RANDOMIZED TRIAL  03/07/21  Study Introduction: Patient Ann Thornton was identified by Dr. Burr Medico as a potential candidate for the above listed study.  This Clinical Research Coordinator and Clinical Research Nurse, Vickii Penna, met with Ann Thornton, MRN [id number], on 03/07/21 in a manner and location that ensures patient privacy to discuss participation in the above listed research study.  Patient is Unaccompanied.  A copy of the informed consent document with embedded HIPAA language was provided to the patient.  Patient reads, speaks, and understands [place].    Patient was provided with the business card of this Coordinator and encouraged to contact the research team with any questions.  Approximately 20 minutes were spent with the patient reviewing the informed consent documents.  Patient was provided the option of taking informed consent documents home to review and was encouraged to review at their convenience with their support network, including other care providers. Patient took the consent documents home to review.  Plan: Will follow up with patient via phone next week concerning her interest in participating in this trial.  The patient was asked to call with any questions in the meantime.  Clabe Seal Clinical Research Coordinator I  03/07/21 11:42 AM

## 2021-03-09 ENCOUNTER — Encounter: Payer: Self-pay | Admitting: Genetic Counselor

## 2021-03-09 NOTE — Progress Notes (Signed)
REFERRING PROVIDER: Truitt Merle, MD [place],  [place] [address]  PRIMARY PROVIDER:  Horald Pollen, MD  PRIMARY REASON FOR VISIT:  1. Ductal carcinoma in situ (DCIS) of right breast     HISTORY OF PRESENT ILLNESS:   Ms. Ridlon, a 45 y.o. female, was seen for a [facility] cancer genetics consultation during the breast multidisciplinary clinic at the request of Dr. Burr Medico due to a personal history of DCIS.  Ms. Swing presents to clinic today to discuss the possibility of a hereditary predisposition to cancer, to discuss genetic testing, and to further clarify her future cancer risks, as well as potential cancer risks for family members.   In November 2022, at the age of 32, Ms. Guyett was diagnosed with ductal carcinoma in situ of the right breast (ER+/PR+). The treatment plan is pending.  She is considering breast conserving surgery v COMET trial.   CANCER HISTORY:  Oncology History Overview Note  Cancer Staging Ductal carcinoma in situ (DCIS) of right breast Staging form: Breast, AJCC 8th Edition - Clinical: Stage 0 (cTis (DCIS), cN0, cM0, ER+, PR+, HER2: Not Assessed) - Signed by Truitt Merle, MD on 03/06/2021    Ductal carcinoma in situ (DCIS) of right breast  02/05/2021 Mammogram   Exam: 3D Mammogram Diagnostic Unilateral Digital - Right  IMPRESSION: The 1.1 cm grouped amorphous round calcifications in the right breast are suspicious.   02/20/2021 Pathology Results   Diagnosis Breast, right, needle core biopsy, right breast calcification 9:00 middle depth 8cmfn - DUCTAL CARCINOMA IN SITU WITH CALCIFICATIONS Microscopic Comment Based on the biopsy, the ductal carcinoma in situ has a cribriform pattern, intermediate nuclear grade and measured 0.3 cm in greatest linear extent.  PROGNOSTIC INDICATORS Results: Estrogen Receptor: >95%, POSITIVE, STRONG STAINING INTENSITY Progesterone Receptor: >95%, POSITIVE, STRONG STAINING INTENSITY    03/02/2021  Initial Diagnosis   Ductal carcinoma in situ (DCIS) of right breast   03/06/2021 Cancer Staging   Staging form: Breast, AJCC 8th Edition - Clinical: Stage 0 (cTis (DCIS), cN0, cM0, ER+, PR+, HER2: Not Assessed) - Signed by Truitt Merle, MD on 03/06/2021 Stage prefix: Initial diagnosis      RISK FACTORS:  Menarche was at age 30.  Nulliparous.  OCP use for approximately 0 years.  Ovaries intact: yes.  Hysterectomy: no.  Menopausal status: premenopausal.  HRT use: 0 years. Colonoscopy: no; not examined. Mammogram within the last year: yes. Up to date with pelvic exams: yes; most recent PAP in 2022  Past Medical History:  Diagnosis Date   Anxiety    High blood pressure    Thyroid disease     Past Surgical History:  Procedure Laterality Date   EYE SURGERY Right 2009   Tightened muscle of eye    FAMILY HISTORY:  We obtained a detailed, 4-generation family history.  Significant diagnoses are listed below: Family History  Problem Relation Age of Onset   Hypertension Mother    Stroke Mother    Thyroid disease Sister    Lung cancer Maternal Uncle        dx after 65; x2 maternal uncles   Bone cancer Maternal Grandmother        dx after 46    Ms. Cleaver is unaware of previous family history of genetic testing for hereditary cancer risks. There is no reported Ashkenazi Jewish ancestry. There is no known consanguinity.  GENETIC COUNSELING ASSESSMENT: Ms. Piercey is a 45 y.o. female with a personal history of cancer which is somewhat  suggestive of a hereditary cancer syndrome and predisposition to cancer given her age of diagnosis. We, therefore, discussed and recommended the following at today's visit.   DISCUSSION: We discussed that 5 - 10% of cancer is hereditary, with most cases of hereditary breast cancer associated with mutations in BRCA1/2.  There are other genes that can be associated with hereditary breast cancer syndromes.  Type of cancer risk and level of risk are  gene-specific. We discussed that testing is beneficial for several reasons including knowing how to follow individuals after completing their treatment, identifying whether potential treatment options would be beneficial, and understanding if other family members could be at risk for cancer and allowing them to undergo genetic testing.   We reviewed the characteristics, features and inheritance patterns of hereditary cancer syndromes. We also discussed genetic testing, including the appropriate family members to test, the process of testing, insurance coverage and turn-around-time for results. We discussed the implications of a negative, positive and/or variant of uncertain significant result. In order to get genetic test results in a timely manner so that Ms. Abid can use these genetic test results for surgical decisions, we recommended Ms. Gunderson pursue genetic testing for the [facility] BRCAPlus.  The BRCAplus panel offered by [employer] and includes sequencing and deletion/duplication analysis for the following 8 genes: ATM, BRCA1, BRCA2, CDH1, CHEK2, PALB2, PTEN, and TP53.  Once complete, we recommend Ms. Hacking pursue reflex genetic testing to a more comprehensive gene panel.   Ms. Ostrander  was offered a common hereditary cancer panel (47 genes) and an expanded pan-cancer panel (77 genes). Ms. Longstreth was informed of the benefits and limitations of each panel, including that expanded pan-cancer panels contain genes that do not have clear management guidelines at this point in time.  We also discussed that as the number of genes included on a panel increases, the chances of variants of uncertain significance increases.  After considering the benefits and limitations of each gene panel, Ms. Verbeek  elected to have an expanded pan-cancer panel through [place].  The CancerNext-Expanded gene panel offered by [facility] and includes sequencing, rearrangement, and RNA analysis for the following 77 genes:  AIP, ALK, APC, ATM, AXIN2, BAP1, BARD1, BLM, BMPR1A, BRCA1, BRCA2, BRIP1, CDC73, CDH1, CDK4, CDKN1B, CDKN2A, CHEK2, CTNNA1, DICER1, FANCC, FH, FLCN, GALNT12, KIF1B, LZTR1, MAX, MEN1, MET, MLH1, MSH2, MSH3, MSH6, MUTYH, NBN, NF1, NF2, NTHL1, PALB2, PHOX2B, PMS2, POT1, PRKAR1A, PTCH1, PTEN, RAD51C, RAD51D, RB1, RECQL, RET, SDHA, SDHAF2, SDHB, SDHC, SDHD, SMAD4, SMARCA4, SMARCB1, SMARCE1, STK11, SUFU, TMEM127, TP53, TSC1, TSC2, VHL and XRCC2 (sequencing and deletion/duplication); EGFR, EGLN1, HOXB13, KIT, MITF, PDGFRA, POLD1, and POLE (sequencing only); EPCAM and GREM1 (deletion/duplication only).   Based on Ms. Mcgloin personal history of cancer, she meets medical criteria for genetic testing. Despite that she meets criteria, she may still have an out of pocket cost. We discussed that if her out of pocket cost for testing is over $100, the laboratory should contact them to discuss self-pay prices, patient pay assistance programs, if applicable, and other billing options.   PLAN: After considering the risks, benefits, and limitations, Ms. Shinault provided informed consent to pursue genetic testing and the blood sample was sent to [facility] for analysis of the BRCAPlus and CancerNext-Expanded +RNAinsight Panel. Results should be available within approximately 1-2 weeks' time, at which point they will be disclosed by telephone to Ms. Klepacki, as will any additional recommendations warranted by these results. Ms. Allaire will receive a summary of her genetic counseling  visit and a copy of her results once available. This information will also be available in Epic.   Lastly, we encouraged Ms. Odenthal to remain in contact with cancer genetics annually so that we can continuously update the family history and inform her of any changes in cancer genetics and testing that may be of benefit for this family.   Ms. Ocampo questions were answered to her satisfaction today. Our contact information was provided  should additional questions or concerns arise. Thank you for the referral and allowing [place] to share in the care of your patient.   Ritesh Opara M. Joette Catching, [place], [facility] Genetic Counselor [email or web address] (P) [phone]  The patient was seen for a total of 20 minutes in face-to-face genetic counseling.  The patient was accompanied by her significant other. Drs. Magrinat, Lindi Adie and/or Burr Medico were available to discuss this case as needed.  _______________________________________________________________________ For Office Staff:  Number of people involved in session:  Was an Intern/ student involved with case: no

## 2021-03-12 ENCOUNTER — Telehealth: Payer: Self-pay

## 2021-03-12 NOTE — Telephone Encounter (Addendum)
AFT - 25: COMPARING AN OPERATION TO MONITORING, WITH OR WITHOUT ENDOCRINE THERAPY (COMET) FOR LOW RISK DCIS: A PHASE III PROSPECTIVE RANDOMIZED TRIAL  Called and spoke with patient to follow-up on her interest in the COMET trial. Patient stated that she had reviewed the consent documents and had no questions at this time. Patient stated that she was still interested in the trial, but wasn't ready to make a final decision quite yet. This research nurse provided patient with her contact information and told her that we would follow-up again next week. Patient thanked for her time and encouraged to call with any questions or concerns in the meantime.   Vickii Penna, RN, BSN, CPN Clinical Research Nurse I [phone]  03/12/2021 10:23 AM

## 2021-03-14 ENCOUNTER — Encounter: Payer: Self-pay | Admitting: *Deleted

## 2021-03-14 ENCOUNTER — Telehealth: Payer: Self-pay | Admitting: *Deleted

## 2021-03-14 NOTE — Telephone Encounter (Signed)
Spoke with patient to follow up from [facility] and assess navigation needs. Patient denies any questions or concerns at this time.  She is still waiting on genetics before making her decision regarding the COMET trial. She states she will call me when she has made a decision.

## 2021-03-20 ENCOUNTER — Ambulatory Visit: Payer: Self-pay | Admitting: Genetic Counselor

## 2021-03-20 ENCOUNTER — Telehealth: Payer: Self-pay | Admitting: Genetic Counselor

## 2021-03-20 ENCOUNTER — Encounter: Payer: Self-pay | Admitting: Genetic Counselor

## 2021-03-20 DIAGNOSIS — Z1379 Encounter for other screening for genetic and chromosomal anomalies: Secondary | ICD-10-CM | POA: Insufficient documentation

## 2021-03-20 DIAGNOSIS — D0511 Intraductal carcinoma in situ of right breast: Secondary | ICD-10-CM

## 2021-03-20 NOTE — Telephone Encounter (Signed)
Revealed negative genetic testing.  Discussed that we do not know why she has breast cancer. It could be familial, due to a different gene that we are not testing, or maybe our current technology may not be able to pick something up.  It will be important for her to keep in contact with genetics to keep up with whether additional testing may be needed.

## 2021-03-20 NOTE — Progress Notes (Signed)
HPI:   Ann Thornton was previously seen in the [place] clinic due to a personal history of ductal carcinoma in situ and concerns regarding a hereditary predisposition to cancer. Please refer to our prior cancer genetics clinic note for more information regarding our discussion, assessment and recommendations, at the time. Ann Thornton recent genetic test results were disclosed to her, as were recommendations warranted by these results. These results and recommendations are discussed in more detail below.  CANCER HISTORY:  Oncology History Overview Note  Cancer Staging Ductal carcinoma in situ (DCIS) of right breast Staging form: Breast, AJCC 8th Edition - Clinical: Stage 0 (cTis (DCIS), cN0, cM0, ER+, PR+, HER2: Not Assessed) - Signed by Truitt Merle, MD on 03/06/2021    Ductal carcinoma in situ (DCIS) of right breast  02/05/2021 Mammogram   Exam: 3D Mammogram Diagnostic Unilateral Digital - Right  IMPRESSION: The 1.1 cm grouped amorphous round calcifications in the right breast are suspicious.   02/20/2021 Pathology Results   Diagnosis Breast, right, needle core biopsy, right breast calcification 9:00 middle depth 8cmfn - DUCTAL CARCINOMA IN SITU WITH CALCIFICATIONS Microscopic Comment Based on the biopsy, the ductal carcinoma in situ has a cribriform pattern, intermediate nuclear grade and measured 0.3 cm in greatest linear extent.  PROGNOSTIC INDICATORS Results: Estrogen Receptor: >95%, POSITIVE, STRONG STAINING INTENSITY Progesterone Receptor: >95%, POSITIVE, STRONG STAINING INTENSITY    03/02/2021 Initial Diagnosis   Ductal carcinoma in situ (DCIS) of right breast   03/06/2021 Cancer Staging   Staging form: Breast, AJCC 8th Edition - Clinical: Stage 0 (cTis (DCIS), cN0, cM0, ER+, PR+, HER2: Not Assessed) - Signed by Truitt Merle, MD on 03/06/2021 Stage prefix: Initial diagnosis    03/20/2021 Genetic Testing   Negative hereditary cancer genetic testing: no  pathogenic variants detected in [facility] CancerNext-Expanded +RNAinsight Panel.  The report date is 03/20/2021.   The CancerNext-Expanded gene panel offered by [facility] and includes sequencing, rearrangement, and RNA analysis for the following 77 genes: AIP, ALK, APC, ATM, AXIN2, BAP1, BARD1, BLM, BMPR1A, BRCA1, BRCA2, BRIP1, CDC73, CDH1, CDK4, CDKN1B, CDKN2A, CHEK2, CTNNA1, DICER1, FANCC, FH, FLCN, GALNT12, KIF1B, LZTR1, MAX, MEN1, MET, MLH1, MSH2, MSH3, MSH6, MUTYH, NBN, NF1, NF2, NTHL1, PALB2, PHOX2B, PMS2, POT1, PRKAR1A, PTCH1, PTEN, RAD51C, RAD51D, RB1, RECQL, RET, SDHA, SDHAF2, SDHB, SDHC, SDHD, SMAD4, SMARCA4, SMARCB1, SMARCE1, STK11, SUFU, TMEM127, TP53, TSC1, TSC2, VHL and XRCC2 (sequencing and deletion/duplication); EGFR, EGLN1, HOXB13, KIT, MITF, PDGFRA, POLD1, and POLE (sequencing only); EPCAM and GREM1 (deletion/duplication only).      FAMILY HISTORY:  We obtained a detailed, 4-generation family history.  Significant diagnoses are listed below: Family History  Problem Relation Age of Onset   Lung cancer Maternal Uncle        dx after 68; x2 maternal uncles   Bone cancer Maternal Grandmother        dx after 70     Ann Thornton is unaware of previous family history of genetic testing for hereditary cancer risks. There is no reported Ashkenazi Jewish ancestry. There is no known consanguinity.    GENETIC TEST RESULTS:  The [facility] CancerNext-Expanded +RNAinsight Panel found no pathogenic mutations. The CancerNext-Expanded gene panel offered by [facility] and includes sequencing, rearrangement, and RNA analysis for the following 77 genes: AIP, ALK, APC, ATM, AXIN2, BAP1, BARD1, BLM, BMPR1A, BRCA1, BRCA2, BRIP1, CDC73, CDH1, CDK4, CDKN1B, CDKN2A, CHEK2, CTNNA1, DICER1, FANCC, FH, FLCN, GALNT12, KIF1B, LZTR1, MAX, MEN1, MET, MLH1, MSH2, MSH3, MSH6, MUTYH, NBN, NF1, NF2, NTHL1, PALB2, PHOX2B,  PMS2, POT1, PRKAR1A, PTCH1, PTEN, RAD51C, RAD51D, RB1, RECQL, RET, SDHA, SDHAF2, SDHB, SDHC,  SDHD, SMAD4, SMARCA4, SMARCB1, SMARCE1, STK11, SUFU, TMEM127, TP53, TSC1, TSC2, VHL and XRCC2 (sequencing and deletion/duplication); EGFR, EGLN1, HOXB13, KIT, MITF, PDGFRA, POLD1, and POLE (sequencing only); EPCAM and GREM1 (deletion/duplication only).   The test report has been scanned into EPIC and is located under the Molecular Pathology section of the Results Review tab.  A portion of the result report is included below for reference. Genetic testing reported out on 03/20/2021.    Even though a pathogenic variant was not identified, possible explanations for the cancer in the family may include: There may be no hereditary risk for cancer in the family. The cancers in Ann Thornton and/or her family may be sporadic/familial or due to other genetic and environmental factors. There may be a gene mutation in one of these genes that current testing methods cannot detect but that chance is small. There could be another gene that has not yet been discovered, or that we have not yet tested, that is responsible for the cancer diagnoses in the family.   Therefore, it is important to remain in touch with cancer genetics in the future so that we can continue to offer Ann Thornton the most up to date genetic testing.     ADDITIONAL GENETIC TESTING:  We discussed with Ann Thornton that her genetic testing was fairly extensive.  If there are other relevant genes identified to increase cancer risk that can be analyzed in the future, we would be happy to discuss and coordinate this testing at that time.    CANCER SCREENING RECOMMENDATIONS:  Ann Thornton test result is considered negative (normal).  This means that we have not identified a hereditary cause for her personal history of DCIS at this time.   An individual's cancer risk and medical management are not determined by genetic test results alone. Overall cancer risk assessment incorporates additional factors, including personal medical history, family  history, and any available genetic information that may result in a personalized plan for cancer prevention and surveillance. Therefore, it is recommended she continue to follow the cancer management and screening guidelines provided by her oncology and primary healthcare provider.  RECOMMENDATIONS FOR FAMILY MEMBERS:   Since she did not inherit a identifiable mutation in a cancer predisposition gene included on this panel, her children could not have inherited a known mutation from her in one of these genes. Individuals in this family might be at some increased risk of developing cancer, over the general population risk, due to the family history of cancer.  Individuals in the family should notify their providers of the family history of cancer. We recommend women in this family have a yearly mammogram beginning at age 68, or 103 years younger than the earliest onset of cancer, an annual clinical breast exam, and perform monthly breast self-exams.   FOLLOW-UP:  Lastly, we discussed with Ann Thornton that cancer genetics is a rapidly advancing field and it is possible that new genetic tests will be appropriate for her and/or her family members in the future. We encouraged her to remain in contact with cancer genetics on an annual basis so we can update her personal and family histories and let her know of advances in cancer genetics that may benefit this family.   Our contact number was provided. Ann Thornton questions were answered to her satisfaction, and she knows she is welcome to call us at anytime with additional questions or concerns.  Seema Blum M. Joette Catching, [place], [facility] Genetic Counselor [email or web address] (P) [phone]

## 2021-03-20 NOTE — Telephone Encounter (Signed)
Called Ann Thornton to notify her that genetic testing results are delayed.  Expected to result out in 3-5 days.  Will call patient to disclose results when available.

## 2021-03-21 ENCOUNTER — Telehealth: Payer: Self-pay

## 2021-03-21 NOTE — Telephone Encounter (Signed)
AFT - 25: COMPARING AN OPERATION TO MONITORING, WITH OR WITHOUT ENDOCRINE THERAPY (COMET) FOR LOW RISK DCIS: A PHASE III PROSPECTIVE RANDOMIZED TRIAL  Called to follow-up with patient about interest in COMET trial. Patient stated that she has decided against participating in the trial and wants to pursue surgery and radiation at this time. Patient thanked for her time and encouraged to call with any questions or concerns. MD made aware.

## 2021-03-22 ENCOUNTER — Encounter: Payer: Self-pay | Admitting: *Deleted

## 2021-03-23 ENCOUNTER — Other Ambulatory Visit: Payer: Self-pay | Admitting: General Surgery

## 2021-03-23 DIAGNOSIS — D0511 Intraductal carcinoma in situ of right breast: Secondary | ICD-10-CM

## 2021-03-26 ENCOUNTER — Encounter: Payer: Self-pay | Admitting: *Deleted

## 2021-03-26 DIAGNOSIS — D0511 Intraductal carcinoma in situ of right breast: Secondary | ICD-10-CM

## 2021-03-28 ENCOUNTER — Other Ambulatory Visit: Payer: Self-pay

## 2021-04-02 ENCOUNTER — Other Ambulatory Visit: Payer: Self-pay

## 2021-04-02 ENCOUNTER — Encounter (HOSPITAL_BASED_OUTPATIENT_CLINIC_OR_DEPARTMENT_OTHER): Payer: Self-pay | Admitting: General Surgery

## 2021-04-05 ENCOUNTER — Encounter (HOSPITAL_BASED_OUTPATIENT_CLINIC_OR_DEPARTMENT_OTHER)
Admission: RE | Admit: 2021-04-05 | Discharge: 2021-04-05 | Disposition: A | Discharge status: Home / Self Care | Payer: 59 | Source: Ambulatory Visit | Attending: General Surgery | Admitting: General Surgery

## 2021-04-05 DIAGNOSIS — Z01810 Encounter for preprocedural cardiovascular examination: Secondary | ICD-10-CM | POA: Diagnosis present

## 2021-04-05 MED ORDER — ENSURE PRE-SURGERY PO LIQD: 296.0000 mL | Freq: Once | ORAL | Status: DC

## 2021-04-05 NOTE — Progress Notes (Signed)
° ° ° ° °  Enhanced Recovery after Surgery for Orthopedics Enhanced Recovery after Surgery is a protocol used to improve the stress on your body and your recovery after surgery.  Patient Instructions  The night before surgery:  No food after midnight. ONLY clear liquids after midnight  The day of surgery (if you do NOT have diabetes):  Drink ONE (1) Pre-Surgery Clear Ensure as directed.   This drink was given to you during your hospital  pre-op appointment visit. The pre-op nurse will instruct you on the time to drink the  Pre-Surgery Ensure depending on your surgery time. Finish the drink at the designated time by the pre-op nurse.  Nothing else to drink after completing the  Pre-Surgery Clear Ensure.  The day of surgery (if you have diabetes): Drink ONE (1) Gatorade 2 (G2) as directed. This drink was given to you during your hospital  pre-op appointment visit.  The pre-op nurse will instruct you on the time to drink the   Gatorade 2 (G2) depending on your surgery time. Color of the Gatorade may vary. Red is not allowed. Nothing else to drink after completing the  Gatorade 2 (G2).         If you have questions, please contact your surgeons office.  Surgical soap given with instructions. Patient verbalized understanding.

## 2021-04-08 NOTE — Anesthesia Preprocedure Evaluation (Addendum)
Anesthesia Evaluation  Patient identified by MRN, date of birth, ID band Patient awake    Reviewed: Allergy & Precautions, NPO status , Patient's Chart, lab work & pertinent test results  History of Anesthesia Complications Negative for: history of anesthetic complications  Airway Mallampati: II  TM Distance: >3 FB Neck ROM: Full    Dental no notable dental hx. (+) Dental Advisory Given   Pulmonary neg pulmonary ROS,    Pulmonary exam normal        Cardiovascular hypertension, Pt. on medications and Pt. on home beta blockers Normal cardiovascular exam     Neuro/Psych PSYCHIATRIC DISORDERS Anxiety negative neurological ROS     GI/Hepatic negative GI ROS, Neg liver ROS,   Endo/Other  Hypothyroidism   Renal/GU negative Renal ROS     Musculoskeletal negative musculoskeletal ROS (+)   Abdominal   Peds  Hematology negative hematology ROS (+)   Anesthesia Other Findings   Reproductive/Obstetrics                            Anesthesia Physical Anesthesia Plan  ASA: 2  Anesthesia Plan: General   Post-op Pain Management: Tylenol PO (pre-op) and Celebrex PO (pre-op)   Induction:   PONV Risk Score and Plan: 4 or greater and Ondansetron, Dexamethasone, Midazolam and Scopolamine patch - Pre-op  Airway Management Planned: LMA  Additional Equipment:   Intra-op Plan:   Post-operative Plan: Extubation in OR  Informed Consent: I have reviewed the patients History and Physical, chart, labs and discussed the procedure including the risks, benefits and alternatives for the proposed anesthesia with the patient or authorized representative who has indicated his/her understanding and acceptance.     Dental advisory given  Plan Discussed with: Anesthesiologist and CRNA  Anesthesia Plan Comments:        Anesthesia Quick Evaluation

## 2021-04-09 ENCOUNTER — Ambulatory Visit (HOSPITAL_BASED_OUTPATIENT_CLINIC_OR_DEPARTMENT_OTHER): Payer: 59 | Admitting: Anesthesiology

## 2021-04-09 ENCOUNTER — Ambulatory Visit (HOSPITAL_BASED_OUTPATIENT_CLINIC_OR_DEPARTMENT_OTHER)
Admission: RE | Admit: 2021-04-09 | Discharge: 2021-04-09 | Disposition: A | Discharge status: Home / Self Care | Payer: 59 | Source: Ambulatory Visit | Attending: General Surgery | Admitting: General Surgery

## 2021-04-09 ENCOUNTER — Other Ambulatory Visit: Payer: Self-pay

## 2021-04-09 ENCOUNTER — Encounter (HOSPITAL_BASED_OUTPATIENT_CLINIC_OR_DEPARTMENT_OTHER): Payer: Self-pay | Admitting: General Surgery

## 2021-04-09 ENCOUNTER — Encounter (HOSPITAL_BASED_OUTPATIENT_CLINIC_OR_DEPARTMENT_OTHER)
Admission: RE | Disposition: A | Discharge status: Home / Self Care | Payer: Self-pay | Source: Ambulatory Visit | Attending: General Surgery

## 2021-04-09 DIAGNOSIS — D0511 Intraductal carcinoma in situ of right breast: Secondary | ICD-10-CM | POA: Insufficient documentation

## 2021-04-09 DIAGNOSIS — N6011 Diffuse cystic mastopathy of right breast: Secondary | ICD-10-CM | POA: Diagnosis not present

## 2021-04-09 HISTORY — PX: BREAST LUMPECTOMY WITH RADIOACTIVE SEED LOCALIZATION: SHX6424

## 2021-04-09 HISTORY — DX: Family history of other specified conditions: Z84.89

## 2021-04-09 LAB — POCT PREGNANCY, URINE: Preg Test, Ur: NEGATIVE

## 2021-04-09 SURGERY — BREAST LUMPECTOMY WITH RADIOACTIVE SEED LOCALIZATION
Anesthesia: General | Site: Breast | Laterality: Right

## 2021-04-09 MED ORDER — KETOROLAC TROMETHAMINE 15 MG/ML IJ SOLN
INTRAMUSCULAR | Status: AC
  Filled 2021-04-09: qty 1

## 2021-04-09 MED ORDER — CHLORHEXIDINE GLUCONATE CLOTH 2 % EX PADS: 6.0000 | MEDICATED_PAD | Freq: Once | CUTANEOUS | Status: DC

## 2021-04-09 MED ORDER — ONDANSETRON HCL 4 MG/2ML IJ SOLN
INTRAMUSCULAR | Status: AC
  Filled 2021-04-09: qty 2

## 2021-04-09 MED ORDER — SCOPOLAMINE 1 MG/3DAYS TD PT72
MEDICATED_PATCH | TRANSDERMAL | Status: AC
  Filled 2021-04-09: qty 1

## 2021-04-09 MED ORDER — TRAMADOL HCL 50 MG PO TABS: 100.0000 mg | ORAL_TABLET | Freq: Four times a day (QID) | ORAL | 0 refills | Status: DC | PRN

## 2021-04-09 MED ORDER — ACETAMINOPHEN 500 MG PO TABS
1000.0000 mg | ORAL_TABLET | Freq: Once | ORAL | Status: AC
  Administered 2021-04-09: 08:00:00 1000 mg via ORAL

## 2021-04-09 MED ORDER — ACETAMINOPHEN 500 MG PO TABS: 1000.0000 mg | ORAL_TABLET | ORAL | Status: AC

## 2021-04-09 MED ORDER — MIDAZOLAM HCL 5 MG/5ML IJ SOLN
INTRAMUSCULAR | Status: DC | PRN
  Administered 2021-04-09: 2 mg via INTRAVENOUS

## 2021-04-09 MED ORDER — DEXAMETHASONE SODIUM PHOSPHATE 10 MG/ML IJ SOLN
INTRAMUSCULAR | Status: AC
  Filled 2021-04-09: qty 1

## 2021-04-09 MED ORDER — CELECOXIB 200 MG PO CAPS
ORAL_CAPSULE | ORAL | Status: AC
  Filled 2021-04-09: qty 1

## 2021-04-09 MED ORDER — PROPOFOL 10 MG/ML IV BOLUS
INTRAVENOUS | Status: AC
  Filled 2021-04-09: qty 20

## 2021-04-09 MED ORDER — ONDANSETRON HCL 4 MG/2ML IJ SOLN
INTRAMUSCULAR | Status: DC | PRN
  Administered 2021-04-09: 4 mg via INTRAVENOUS

## 2021-04-09 MED ORDER — LIDOCAINE 2% (20 MG/ML) 5 ML SYRINGE
INTRAMUSCULAR | Status: AC
  Filled 2021-04-09: qty 5

## 2021-04-09 MED ORDER — CEFAZOLIN SODIUM-DEXTROSE 2-4 GM/100ML-% IV SOLN
INTRAVENOUS | Status: AC
  Filled 2021-04-09: qty 100

## 2021-04-09 MED ORDER — LIDOCAINE 2% (20 MG/ML) 5 ML SYRINGE
INTRAMUSCULAR | Status: DC | PRN
  Administered 2021-04-09: 80 mg via INTRAVENOUS

## 2021-04-09 MED ORDER — FENTANYL CITRATE (PF) 100 MCG/2ML IJ SOLN
INTRAMUSCULAR | Status: DC | PRN
  Administered 2021-04-09 (×2): 25 ug via INTRAVENOUS
  Administered 2021-04-09: 50 ug via INTRAVENOUS

## 2021-04-09 MED ORDER — CELECOXIB 200 MG PO CAPS
200.0000 mg | ORAL_CAPSULE | Freq: Once | ORAL | Status: AC
  Administered 2021-04-09: 08:00:00 200 mg via ORAL

## 2021-04-09 MED ORDER — KETOROLAC TROMETHAMINE 15 MG/ML IJ SOLN: 15.0000 mg | INTRAMUSCULAR | Status: DC

## 2021-04-09 MED ORDER — AMISULPRIDE (ANTIEMETIC) 5 MG/2ML IV SOLN: 10.0000 mg | Freq: Once | INTRAVENOUS | Status: DC | PRN

## 2021-04-09 MED ORDER — FENTANYL CITRATE (PF) 100 MCG/2ML IJ SOLN
INTRAMUSCULAR | Status: AC
  Filled 2021-04-09: qty 2

## 2021-04-09 MED ORDER — PHENYLEPHRINE HCL (PRESSORS) 10 MG/ML IV SOLN
INTRAVENOUS | Status: DC | PRN
  Administered 2021-04-09 (×2): 40 ug via INTRAVENOUS

## 2021-04-09 MED ORDER — SCOPOLAMINE 1 MG/3DAYS TD PT72
1.0000 | MEDICATED_PATCH | TRANSDERMAL | Status: DC
  Administered 2021-04-09: 08:00:00 1.5 mg via TRANSDERMAL

## 2021-04-09 MED ORDER — MIDAZOLAM HCL 2 MG/2ML IJ SOLN
INTRAMUSCULAR | Status: AC
  Filled 2021-04-09: qty 2

## 2021-04-09 MED ORDER — FENTANYL CITRATE (PF) 100 MCG/2ML IJ SOLN: 25.0000 ug | INTRAMUSCULAR | Status: DC | PRN

## 2021-04-09 MED ORDER — CEFAZOLIN SODIUM-DEXTROSE 2-4 GM/100ML-% IV SOLN
2.0000 g | INTRAVENOUS | Status: AC
  Administered 2021-04-09: 09:00:00 2 g via INTRAVENOUS

## 2021-04-09 MED ORDER — PROMETHAZINE HCL 25 MG/ML IJ SOLN: 6.2500 mg | INTRAMUSCULAR | Status: DC | PRN

## 2021-04-09 MED ORDER — DEXAMETHASONE SODIUM PHOSPHATE 4 MG/ML IJ SOLN
INTRAMUSCULAR | Status: DC | PRN
  Administered 2021-04-09: 10 mg via INTRAVENOUS

## 2021-04-09 MED ORDER — ACETAMINOPHEN 500 MG PO TABS
ORAL_TABLET | ORAL | Status: AC
  Filled 2021-04-09: qty 2

## 2021-04-09 MED ORDER — PROPOFOL 10 MG/ML IV BOLUS
INTRAVENOUS | Status: DC | PRN
  Administered 2021-04-09: 20 mg via INTRAVENOUS
  Administered 2021-04-09: 160 mg via INTRAVENOUS
  Administered 2021-04-09: 20 mg via INTRAVENOUS

## 2021-04-09 MED ORDER — LACTATED RINGERS IV SOLN: INTRAVENOUS | Status: DC

## 2021-04-09 MED ORDER — BUPIVACAINE HCL (PF) 0.25 % IJ SOLN
INTRAMUSCULAR | Status: DC | PRN
  Administered 2021-04-09: 10 mL

## 2021-04-09 SURGICAL SUPPLY — 35 items
BLADE SURG 15 STRL LF DISP TIS (BLADE) ×1 IMPLANT
BLADE SURG 15 STRL SS (BLADE) ×2
CHLORAPREP W/TINT 26 (MISCELLANEOUS) ×3 IMPLANT
CLOSURE WOUND 1/2 X4 (GAUZE/BANDAGES/DRESSINGS) ×1
COVER BACK TABLE 60X90IN (DRAPES) ×3 IMPLANT
COVER MAYO STAND STRL (DRAPES) ×3 IMPLANT
COVER PROBE W GEL 5X96 (DRAPES) ×3 IMPLANT
DERMABOND ADVANCED (GAUZE/BANDAGES/DRESSINGS) ×2
DERMABOND ADVANCED .7 DNX12 (GAUZE/BANDAGES/DRESSINGS) ×1 IMPLANT
DRAPE LAPAROSCOPIC ABDOMINAL (DRAPES) ×3 IMPLANT
DRAPE UTILITY XL STRL (DRAPES) ×3 IMPLANT
ELECT COATED BLADE 2.86 ST (ELECTRODE) ×3 IMPLANT
ELECT REM PT RETURN 9FT ADLT (ELECTROSURGICAL) ×3
ELECTRODE REM PT RTRN 9FT ADLT (ELECTROSURGICAL) ×1 IMPLANT
GLOVE SURG ENC MOIS LTX SZ7 (GLOVE) ×6 IMPLANT
GLOVE SURG UNDER POLY LF SZ7.5 (GLOVE) ×3 IMPLANT
GOWN STRL REUS W/ TWL LRG LVL3 (GOWN DISPOSABLE) ×2 IMPLANT
GOWN STRL REUS W/TWL LRG LVL3 (GOWN DISPOSABLE) ×4
KIT MARKER MARGIN INK (KITS) ×3 IMPLANT
NDL HYPO 25X1 1.5 SAFETY (NEEDLE) ×1 IMPLANT
NEEDLE HYPO 25X1 1.5 SAFETY (NEEDLE) ×3 IMPLANT
PACK BASIN DAY SURGERY FS (CUSTOM PROCEDURE TRAY) ×3 IMPLANT
PENCIL SMOKE EVACUATOR (MISCELLANEOUS) ×3 IMPLANT
SLEEVE SCD COMPRESS KNEE MED (STOCKING) ×3 IMPLANT
SPONGE T-LAP 4X18 ~~LOC~~+RFID (SPONGE) ×3 IMPLANT
STRIP CLOSURE SKIN 1/2X4 (GAUZE/BANDAGES/DRESSINGS) ×2 IMPLANT
SUT MNCRL AB 4-0 PS2 18 (SUTURE) ×3 IMPLANT
SUT SILK 2 0 SH (SUTURE) ×4 IMPLANT
SUT VIC AB 2-0 SH 27 (SUTURE) ×4
SUT VIC AB 2-0 SH 27XBRD (SUTURE) ×1 IMPLANT
SUT VIC AB 3-0 SH 27 (SUTURE) ×3
SUT VIC AB 3-0 SH 27X BRD (SUTURE) ×1 IMPLANT
SYR CONTROL 10ML LL (SYRINGE) ×3 IMPLANT
TOWEL GREEN STERILE FF (TOWEL DISPOSABLE) ×3 IMPLANT
TRAY FAXITRON CT DISP (TRAY / TRAY PROCEDURE) ×3 IMPLANT

## 2021-04-09 NOTE — H&P (Signed)
10 yof who has screening mammogram with c density breasts. On right side she has 1.1 cm area of calcifications. This underwent biopsy and is intermediate grade DCIS that is er/pr positive. She has no mas or dc. She has no prior breast history or family history. She is here with her boyfriend  Review of Systems: A complete review of systems was obtained from the patient. I have reviewed this information and discussed as appropriate with the patient. See HPI as well for other ROS.  Review of Systems  All other systems reviewed and are negative.   Medical History: Past Medical History:  Diagnosis Date   Anxiety   Hyperlipidemia   Hypertension    History reviewed. No pertinent surgical history.   No Known Allergies  Current Outpatient Medications on File Prior to Visit  Medication Sig Dispense Refill   amLODIPine (NORVASC) 10 MG tablet Take 1 tablet (10 mg total) by mouth once daily   atenoloL (TENORMIN) 50 MG tablet Take 1 tablet (50 mg total) by mouth once daily   ezetimibe (ZETIA) 10 mg tablet Take 1 tablet (10 mg total) by mouth once daily    Family History  Problem Relation Age of Onset   Stroke Mother   High blood pressure (Hypertension) Mother    Social History   Tobacco Use  Smoking Status Never  Smokeless Tobacco Never    Social History   Socioeconomic History   Marital status: Unknown  Tobacco Use   Smoking status: Never   Smokeless tobacco: Never  Substance and Sexual Activity   Alcohol use: Yes   Drug use: Yes  Comment: Marijuana   Objective:   Physical Exam Constitutional:  Appearance: Normal appearance.  Chest:  Breasts: Right: No inverted nipple, mass or nipple discharge.  Left: No inverted nipple, mass or nipple discharge.  Lymphadenopathy:  Upper Body:  Right upper body: No supraclavicular or axillary adenopathy.  Left upper body: No supraclavicular or axillary adenopathy.  Neurological:  Mental Status: She is alert.      Assessment and Plan:  Right breast DCIS Right breast seed guided lumpectomy  We discussed the staging and pathophysiology of breast cancer. We discussed all of the different options for treatment for breast cancer including surgery, chemotherapy, radiation therapy, Herceptin, and antiestrogen therapy. We discussed DCIS. She would be candidate for COMET and we discussed this at length today. She would talk to research and she is going to consider that and let us know otherwise will proceed with standard therapy with surgery.  There is no role for a node biopsy.  We discussed the options for treatment of the breast cancer which included lumpectomy versus a mastectomy. We discussed the performance of the lumpectomy with radioactive seed placement. We discussed a 5-10% chance of a positive margin requiring reexcision in the operating room. We also discussed that she will likely need radiation therapy if she undergoes lumpectomy. We discussed mastectomy and the postoperative care for that as well. Mastectomy can be followed by reconstruction. The decision for lumpectomy vs mastectomy has no impact on decision for chemotherapy. Most mastectomy patients will not need radiation therapy. We discussed that there is no difference in her survival whether she undergoes lumpectomy with radiation therapy or antiestrogen therapy versus a mastectomy. There is also no real difference between her recurrence in the breast. We discussed the risks of operation including bleeding, infection, possible reoperation. She understands her further therapy will be based on what her stages at the time of her operation.

## 2021-04-09 NOTE — Discharge Instructions (Addendum)
[place] Office Phone Number [phone] POST OP INSTRUCTIONS Take 400 mg of ibuprofen every 8 hours or 650 mg tylenol every 6 hours for next 72 hours then as needed. Use ice several times daily also. Always review your discharge instruction sheet given to you by the facility where your surgery was performed.  IF YOU HAVE DISABILITY OR FAMILY LEAVE FORMS, YOU MUST BRING THEM TO THE OFFICE FOR PROCESSING.  DO NOT GIVE THEM TO YOUR DOCTOR.  A prescription for pain medication may be given to you upon discharge.  Take your pain medication as prescribed, if needed.  If narcotic pain medicine is not needed, then you may take acetaminophen (Tylenol), naprosyn (Alleve) or ibuprofen (Advil) as needed. Take your usually prescribed medications unless otherwise directed If you need a refill on your pain medication, please contact your pharmacy.  They will contact our office to request authorization.  Prescriptions will not be filled after 5pm or on week-ends. You should eat very light the first 24 hours after surgery, such as soup, crackers, pudding, etc.  Resume your normal diet the day after surgery. Most patients will experience some swelling and bruising in the breast.  Ice packs and a good support bra will help.  Wear the breast binder provided or a sports bra for 72 hours day and night.  After that wear a sports bra during the day until you return to the office. Swelling and bruising can take several days to resolve.  It is common to experience some constipation if taking pain medication after surgery.  Increasing fluid intake and taking a stool softener will usually help or prevent this problem from occurring.  A mild laxative (Milk of Magnesia or Miralax) should be taken according to package directions if there are no bowel movements after 48 hours. Unless discharge instructions indicate otherwise, you may remove your bandages 48 hours after surgery and you may shower at that time.  You  may have steri-strips (small skin tapes) in place directly over the incision.  These strips should be left on the skin for 7-10 days and will come off on their own.  If your surgeon used skin glue on the incision, you may shower in 24 hours.  The glue will flake off over the next 2-3 weeks.  Any sutures or staples will be removed at the office during your follow-up visit. ACTIVITIES:  You may resume regular daily activities (gradually increasing) beginning the next day.  Wearing a good support bra or sports bra minimizes pain and swelling.  You may have sexual intercourse when it is comfortable. You may drive when you no longer are taking prescription pain medication, you can comfortably wear a seatbelt, and you can safely maneuver your car and apply brakes. RETURN TO WORK:  ______________________________________________________________________________________ Dennis Bast should see your doctor in the office for a follow-up appointment approximately two weeks after your surgery.  Your doctors nurse will typically make your follow-up appointment when she calls you with your pathology report.  Expect your pathology report 3-4 business days after your surgery.  You may call to check if you do not hear from [place] after three days. OTHER INSTRUCTIONS: _______________________________________________________________________________________________ _____________________________________________________________________________________________________________________________________ _____________________________________________________________________________________________________________________________________ _____________________________________________________________________________________________________________________________________  WHEN TO CALL DR WAKEFIELD: Fever over 101.0 Nausea and/or vomiting. Extreme swelling or bruising. Continued bleeding from incision. Increased pain, redness, or drainage from the  incision.  The clinic staff is available to answer your questions during regular business hours.  Please dont hesitate to call and ask to speak to  one of the nurses for clinical concerns.  If you have a medical emergency, go to the nearest emergency room or call 911.  A surgeon from [facility] is always on call at the hospital.  For further questions, please visit [email or web address]  No Tylenol until 1:49 pm No Ibuprofen/Motrin until 3:48 pm   Post Anesthesia Home Care Instructions  Activity: Get plenty of rest for the remainder of the day. A responsible individual must stay with you for 24 hours following the procedure.  For the next 24 hours, DO NOT: -Drive a car -Paediatric nurse -Drink alcoholic beverages -Take any medication unless instructed by your physician -Make any legal decisions or sign important papers.  Meals: Start with liquid foods such as gelatin or soup. Progress to regular foods as tolerated. Avoid greasy, spicy, heavy foods. If nausea and/or vomiting occur, drink only clear liquids until the nausea and/or vomiting subsides. Call your physician if vomiting continues.  Special Instructions/Symptoms: Your throat may feel dry or sore from the anesthesia or the breathing tube placed in your throat during surgery. If this causes discomfort, gargle with warm salt water. The discomfort should disappear within 24 hours.  If you had a scopolamine patch placed behind your ear for the management of post- operative nausea and/or vomiting:  1. The medication in the patch is effective for 72 hours, after which it should be removed.  Wrap patch in a tissue and discard in the trash. Wash hands thoroughly with soap and water. 2. You may remove the patch earlier than 72 hours if you experience unpleasant side effects which may include dry mouth, dizziness or visual disturbances. 3. Avoid touching the patch. Wash your hands with soap and water after contact  with the patch.

## 2021-04-09 NOTE — Op Note (Signed)
Preoperative diagnosis: Right breast intermediate grade ductal carcinoma in situ Postoperative diagnosis: Same as above Procedure: Right breast radioactive seed guided lumpectomy Surgeon: Dr. Serita Grammes Estimated blood loss: Minimal Anesthesia: General Specimens: 1.  Right breast tissue containing seed and clip marked with paint 2.  Additional posterior, lateral, inferior margins marked short superior, long lateral, double deep Complications: None Drains: None Sponge and count was correct at completion Disposition to recovery stable condition  Indications: This a 45 year old female who had a screening mammogram on the right side at 1.1 cm area of calcifications.  This underwent a biopsy and is intermediate grade DCIS that is ER/PR positive.  She did not want a participate in a trial and wished for standard therapy.  We discussed lumpectomy.  She had a seed placed prior to beginning.  Procedure: After informed consent was obtained the patient was taken to the operating room.  She was given antibiotics.  SCDs were in place.  She was placed under general anesthesia without complication.  She was prepped and draped in the standard sterile surgical fashion.  A surgical timeout was then performed.  The seed was a couple centimeters from the skin in the lateral breast.  I elected to inject Marcaine throughout this area and then make a curvilinear incision overlying where the seed was to hopefully make sure I had clear anterior margin.  I then used the neoprobe to excise the seed and the surrounding tissue which was very dense with an attempt to get a clear margin.  Mammogram confirmed removal of the seed and the clip.  I did take 3 additional margins after reviewing my 3D images to hopefully get clear margins with final pathology.  Hemostasis was observed.  I then closed the breast tissue with 2-0 Vicryl.  Skin was closed with 3-0 Vicryl for Monocryl.  Glue and Steri-Strips were applied.  She  tolerated this well was extubated transferred to recovery stable.

## 2021-04-09 NOTE — Transfer of Care (Signed)
Immediate Anesthesia Transfer of Care Note  Patient: Ann Thornton  Procedure(s) Performed: RIGHT BREAST LUMPECTOMY WITH RADIOACTIVE SEED LOCALIZATION (Right: Breast)  Patient Location: PACU  Anesthesia Type:General  Level of Consciousness: drowsy  Airway & Oxygen Therapy: Patient Spontanous Breathing and Patient connected to face mask oxygen  Post-op Assessment: Report given to RN and Post -op Vital signs reviewed and stable  Post vital signs: Reviewed and stable  Last Vitals:  Vitals Value Taken Time  BP    Temp    Pulse 96 04/09/21 0945  Resp    SpO2 97 % 04/09/21 0945  Vitals shown include unvalidated device data.  Last Pain:  Vitals:   04/09/21 0746  TempSrc: Oral  PainSc: 0-No pain         Complications: No notable events documented.

## 2021-04-09 NOTE — Interval H&P Note (Signed)
History and Physical Interval Note:  04/09/2021 8:12 AM  Ann Thornton  has presented today for surgery, with the diagnosis of RIGHT BREAST DCIS.  The various methods of treatment have been discussed with the patient and family. After consideration of risks, benefits and other options for treatment, the patient has consented to  Procedure(s): RIGHT BREAST LUMPECTOMY WITH RADIOACTIVE SEED LOCALIZATION (Right) as a surgical intervention.  The patient's history has been reviewed, patient examined, no change in status, stable for surgery.  I have reviewed the patient's chart and labs.  Questions were answered to the patient's satisfaction.     Rolm Bookbinder

## 2021-04-09 NOTE — Anesthesia Procedure Notes (Addendum)
Procedure Name: LMA Insertion Date/Time: 04/09/2021 3:47 AM Performed by: Ezequiel Kayser, CRNA Pre-anesthesia Checklist: Patient identified, Emergency Drugs available, Suction available, Patient being monitored and Timeout performed Patient Re-evaluated:Patient Re-evaluated prior to induction Oxygen Delivery Method: Circle system utilized Preoxygenation: Pre-oxygenation with 100% oxygen Induction Type: IV induction LMA: LMA inserted LMA Size: 4.0 Number of attempts: 1 Placement Confirmation: positive ETCO2 and breath sounds checked- equal and bilateral ETT to lip (cm): yes. Tube secured with: Tape Dental Injury: Teeth and Oropharynx as per pre-operative assessment

## 2021-04-09 NOTE — Anesthesia Postprocedure Evaluation (Signed)
Anesthesia Post Note  Patient: Ann Thornton  Procedure(s) Performed: RIGHT BREAST LUMPECTOMY WITH RADIOACTIVE SEED LOCALIZATION (Right: Breast)     Patient location during evaluation: PACU Anesthesia Type: General Level of consciousness: sedated Pain management: pain level controlled Vital Signs Assessment: post-procedure vital signs reviewed and stable Respiratory status: spontaneous breathing and respiratory function stable Cardiovascular status: stable Postop Assessment: no apparent nausea or vomiting Anesthetic complications: no   No notable events documented.  Last Vitals:  Vitals:   04/09/21 1010 04/09/21 1032  BP: 129/78 126/81  Pulse: 82 89  Resp: 16 17  Temp:  36.7 C  SpO2: 96% 96%    Last Pain:  Vitals:   04/09/21 1020  TempSrc:   PainSc: 0-No pain                 Bhumi Godbey DANIEL

## 2021-04-10 ENCOUNTER — Encounter (HOSPITAL_BASED_OUTPATIENT_CLINIC_OR_DEPARTMENT_OTHER): Payer: Self-pay | Admitting: General Surgery

## 2021-04-11 LAB — SURGICAL PATHOLOGY

## 2021-04-18 ENCOUNTER — Encounter: Payer: Self-pay | Admitting: *Deleted

## 2021-04-25 ENCOUNTER — Inpatient Hospital Stay
Admission: RE | Admit: 2021-04-25 | Discharge: 2021-04-25 | Disposition: A | Discharge status: Home / Self Care | Payer: Self-pay | Source: Ambulatory Visit | Attending: Radiation Oncology | Admitting: Radiation Oncology

## 2021-04-25 ENCOUNTER — Other Ambulatory Visit: Payer: Self-pay | Admitting: Radiation Oncology

## 2021-04-25 DIAGNOSIS — D0511 Intraductal carcinoma in situ of right breast: Secondary | ICD-10-CM

## 2021-05-01 NOTE — Progress Notes (Signed)
Location of Breast Cancer: Ductal carcinoma in situ (DCIS) of right breast  Histology per Pathology Report:    Clinical History: right breast DCIS (cm)   FINAL MICROSCOPIC DIAGNOSIS:   A. BREAST, RIGHT, LUMPECTOMY:  -  Ductal carcinoma in situ (DCIS), nuclear grade II (intermediate),  micropapillary pattern, 2 mm in greatest dimension.  -  Resection edges negative for DCIS.  -  Distance from DCIS to closest resected edge: 1 mm (inferior edge),  see Cancer Protocol below for final margin status.  -  Prior biopsy site changes/scar.  -  Fibrocystic changes with cystic apocrine metaplasia/hyperplasia.   B. BREAST, RIGHT ADDITIONAL LATERAL MARGIN, EXCISION:  -  Benign breast tissue.   C. BREAST, RIGHT ADDITIONAL INFERIOR MARGIN, EXCISION:  -  Benign breast tissue.   D. BREAST, RIGHT ADDITIONAL POSTERIOR MARGIN, EXCISION:  -  Benign breast tissue.   Receptor Status:  Estrogen Receptor: Positive, greater than 95% strong staining  intensity       Progesterone Receptor: Positive, greater than 95% strong staining  intensity  Did patient present with symptoms (if so, please note symptoms) or was this found on screening mammography?: found on screening mammogram.  Past/Anticipated interventions by surgeon, if any:  Procedure: Right breast radioactive seed guided lumpectomy Surgeon: Dr. Serita Grammes  Past/Anticipated interventions by medical oncology, if any: Dr Burr Medico I recommend adjuvant endocrine therapy with tamoxifen  Lymphedema issues, if any:  no    Pain issues, if any:  no   SAFETY ISSUES: Prior radiation? no Pacemaker/ICD? no Possible current pregnancy?no, currently menstruating Is the patient on methotrexate? no  Current Complaints / other details:  none    Vitals:   05/07/21 0911  BP: 127/76  Pulse: 87  Resp: 18  Temp: (!) 96.2 F (35.7 C)  TempSrc: Temporal  SpO2: 100%  Weight: 196 lb 4 oz (89 kg)  Height: 5\' 4"  (1.626 m)

## 2021-05-06 NOTE — Progress Notes (Signed)
Radiation Oncology         [phone] ________________________________  Name: Ann Thornton MRN: [id number]  Date: 05/07/2021  DOB: [birth year]  Re-Evaluation Note  CC: Horald Pollen, MD  Truitt Merle, MD    ICD-10-CM   1. Ductal carcinoma in situ (DCIS) of right breast  D05.11       Diagnosis:  Status post right lumpectomy:  Stage 0 (cTis (DCIS), cN0, cM0) Right Breast, Intermediate grade ductal carcinoma in-situ, ER+ / PR+ / Her2 not assessed  Narrative:  The patient returns today to discuss radiation treatment options. She was seen in the multidisciplinary breast clinic on 03/07/21. On the date of her [facility] consultation, the patient met with Dr. Burr Medico to discuss treatment options. During which time, Dr. Burr Medico informed the patient that any form of adjuvant treatment will be preventative in nature. Given the patient's strong ER and PR positivity of the tumor cells, and her pre-menopausal status, Dr. Burr Medico recommended adjuvant endocrine therapy with tamoxifen. The patient will return to Dr. Burr Medico following RT to discuss this further. The patient otherwise denied any complaints.  Of note: Dr. Burr Medico filled out the patient's [facility] recurrence nomogram to determine her risk of recurrence, which showed her 10 year risk at 16%. Given the patient's young age, her lifetime risk is 30+%. This also indicated that adding AI therapy will decrease the patient's 10 year risk to 6%.  Since consultation, she underwent genetic testing on 03/07/21. Results showed no clinically significant variants detected from +RNAinsight testing.  She opted to proceed with right breast lumpectomy (without nodal biopsies) on 04/09/21. Pathology from the procedure revealed: intermediate grade ductal carcinoma in-situ, micropapillary pattern, measuring 2 mm in greatest dimension, with fibrocystic changes with cystic apocrine metaplasia/hyperplasia. All margins negative for DCIS (with the the closest resected edge  being 98m from the inferior edge). Prognostic indicators significant for: ER status >95% positive, PR status >95% positive, both with strong staining intensity; Her2 not assessed.   On review of systems, the patient reports no discomfort or pain within the right breast area. She denies swelling in her right arm or hand and any other symptoms.    Allergies:  has No Known Allergies.  Meds: Current Outpatient Medications  Medication Sig Dispense Refill   amLODipine (NORVASC) 10 MG tablet Take 1 tablet (10 mg total) by mouth daily. 30 tablet 0   atenolol (TENORMIN) 50 MG tablet TAKE 1 TABLET BY MOUTH EVERY DAY 30 tablet 3   ezetimibe (ZETIA) 10 MG tablet Take 1 tablet (10 mg total) by mouth daily. 30 tablet 0   irbesartan (AVAPRO) 150 MG tablet Take 1 tablet (150 mg total) by mouth daily. 30 tablet 0   levothyroxine (SYNTHROID) 50 MCG tablet Take 1 tablet (50 mcg total) by mouth daily. 90 tablet 3   naproxen sodium (ALEVE) 220 MG tablet Take 220 mg by mouth 2 (two) times daily as needed (As needed but not daily). Pt takes 2 pills for a total of 4476m    traMADol (ULTRAM) 50 MG tablet Take 2 tablets (100 mg total) by mouth every 6 (six) hours as needed. (Patient not taking: Reported on 05/07/2021) 10 tablet 0   No current facility-administered medications for this encounter.    Physical Findings: The patient is in no acute distress. Patient is alert and oriented.  height is _0  (1.626 m) and weight is 196 lb 4 oz (89 kg). Her temporal temperature is 96.2 F (35.7 C) (abnormal). Her blood  pressure is 127/76 and her pulse is 87. Her respiration is 18 and oxygen saturation is 100%.  No significant changes. Lungs are clear to auscultation bilaterally. Heart has regular rate and rhythm. No palpable cervical, supraclavicular, or axillary adenopathy. Abdomen soft, non-tender, normal bowel sounds. Left breast: no palpable mass, nipple discharge or bleeding. Right breast: Well-healed lumpectomy scar in  the lateral aspect of the breast.  No signs of drainage or infection.  Lab Findings: Lab Results  Component Value Date   WBC 5.9 03/07/2021   HGB 11.2 (L) 03/07/2021   HCT 32.6 (L) 03/07/2021   MCV 95.3 03/07/2021   PLT 357 03/07/2021    Radiographic Findings: No results found.  Impression:  Stage 0 (cTis (DCIS), cN0, cM0) Right Breast, Intermediate grade ductal carcinoma in-situ, ER+ / PR+ / Her2 not assessed  She would be a good candidate for adjuvant radiation therapy to reduce the chances of recurrence within the right breast.  I discussed the general course of radiation therapy anticipated side effects and potential long-term toxicities of right breast radiation therapy with the patient.  She appears to understand and wishes to proceed with radiation therapy as recommended.  Plan:  Patient is scheduled for CT simulation January 23.  Treatments began approximately a week later.  Anticipate 4 weeks of hypofractionated accelerated radiation therapy directed at the right breast.  -----------------------------------  Blair Promise, PhD, MD  This document serves as a record of services personally performed by Gery Pray, MD. It was created on his behalf by Roney Mans, a trained medical scribe. The creation of this record is based on the scribe's personal observations and the provider's statements to them. This document has been checked and approved by the attending provider.

## 2021-05-07 ENCOUNTER — Ambulatory Visit
Admission: RE | Admit: 2021-05-07 | Discharge: 2021-05-07 | Disposition: A | Discharge status: Home / Self Care | Payer: 59 | Source: Ambulatory Visit | Attending: Radiation Oncology | Admitting: Radiation Oncology

## 2021-05-07 ENCOUNTER — Other Ambulatory Visit: Payer: Self-pay

## 2021-05-07 ENCOUNTER — Encounter: Payer: Self-pay | Admitting: Radiation Oncology

## 2021-05-07 VITALS — BP 127/76 | HR 87 | Temp 96.2°F | Resp 18 | Ht 64.0 in | Wt 196.2 lb

## 2021-05-07 DIAGNOSIS — D0511 Intraductal carcinoma in situ of right breast: Secondary | ICD-10-CM

## 2021-05-07 DIAGNOSIS — Z170 Estrogen receptor positive status [ER+]: Secondary | ICD-10-CM | POA: Diagnosis not present

## 2021-05-07 DIAGNOSIS — Z79899 Other long term (current) drug therapy: Secondary | ICD-10-CM | POA: Insufficient documentation

## 2021-05-07 HISTORY — DX: Malignant neoplasm of unspecified site of unspecified female breast: C50.919

## 2021-05-07 NOTE — Progress Notes (Signed)
See MD note for nursing evaluation. °

## 2021-05-11 ENCOUNTER — Encounter: Payer: Self-pay | Admitting: *Deleted

## 2021-05-14 ENCOUNTER — Other Ambulatory Visit: Payer: Self-pay

## 2021-05-14 ENCOUNTER — Ambulatory Visit
Admission: RE | Admit: 2021-05-14 | Discharge: 2021-05-14 | Disposition: A | Discharge status: Home / Self Care | Payer: 59 | Source: Ambulatory Visit | Attending: Radiation Oncology | Admitting: Radiation Oncology

## 2021-05-14 DIAGNOSIS — Z510 Encounter for antineoplastic radiation therapy: Secondary | ICD-10-CM | POA: Insufficient documentation

## 2021-05-14 DIAGNOSIS — D0511 Intraductal carcinoma in situ of right breast: Secondary | ICD-10-CM | POA: Diagnosis not present

## 2021-05-15 ENCOUNTER — Telehealth: Payer: Self-pay | Admitting: *Deleted

## 2021-05-15 NOTE — Telephone Encounter (Signed)
Received VM from patient requesting fax number to send FMLA forms to. Called and spoke with patient who states she was able to get in touch with someone to get this information. Gave her the contact information as well for forms coordinator.

## 2021-05-20 DIAGNOSIS — Z510 Encounter for antineoplastic radiation therapy: Secondary | ICD-10-CM | POA: Diagnosis not present

## 2021-05-21 ENCOUNTER — Encounter: Payer: Self-pay | Admitting: *Deleted

## 2021-05-21 ENCOUNTER — Other Ambulatory Visit: Payer: Self-pay

## 2021-05-21 ENCOUNTER — Ambulatory Visit
Admission: RE | Admit: 2021-05-21 | Discharge: 2021-05-21 | Disposition: A | Discharge status: Home / Self Care | Payer: 59 | Source: Ambulatory Visit | Attending: Radiation Oncology | Admitting: Radiation Oncology

## 2021-05-21 DIAGNOSIS — D0511 Intraductal carcinoma in situ of right breast: Secondary | ICD-10-CM

## 2021-05-21 DIAGNOSIS — Z510 Encounter for antineoplastic radiation therapy: Secondary | ICD-10-CM | POA: Diagnosis not present

## 2021-05-22 ENCOUNTER — Ambulatory Visit
Admission: RE | Admit: 2021-05-22 | Discharge: 2021-05-22 | Disposition: A | Discharge status: Home / Self Care | Payer: 59 | Source: Ambulatory Visit | Attending: Radiation Oncology | Admitting: Radiation Oncology

## 2021-05-22 DIAGNOSIS — Z510 Encounter for antineoplastic radiation therapy: Secondary | ICD-10-CM | POA: Diagnosis not present

## 2021-05-22 DIAGNOSIS — D0511 Intraductal carcinoma in situ of right breast: Secondary | ICD-10-CM

## 2021-05-22 MED ORDER — RADIAPLEXRX EX GEL
1.0000 "application " | Freq: Once | CUTANEOUS | Status: AC
  Administered 2021-05-22: 1 via TOPICAL

## 2021-05-23 ENCOUNTER — Other Ambulatory Visit: Payer: Self-pay

## 2021-05-23 ENCOUNTER — Ambulatory Visit
Admission: RE | Admit: 2021-05-23 | Discharge: 2021-05-23 | Disposition: A | Discharge status: Home / Self Care | Payer: 59 | Source: Ambulatory Visit | Attending: Radiation Oncology | Admitting: Radiation Oncology

## 2021-05-23 DIAGNOSIS — Z510 Encounter for antineoplastic radiation therapy: Secondary | ICD-10-CM | POA: Insufficient documentation

## 2021-05-23 DIAGNOSIS — E079 Disorder of thyroid, unspecified: Secondary | ICD-10-CM | POA: Diagnosis not present

## 2021-05-23 DIAGNOSIS — Z79899 Other long term (current) drug therapy: Secondary | ICD-10-CM | POA: Diagnosis not present

## 2021-05-23 DIAGNOSIS — D0511 Intraductal carcinoma in situ of right breast: Secondary | ICD-10-CM | POA: Insufficient documentation

## 2021-05-23 DIAGNOSIS — Z170 Estrogen receptor positive status [ER+]: Secondary | ICD-10-CM | POA: Diagnosis not present

## 2021-05-24 ENCOUNTER — Ambulatory Visit
Admission: RE | Admit: 2021-05-24 | Discharge: 2021-05-24 | Disposition: A | Discharge status: Home / Self Care | Payer: 59 | Source: Ambulatory Visit | Attending: Radiation Oncology | Admitting: Radiation Oncology

## 2021-05-24 DIAGNOSIS — Z510 Encounter for antineoplastic radiation therapy: Secondary | ICD-10-CM | POA: Diagnosis not present

## 2021-05-25 ENCOUNTER — Ambulatory Visit
Admission: RE | Admit: 2021-05-25 | Discharge: 2021-05-25 | Disposition: A | Discharge status: Home / Self Care | Payer: 59 | Source: Ambulatory Visit | Attending: Radiation Oncology | Admitting: Radiation Oncology

## 2021-05-25 DIAGNOSIS — Z510 Encounter for antineoplastic radiation therapy: Secondary | ICD-10-CM | POA: Diagnosis not present

## 2021-05-28 ENCOUNTER — Ambulatory Visit
Admission: RE | Admit: 2021-05-28 | Discharge: 2021-05-28 | Disposition: A | Discharge status: Home / Self Care | Payer: 59 | Source: Ambulatory Visit | Attending: Radiation Oncology | Admitting: Radiation Oncology

## 2021-05-28 ENCOUNTER — Other Ambulatory Visit: Payer: Self-pay

## 2021-05-28 DIAGNOSIS — Z510 Encounter for antineoplastic radiation therapy: Secondary | ICD-10-CM | POA: Diagnosis not present

## 2021-05-29 ENCOUNTER — Telehealth: Payer: Self-pay | Admitting: Hematology

## 2021-05-29 ENCOUNTER — Ambulatory Visit
Admission: RE | Admit: 2021-05-29 | Discharge: 2021-05-29 | Disposition: A | Discharge status: Home / Self Care | Payer: 59 | Source: Ambulatory Visit | Attending: Radiation Oncology | Admitting: Radiation Oncology

## 2021-05-29 DIAGNOSIS — Z510 Encounter for antineoplastic radiation therapy: Secondary | ICD-10-CM | POA: Diagnosis not present

## 2021-05-29 NOTE — Telephone Encounter (Signed)
Sch per 1/30 inbasket, pt aware

## 2021-05-30 ENCOUNTER — Other Ambulatory Visit: Payer: Self-pay

## 2021-05-30 ENCOUNTER — Ambulatory Visit
Admission: RE | Admit: 2021-05-30 | Discharge: 2021-05-30 | Disposition: A | Discharge status: Home / Self Care | Payer: 59 | Source: Ambulatory Visit | Attending: Radiation Oncology | Admitting: Radiation Oncology

## 2021-05-30 DIAGNOSIS — Z510 Encounter for antineoplastic radiation therapy: Secondary | ICD-10-CM | POA: Diagnosis not present

## 2021-05-31 ENCOUNTER — Ambulatory Visit
Admission: RE | Admit: 2021-05-31 | Discharge: 2021-05-31 | Disposition: A | Discharge status: Home / Self Care | Payer: 59 | Source: Ambulatory Visit | Attending: Radiation Oncology | Admitting: Radiation Oncology

## 2021-05-31 DIAGNOSIS — Z510 Encounter for antineoplastic radiation therapy: Secondary | ICD-10-CM | POA: Diagnosis not present

## 2021-06-01 ENCOUNTER — Other Ambulatory Visit: Payer: Self-pay

## 2021-06-01 ENCOUNTER — Ambulatory Visit
Admission: RE | Admit: 2021-06-01 | Discharge: 2021-06-01 | Disposition: A | Discharge status: Home / Self Care | Payer: 59 | Source: Ambulatory Visit | Attending: Radiation Oncology | Admitting: Radiation Oncology

## 2021-06-01 DIAGNOSIS — Z510 Encounter for antineoplastic radiation therapy: Secondary | ICD-10-CM | POA: Diagnosis not present

## 2021-06-02 DIAGNOSIS — Z510 Encounter for antineoplastic radiation therapy: Secondary | ICD-10-CM | POA: Diagnosis not present

## 2021-06-04 ENCOUNTER — Other Ambulatory Visit: Payer: Self-pay

## 2021-06-04 ENCOUNTER — Ambulatory Visit
Admission: RE | Admit: 2021-06-04 | Discharge: 2021-06-04 | Disposition: A | Discharge status: Home / Self Care | Payer: 59 | Source: Ambulatory Visit | Attending: Radiation Oncology | Admitting: Radiation Oncology

## 2021-06-04 DIAGNOSIS — Z510 Encounter for antineoplastic radiation therapy: Secondary | ICD-10-CM | POA: Diagnosis not present

## 2021-06-05 ENCOUNTER — Other Ambulatory Visit: Payer: Self-pay

## 2021-06-05 ENCOUNTER — Ambulatory Visit: Payer: 59 | Admitting: Radiation Oncology

## 2021-06-05 ENCOUNTER — Ambulatory Visit
Admission: RE | Admit: 2021-06-05 | Discharge: 2021-06-05 | Disposition: A | Discharge status: Home / Self Care | Payer: 59 | Source: Ambulatory Visit | Attending: Radiation Oncology | Admitting: Radiation Oncology

## 2021-06-05 DIAGNOSIS — Z510 Encounter for antineoplastic radiation therapy: Secondary | ICD-10-CM | POA: Diagnosis not present

## 2021-06-06 ENCOUNTER — Ambulatory Visit
Admission: RE | Admit: 2021-06-06 | Discharge: 2021-06-06 | Disposition: A | Discharge status: Home / Self Care | Payer: 59 | Source: Ambulatory Visit | Attending: Radiation Oncology | Admitting: Radiation Oncology

## 2021-06-06 DIAGNOSIS — Z510 Encounter for antineoplastic radiation therapy: Secondary | ICD-10-CM | POA: Diagnosis not present

## 2021-06-07 ENCOUNTER — Other Ambulatory Visit: Payer: Self-pay

## 2021-06-07 ENCOUNTER — Ambulatory Visit
Admission: RE | Admit: 2021-06-07 | Discharge: 2021-06-07 | Disposition: A | Discharge status: Home / Self Care | Payer: 59 | Source: Ambulatory Visit | Attending: Radiation Oncology | Admitting: Radiation Oncology

## 2021-06-07 DIAGNOSIS — Z510 Encounter for antineoplastic radiation therapy: Secondary | ICD-10-CM | POA: Diagnosis not present

## 2021-06-08 ENCOUNTER — Ambulatory Visit
Admission: RE | Admit: 2021-06-08 | Discharge: 2021-06-08 | Disposition: A | Discharge status: Home / Self Care | Payer: 59 | Source: Ambulatory Visit | Attending: Radiation Oncology | Admitting: Radiation Oncology

## 2021-06-08 DIAGNOSIS — Z510 Encounter for antineoplastic radiation therapy: Secondary | ICD-10-CM | POA: Diagnosis not present

## 2021-06-11 ENCOUNTER — Other Ambulatory Visit: Payer: Self-pay

## 2021-06-11 ENCOUNTER — Ambulatory Visit
Admission: RE | Admit: 2021-06-11 | Discharge: 2021-06-11 | Disposition: A | Discharge status: Home / Self Care | Payer: 59 | Source: Ambulatory Visit | Attending: Radiation Oncology | Admitting: Radiation Oncology

## 2021-06-11 DIAGNOSIS — Z510 Encounter for antineoplastic radiation therapy: Secondary | ICD-10-CM | POA: Diagnosis not present

## 2021-06-12 ENCOUNTER — Ambulatory Visit
Admission: RE | Admit: 2021-06-12 | Discharge: 2021-06-12 | Disposition: A | Discharge status: Home / Self Care | Payer: 59 | Source: Ambulatory Visit | Attending: Radiation Oncology | Admitting: Radiation Oncology

## 2021-06-12 DIAGNOSIS — Z510 Encounter for antineoplastic radiation therapy: Secondary | ICD-10-CM | POA: Diagnosis not present

## 2021-06-13 ENCOUNTER — Other Ambulatory Visit: Payer: Self-pay

## 2021-06-13 ENCOUNTER — Ambulatory Visit
Admission: RE | Admit: 2021-06-13 | Discharge: 2021-06-13 | Disposition: A | Discharge status: Home / Self Care | Payer: 59 | Source: Ambulatory Visit | Attending: Radiation Oncology | Admitting: Radiation Oncology

## 2021-06-13 ENCOUNTER — Encounter: Payer: Self-pay | Admitting: Hematology

## 2021-06-13 ENCOUNTER — Inpatient Hospital Stay: Payer: 59 | Attending: Hematology | Admitting: Hematology

## 2021-06-13 VITALS — BP 131/86 | HR 98 | Temp 99.1°F | Resp 18 | Ht 64.0 in | Wt 199.1 lb

## 2021-06-13 DIAGNOSIS — D0511 Intraductal carcinoma in situ of right breast: Secondary | ICD-10-CM | POA: Diagnosis not present

## 2021-06-13 DIAGNOSIS — Z510 Encounter for antineoplastic radiation therapy: Secondary | ICD-10-CM | POA: Insufficient documentation

## 2021-06-13 DIAGNOSIS — E079 Disorder of thyroid, unspecified: Secondary | ICD-10-CM | POA: Insufficient documentation

## 2021-06-13 DIAGNOSIS — Z79899 Other long term (current) drug therapy: Secondary | ICD-10-CM | POA: Insufficient documentation

## 2021-06-13 DIAGNOSIS — Z170 Estrogen receptor positive status [ER+]: Secondary | ICD-10-CM | POA: Insufficient documentation

## 2021-06-13 NOTE — Progress Notes (Signed)
[place]   [phone] Fax:([phone]) [phone]   Clinic Follow up Note   Patient Care Team: Horald Pollen, MD as PCP - General (Internal Medicine) Mauro Kaufmann, RN as Oncology Nurse Navigator Rockwell Germany, RN as Oncology Nurse Navigator Rolm Bookbinder, MD as Consulting Physician (General Surgery) Truitt Merle, MD as Consulting Physician (Hematology) Gery Pray, MD as Consulting Physician (Radiation Oncology)  Date of Service:  06/13/2021  CHIEF COMPLAINT: f/u of right breast DCIS  CURRENT THERAPY:  Adjuvant radiation  ASSESSMENT & PLAN:  Ann Thornton is a 46 y.o. female with   1. Right breast DCIS, grade 2, ER+/PR+ -found on screening mammogram. Biopsy on 02/20/21 confirmed intermediate grade DCIS -right lumpectomy on 04/09/21 showed 2 mm DCIS, margins negative. -she is currently receiving adjuvant radiation therapy under Dr. Sondra Come, scheduled to finish 06/15/21. -Given her strongly ER and PR positivity of the tumor cells, and her pre-menopausal status, I recommend adjuvant endocrine therapy with tamoxifen. The potential side effects, which includes but not limited to, hot flash, skin and vaginal dryness, slightly increased risk of cardiovascular disease and cataract, small risk of thrombosis and endometrial cancer, were discussed with her in great details. She is reluctant to take tamoxifen due to the concern of side effects.  She wants to take time to consider. -We discussed breast cancer surveillance after she completes treatment, Including annual mammogram, breast exam every 6-12 months. We also discussed breast MRI, but she notes she is very claustrophobic and does not want to do MRI      PLAN:  -continue daily radiation through 2/24 -she will think about adjuvant tamoxifen to reduce risk of future breast cancer -survivorship in 4 months   No problem-specific Assessment & Plan notes found for this encounter.   SUMMARY OF  ONCOLOGIC HISTORY: Oncology History Overview Note   Cancer Staging  Ductal carcinoma in situ (DCIS) of right breast Staging form: Breast, AJCC 8th Edition - Clinical: Stage 0 (cTis (DCIS), cN0, cM0, ER+, PR+, HER2: Not Assessed) - Signed by Truitt Merle, MD on 03/06/2021 - Pathologic stage from 04/09/2021: Stage Unknown (pTis (DCIS), pNX, cM0, G2, ER+, PR+, HER2: Not Assessed) - Signed by Truitt Merle, MD on 06/12/2021     Ductal carcinoma in situ (DCIS) of right breast  02/05/2021 Mammogram   Exam: 3D Mammogram Diagnostic Unilateral Digital - Right  IMPRESSION: The 1.1 cm grouped amorphous round calcifications in the right breast are suspicious.   02/20/2021 Pathology Results   Diagnosis Breast, right, needle core biopsy, right breast calcification 9:00 middle depth 8cmfn - DUCTAL CARCINOMA IN SITU WITH CALCIFICATIONS Microscopic Comment Based on the biopsy, the ductal carcinoma in situ has a cribriform pattern, intermediate nuclear grade and measured 0.3 cm in greatest linear extent.  PROGNOSTIC INDICATORS Results: Estrogen Receptor: >95%, POSITIVE, STRONG STAINING INTENSITY Progesterone Receptor: >95%, POSITIVE, STRONG STAINING INTENSITY    03/02/2021 Initial Diagnosis   Ductal carcinoma in situ (DCIS) of right breast   03/06/2021 Cancer Staging   Staging form: Breast, AJCC 8th Edition - Clinical: Stage 0 (cTis (DCIS), cN0, cM0, ER+, PR+, HER2: Not Assessed) - Signed by Truitt Merle, MD on 03/06/2021 Stage prefix: Initial diagnosis    03/20/2021 Genetic Testing   Negative hereditary cancer genetic testing: no pathogenic variants detected in [facility] CancerNext-Expanded +RNAinsight Panel.  The report date is 03/20/2021.   The CancerNext-Expanded gene panel offered by [facility] and includes sequencing, rearrangement, and RNA analysis for the following 77 genes: AIP, ALK, APC,  ATM, AXIN2, BAP1, BARD1, BLM, BMPR1A, BRCA1, BRCA2, BRIP1, CDC73, CDH1, CDK4, CDKN1B, CDKN2A, CHEK2, CTNNA1,  DICER1, FANCC, FH, FLCN, GALNT12, KIF1B, LZTR1, MAX, MEN1, MET, MLH1, MSH2, MSH3, MSH6, MUTYH, NBN, NF1, NF2, NTHL1, PALB2, PHOX2B, PMS2, POT1, PRKAR1A, PTCH1, PTEN, RAD51C, RAD51D, RB1, RECQL, RET, SDHA, SDHAF2, SDHB, SDHC, SDHD, SMAD4, SMARCA4, SMARCB1, SMARCE1, STK11, SUFU, TMEM127, TP53, TSC1, TSC2, VHL and XRCC2 (sequencing and deletion/duplication); EGFR, EGLN1, HOXB13, KIT, MITF, PDGFRA, POLD1, and POLE (sequencing only); EPCAM and GREM1 (deletion/duplication only).    04/09/2021 Cancer Staging   Staging form: Breast, AJCC 8th Edition - Pathologic stage from 04/09/2021: Stage Unknown (pTis (DCIS), pNX, cM0, G2, ER+, PR+, HER2: Not Assessed) - Signed by Truitt Merle, MD on 06/12/2021 Stage prefix: Initial diagnosis Histologic grading system: 3 grade system Residual tumor (R): R0 - None    04/09/2021 Definitive Surgery   FINAL MICROSCOPIC DIAGNOSIS:   A. BREAST, RIGHT, LUMPECTOMY:  -  Ductal carcinoma in situ (DCIS), nuclear grade II (intermediate),  micropapillary pattern, 2 mm in greatest dimension.  -  Resection edges negative for DCIS.  -  Distance from DCIS to closest resected edge: 1 mm (inferior edge), see Cancer Protocol below for final margin status.  -  Prior biopsy site changes/scar.  -  Fibrocystic changes with cystic apocrine metaplasia/hyperplasia.   B. BREAST, RIGHT ADDITIONAL LATERAL MARGIN, EXCISION:  -  Benign breast tissue.   C. BREAST, RIGHT ADDITIONAL INFERIOR MARGIN, EXCISION:  -  Benign breast tissue.   D. BREAST, RIGHT ADDITIONAL POSTERIOR MARGIN, EXCISION:  -  Benign breast tissue.       INTERVAL HISTORY:  Ann Thornton is here for a follow up of DCIS. She was last seen by me on 03/07/22 in consultation. She presents to the clinic alone. She reports she is tolerating radiation therapy relatively well; she notes her skin has just recently started to darken.   All other systems were reviewed with the patient and are negative.  MEDICAL HISTORY:  Past  Medical History:  Diagnosis Date   Anxiety    Breast cancer ([place])    Family history of adverse reaction to anesthesia    mother hard time waking up after surgery   High blood pressure    Thyroid disease     SURGICAL HISTORY: Past Surgical History:  Procedure Laterality Date   BREAST LUMPECTOMY WITH RADIOACTIVE SEED LOCALIZATION Right 04/09/2021   Procedure: RIGHT BREAST LUMPECTOMY WITH RADIOACTIVE SEED LOCALIZATION;  Surgeon: Rolm Bookbinder, MD;  Location: [place];  Service: General;  Laterality: Right;   EYE SURGERY Right 2009   Tightened muscle of eye    I have reviewed the social history and family history with the patient and they are unchanged from previous note.  ALLERGIES:  has No Known Allergies.  MEDICATIONS:  Current Outpatient Medications  Medication Sig Dispense Refill   amLODipine (NORVASC) 10 MG tablet Take 1 tablet (10 mg total) by mouth daily. 30 tablet 0   atenolol (TENORMIN) 50 MG tablet TAKE 1 TABLET BY MOUTH EVERY DAY 30 tablet 3   ezetimibe (ZETIA) 10 MG tablet Take 1 tablet (10 mg total) by mouth daily. 30 tablet 0   irbesartan (AVAPRO) 150 MG tablet Take 1 tablet (150 mg total) by mouth daily. 30 tablet 0   levothyroxine (SYNTHROID) 50 MCG tablet Take 1 tablet (50 mcg total) by mouth daily. 90 tablet 3   naproxen sodium (ALEVE) 220 MG tablet Take 220 mg by mouth 2 (two) times daily as needed (  As needed but not daily). Pt takes 2 pills for a total of $Remove'440mg'dlrDTRo$      traMADol (ULTRAM) 50 MG tablet Take 2 tablets (100 mg total) by mouth every 6 (six) hours as needed. (Patient not taking: Reported on 05/07/2021) 10 tablet 0   No current facility-administered medications for this visit.    PHYSICAL EXAMINATION: ECOG PERFORMANCE STATUS: 0 - Asymptomatic  Vitals:   06/13/21 1409  BP: 131/86  Pulse: 98  Resp: 18  Temp: 99.1 F (37.3 C)  SpO2: 100%   Wt Readings from Last 3 Encounters:  06/13/21 199 lb 1.6 oz (90.3 kg)  05/07/21 196 lb 4  oz (89 kg)  04/09/21 191 lb 9.3 oz (86.9 kg)     GENERAL:alert, no distress and comfortable SKIN: skin color, texture, turgor are normal, no rashes or significant lesions, (+) skin darkening to right breast from radiation EYES: normal, Conjunctiva are pink and non-injected, sclera clear   NEURO: alert & oriented x 3 with fluent speech, no focal motor/sensory deficits  LABORATORY DATA:  I have reviewed the data as listed CBC Latest Ref Rng & Units 03/07/2021 02/15/2021 05/10/2019  WBC 4.0 - 10.5 K/uL 5.9 8.3 6.9  Hemoglobin 12.0 - 15.0 g/dL 11.2(L) 11.9(L) 12.4  Hematocrit 36.0 - 46.0 % 32.6(L) 36.3 37.1  Platelets 150 - 400 K/uL 357 333 -     CMP Latest Ref Rng & Units 03/07/2021 02/15/2021 08/07/2020  Glucose 70 - 99 mg/dL 110(H) 109(H) 111(H)  BUN 6 - 20 mg/dL $Remove'12 9 10  'dtCaUqk$ Creatinine 0.44 - 1.00 mg/dL 0.72 0.56 0.66  Sodium 135 - 145 mmol/L 138 136 138  Potassium 3.5 - 5.1 mmol/L 3.7 3.8 4.0  Chloride 98 - 111 mmol/L 104 103 102  CO2 22 - 32 mmol/L $RemoveB'24 25 29  'yFuVcrZJ$ Calcium 8.9 - 10.3 mg/dL 9.1 9.4 9.0  Total Protein 6.5 - 8.1 g/dL 7.8 7.5 7.4  Total Bilirubin 0.3 - 1.2 mg/dL 0.4 0.4 0.2  Alkaline Phos 38 - 126 U/L 48 47 57  AST 15 - 41 U/L $Remo'16 19 11  'SxRVA$ ALT 0 - 44 U/L $Remo'15 13 12      'CvvUz$ RADIOGRAPHIC STUDIES: I have personally reviewed the radiological images as listed and agreed with the findings in the report. No results found.    No orders of the defined types were placed in this encounter.  All questions were answered. The patient knows to call the clinic with any problems, questions or concerns. No barriers to learning was detected. The total time spent in the appointment was 30 minutes.     Truitt Merle, MD 06/13/2021   I, Wilburn Mylar, am acting as scribe for Truitt Merle, MD.   I have reviewed the above documentation for accuracy and completeness, and I agree with the above.

## 2021-06-14 ENCOUNTER — Telehealth: Payer: Self-pay | Admitting: Nurse Practitioner

## 2021-06-14 ENCOUNTER — Ambulatory Visit
Admission: RE | Admit: 2021-06-14 | Discharge: 2021-06-14 | Disposition: A | Discharge status: Home / Self Care | Payer: 59 | Source: Ambulatory Visit | Attending: Radiation Oncology | Admitting: Radiation Oncology

## 2021-06-14 ENCOUNTER — Encounter: Payer: Self-pay | Admitting: *Deleted

## 2021-06-14 DIAGNOSIS — Z510 Encounter for antineoplastic radiation therapy: Secondary | ICD-10-CM | POA: Diagnosis not present

## 2021-06-14 DIAGNOSIS — D0511 Intraductal carcinoma in situ of right breast: Secondary | ICD-10-CM

## 2021-06-14 NOTE — Telephone Encounter (Signed)
Scheduled appt per 2/22 los. Pt is aware.  °

## 2021-06-15 ENCOUNTER — Encounter: Payer: Self-pay | Admitting: Radiation Oncology

## 2021-06-15 ENCOUNTER — Ambulatory Visit
Admission: RE | Admit: 2021-06-15 | Discharge: 2021-06-15 | Disposition: A | Discharge status: Home / Self Care | Payer: 59 | Source: Ambulatory Visit | Attending: Radiation Oncology | Admitting: Radiation Oncology

## 2021-06-15 ENCOUNTER — Other Ambulatory Visit: Payer: Self-pay

## 2021-06-15 DIAGNOSIS — Z510 Encounter for antineoplastic radiation therapy: Secondary | ICD-10-CM | POA: Diagnosis not present

## 2021-06-22 ENCOUNTER — Telehealth: Payer: Self-pay

## 2021-06-22 NOTE — Telephone Encounter (Signed)
Notified Patient of completion of FMLA paperwork. Fax transmission confirmation received from [employer] and copy of forms mailed to Patient as requested. No other needs voiced at this time. ?

## 2021-06-25 ENCOUNTER — Encounter: Payer: Self-pay | Admitting: Gastroenterology

## 2021-06-27 ENCOUNTER — Encounter (HOSPITAL_COMMUNITY): Payer: Self-pay

## 2021-06-27 ENCOUNTER — Telehealth: Payer: Self-pay | Admitting: *Deleted

## 2021-06-27 ENCOUNTER — Other Ambulatory Visit: Payer: Self-pay | Admitting: Emergency Medicine

## 2021-06-27 DIAGNOSIS — E785 Hyperlipidemia, unspecified: Secondary | ICD-10-CM

## 2021-06-27 NOTE — Telephone Encounter (Signed)
06/26/2020 11:45 am Late entry. ?"Demetra Shiner with [employer] calling regarding FMLA form for [employer].  Form update needed for duration of four (4) hours.  We have a lot of absences for her greater than  the one (1) hour for treatment appointments per form." ? ? ?

## 2021-07-04 ENCOUNTER — Telehealth: Payer: Self-pay | Admitting: *Deleted

## 2021-07-04 NOTE — Telephone Encounter (Signed)
Direct colonoscopy is OK with me if no other contraindications. ?Thanks. ?GM ?

## 2021-07-04 NOTE — Telephone Encounter (Signed)
Dr Josetta Huddle is scheduled for a direct colon on 4/18. Pt dx with R Breast CA and had lumpectomy in Dec 2022. Last radiation treatment 06/15/21. Ok to proceed with colon here at [facility]? ? ?Thanks, Lattie Haw PV ?

## 2021-07-12 ENCOUNTER — Encounter: Payer: Self-pay | Admitting: Radiology

## 2021-07-19 ENCOUNTER — Other Ambulatory Visit: Payer: Self-pay

## 2021-07-19 ENCOUNTER — Encounter: Payer: Self-pay | Admitting: Radiation Oncology

## 2021-07-19 ENCOUNTER — Ambulatory Visit
Admission: RE | Admit: 2021-07-19 | Discharge: 2021-07-19 | Disposition: A | Discharge status: Home / Self Care | Payer: 59 | Source: Ambulatory Visit | Attending: Radiation Oncology | Admitting: Radiation Oncology

## 2021-07-19 DIAGNOSIS — Z170 Estrogen receptor positive status [ER+]: Secondary | ICD-10-CM | POA: Diagnosis not present

## 2021-07-19 DIAGNOSIS — Z79899 Other long term (current) drug therapy: Secondary | ICD-10-CM | POA: Diagnosis not present

## 2021-07-19 DIAGNOSIS — D0511 Intraductal carcinoma in situ of right breast: Secondary | ICD-10-CM | POA: Insufficient documentation

## 2021-07-19 DIAGNOSIS — Z923 Personal history of irradiation: Secondary | ICD-10-CM | POA: Insufficient documentation

## 2021-07-19 NOTE — Progress Notes (Signed)
?Radiation Oncology         [phone] ?________________________________ ? ?Name: Ann Thornton MRN: [id number]  ?Date: 07/19/2021  DOB: 06/28/1975 ? ?Follow-Up Visit Note ? ?CC: Sagardia, Ines Bloomer, MD  Truitt Merle, MD ? ?  ICD-10-CM   ?1. Ductal carcinoma in situ (DCIS) of right breast  D05.11   ?  ? ? ?Diagnosis:  Status post right lumpectomy:  Stage 0 (cTis (DCIS), cN0, cM0) Right Breast, Intermediate grade ductal carcinoma in-situ, ER+ / PR+ / Her2 not assessed ? ?Interval Since Last Radiation: 1 month and 6 days  ? ?Intent: Curative ? ?Radiation Treatment Dates: 05/21/2021 through 06/15/2021 ?Site Technique Total Dose (Gy) Dose per Fx (Gy) Completed Fx Beam Energies  ?Breast, Right: Breast_R 3D 40.05/40.05 2.67 15/15 6XFFF, 10XFFF  ?Breast, Right: Breast_R_Bst 3D 10/10 2 5/5 6X, 10X  ? ? ?Narrative:  The patient returns today for routine follow-up.  The patient tolerated radiation therapy relatively well. On the date of her final weekly treatment check on 06/12/21, the patient reported right axillary pain, fatigue, and hyperpigmentation changes. Physical exam performed on that same date confirmed hyperpigmentation changes to the right breast area. A small amount of skin breakdown in the inframammary fold was also appreciated, for which I recommended Neosporin ointment.         ? ?In the interval since the patient was seen for consultation, the patient followed up with Dr. Burr Medico on 06/13/21. During which time, the patient reported tolerating RT well other than hyperpigmentation changes as above. Dr. Burr Medico also discussed antiestrogen treatments with the patient including tamoxifen. The patient expressed hesitance in taking tamoxifen due to concerns of side effects, and requested time to consider this further. She will return to Dr. Burr Medico in the near future to discuss this further, and to go over her survivorship care plan.       ? ?Otherwise, no significant interval history since the patient was last  seen. ? ?On evaluation the today the patient is feeling quite well.  She has some mild soreness and swelling along her incisional area.  She reports her skin is healed well.             ? ?Allergies:  has No Known Allergies. ? ?Meds: ?Current Outpatient Medications  ?Medication Sig Dispense Refill  ? amLODipine (NORVASC) 10 MG tablet TAKE 1 TABLET BY MOUTH  DAILY 90 tablet 3  ? atenolol (TENORMIN) 50 MG tablet TAKE 1 TABLET BY MOUTH EVERY DAY 30 tablet 3  ? ezetimibe (ZETIA) 10 MG tablet TAKE 1 TABLET BY MOUTH  DAILY 90 tablet 3  ? irbesartan (AVAPRO) 150 MG tablet TAKE 1 TABLET BY MOUTH  DAILY 90 tablet 3  ? levothyroxine (SYNTHROID) 50 MCG tablet Take 1 tablet (50 mcg total) by mouth daily. 90 tablet 3  ? naproxen sodium (ALEVE) 220 MG tablet Take 220 mg by mouth 2 (two) times daily as needed (As needed but not daily). Pt takes 2 pills for a total of $Remove'440mg'ExJLfQl$     ? traMADol (ULTRAM) 50 MG tablet Take 2 tablets (100 mg total) by mouth every 6 (six) hours as needed. (Patient not taking: Reported on 05/07/2021) 10 tablet 0  ? ?No current facility-administered medications for this encounter.  ? ? ?Physical Findings: ?The patient is in no acute distress. Patient is alert and oriented. ? height is $RemoveB'5\' 4"'UIpvFpNe$  (4.825 m) and weight is 197 lb (89.4 kg). Her temperature is 98.4 ?F (36.9 ?C). Her blood pressure is 126/86 and her  pulse is 100. Her respiration is 18 and oxygen saturation is 100%. .  No significant changes. Lungs are clear to auscultation bilaterally. Heart has regular rate and rhythm. No palpable cervical, supraclavicular, or axillary adenopathy. Abdomen soft, non-tender, normal bowel sounds. ? ?Right Breast: no palpable mass, nipple discharge or bleeding.  Skin is healed well.  Mild hyperpigmentation changes noted.  No significant diffuse swelling within the breast.  Some induration along the lumpectomy scar consistent with surgical effect.  Recommend she do massage to this area to help soften this up.  Examination of  the left breast reveals no palpable mass nipple discharge or bleeding. ? ? ? ?Lab Findings: ?Lab Results  ?Component Value Date  ? WBC 5.9 03/07/2021  ? HGB 11.2 (L) 03/07/2021  ? HCT 32.6 (L) 03/07/2021  ? MCV 95.3 03/07/2021  ? PLT 357 03/07/2021  ? ? ?Radiographic Findings: ?No results found. ? ?Impression: Status post right lumpectomy:  Stage 0 (cTis (DCIS), cN0, cM0) Right Breast, Intermediate grade ductal carcinoma in-situ, ER+ / PR+ / Her2 not assessed ? ?The patient is recovering from the effects of radiation.  Skin is healed well well at this time.  No evidence of recurrence on clinical exam. ? ?Plan: As needed follow-up in radiation oncology.  Patient will continue follow-up with medical oncology and will consider tamoxifen in the near future.  She will continue to follow-up with her surgeon on a regular basis in addition. ? ? ? ?____________________________________ ? ?Blair Promise, PhD, MD ? ? ?This document serves as a record of services personally performed by Gery Pray, MD. It was created on his behalf by Roney Mans, a trained medical scribe. The creation of this record is based on the scribe's personal observations and the provider's statements to them. This document has been checked and approved by the attending provider. ? ?

## 2021-07-19 NOTE — Progress Notes (Signed)
Verner L Pierron is here today for follow up post radiation to the breast. ? ? Breast Side:right ? ? ?They completed their radiation on: 06/15/2021  ? ?Does the patient complain of any of the following: ?Post radiation skin issues:  Patient reports major improvement to skin, continues to have itching to surgical incision.  ?Breast Tenderness: Yes ?Breast Swelling:Yes, to surgical incision. ?Lymphadema: No ?Range of Motion limitations: no, patient demonstrates full range of motion .  ?Fatigue post radiation: yes ?Appetite good/fair/poor: Good ? ?Additional comments if applicable: Patient reports having a throbbing sensation to right breast at times that does not last long. ? ?Vitals:  ? 07/19/21 1542  ?BP: 126/86  ?Pulse: 100  ?Resp: 18  ?Temp: 98.4 ?F (36.9 ?C)  ?SpO2: 100%  ?Weight: 197 lb (89.4 kg)  ?Height: '5\' 4"'$  (1.626 m)  ?  ?

## 2021-07-23 ENCOUNTER — Ambulatory Visit (AMBULATORY_SURGERY_CENTER): Payer: 59

## 2021-07-23 VITALS — Ht 64.0 in | Wt 196.0 lb

## 2021-07-23 DIAGNOSIS — Z1211 Encounter for screening for malignant neoplasm of colon: Secondary | ICD-10-CM

## 2021-07-23 MED ORDER — PEG 3350-KCL-NA BICARB-NACL 420 G PO SOLR: 4000.0000 mL | Freq: Once | ORAL | 0 refills | Status: AC

## 2021-07-23 NOTE — Progress Notes (Signed)
No egg or soy allergy known to patient  ?No issues known to pt with past sedation with any surgeries or procedures ?Patient denies ever being told they had issues or difficulty with intubation  ?No FH of Malignant Hyperthermia ?Pt is not on diet pills ?Pt is not on  home 02  ?Pt is not on blood thinners  ?Pt denies issues with constipation;  ?No A fib or A flutter ?NO PA's for preps discussed with pt in PV today  ?Discussed with pt there will be an out-of-pocket cost for prep and that varies from $0 to 70 + dollars - pt verbalized understanding  ?Due to the COVID-19 pandemic we are asking patients to follow certain guidelines in PV and the [place]   ?Pt aware of COVID protocols and [facility] guidelines  ?PV completed over the phone. Pt verified name, DOB, address and insurance during PV today.  ?Pt mailed instruction packet with copy of consent form to read and not return, and instructions.  ?Pt encouraged to call with questions or issues.  ?If pt has My chart, procedure instructions sent via My Chart  ? ?

## 2021-07-26 ENCOUNTER — Encounter: Payer: Self-pay | Admitting: Gastroenterology

## 2021-08-07 ENCOUNTER — Ambulatory Visit (AMBULATORY_SURGERY_CENTER): Payer: 59 | Admitting: Gastroenterology

## 2021-08-07 ENCOUNTER — Encounter: Payer: Self-pay | Admitting: Gastroenterology

## 2021-08-07 VITALS — BP 123/83 | HR 80 | Temp 98.7°F | Resp 22 | Ht 64.0 in | Wt 196.0 lb

## 2021-08-07 DIAGNOSIS — D120 Benign neoplasm of cecum: Secondary | ICD-10-CM

## 2021-08-07 DIAGNOSIS — Z1211 Encounter for screening for malignant neoplasm of colon: Secondary | ICD-10-CM

## 2021-08-07 DIAGNOSIS — D128 Benign neoplasm of rectum: Secondary | ICD-10-CM

## 2021-08-07 MED ORDER — SODIUM CHLORIDE 0.9 % IV SOLN: 500.0000 mL | Freq: Once | INTRAVENOUS | Status: DC

## 2021-08-07 NOTE — Patient Instructions (Signed)
Handout on polyps and diverticulosis given. Use Fiber- Con 1-2 daily. ? ? ?YOU HAD AN ENDOSCOPIC PROCEDURE TODAY AT [facility]:   Refer to the procedure report that was given to you for any specific questions about what was found during the examination.  If the procedure report does not answer your questions, please call your gastroenterologist to clarify.  If you requested that your care partner not be given the details of your procedure findings, then the procedure report has been included in a sealed envelope for you to review at your convenience later. ? ?YOU SHOULD EXPECT: Some feelings of bloating in the abdomen. Passage of more gas than usual.  Walking can help get rid of the air that was put into your GI tract during the procedure and reduce the bloating. If you had a lower endoscopy (such as a colonoscopy or flexible sigmoidoscopy) you may notice spotting of blood in your stool or on the toilet paper. If you underwent a bowel prep for your procedure, you may not have a normal bowel movement for a few days. ? ?Please Note:  You might notice some irritation and congestion in your nose or some drainage.  This is from the oxygen used during your procedure.  There is no need for concern and it should clear up in a day or so. ? ?SYMPTOMS TO REPORT IMMEDIATELY: ? ?Following lower endoscopy (colonoscopy or flexible sigmoidoscopy): ? Excessive amounts of blood in the stool ? Significant tenderness or worsening of abdominal pains ? Swelling of the abdomen that is new, acute ? Fever of 100?F or higher ? ? ?For urgent or emergent issues, a gastroenterologist can be reached at any hour by calling [phone]. ?Do not use MyChart messaging for urgent concerns.  ? ? ?DIET:  We do recommend a small meal at first, but then you may proceed to your regular diet.  Drink plenty of fluids but you should avoid alcoholic beverages for 24 hours. ? ?ACTIVITY:  You should plan to take it easy for the rest of  today and you should NOT DRIVE or use heavy machinery until tomorrow (because of the sedation medicines used during the test).   ? ?FOLLOW UP: ?Our staff will call the number listed on your records 48-72 hours following your procedure to check on you and address any questions or concerns that you may have regarding the information given to you following your procedure. If we do not reach you, we will leave a message.  We will attempt to reach you two times.  During this call, we will ask if you have developed any symptoms of COVID 19. If you develop any symptoms (ie: fever, flu-like symptoms, shortness of breath, cough etc.) before then, please call [phone].  If you test positive for Covid 19 in the 2 weeks post procedure, please call and report this information to [place].   ? ?If any biopsies were taken you will be contacted by phone or by letter within the next 1-3 weeks.  Please call us at [phone] if you have not heard about the biopsies in 3 weeks.  ? ? ?SIGNATURES/CONFIDENTIALITY: ?You and/or your care partner have signed paperwork which will be entered into your electronic medical record.  These signatures attest to the fact that that the information above on your After Visit Summary has been reviewed and is understood.  Full responsibility of the confidentiality of this discharge information lies with you and/or your care-partner.  ?

## 2021-08-07 NOTE — Progress Notes (Signed)
? ?GASTROENTEROLOGY PROCEDURE H&P NOTE  ? ?Primary Care Physician: ?Horald Pollen, MD ? ?HPI: ?Ann Thornton is a 46 y.o. female who presents for Colonoscopy for screening. ? ?Past Medical History:  ?Diagnosis Date  ? Anxiety   ? not on meds-hx of  ? Breast cancer ([place])   ? pre cancerous cells-radiation  ? Family history of adverse reaction to anesthesia   ? mother hard time waking up after surgery  ? High blood pressure   ? on meds  ? History of radiation therapy   ? right breast 05/21/2021-06/15/2021  Dr Gery Pray  ? Hyperlipidemia   ? on meds  ? Thyroid disease   ? on meds  ? ?Past Surgical History:  ?Procedure Laterality Date  ? BREAST LUMPECTOMY WITH RADIOACTIVE SEED LOCALIZATION Right 04/09/2021  ? Procedure: RIGHT BREAST LUMPECTOMY WITH RADIOACTIVE SEED LOCALIZATION;  Surgeon: Rolm Bookbinder, MD;  Location: [place];  Service: General;  Laterality: Right;  ? EYE SURGERY Right 2009  ? Tightened muscle of eye  ? ?Current Outpatient Medications  ?Medication Sig Dispense Refill  ? amLODipine (NORVASC) 10 MG tablet TAKE 1 TABLET BY MOUTH  DAILY 90 tablet 3  ? atenolol (TENORMIN) 50 MG tablet TAKE 1 TABLET BY MOUTH EVERY DAY 30 tablet 3  ? ezetimibe (ZETIA) 10 MG tablet TAKE 1 TABLET BY MOUTH  DAILY 90 tablet 3  ? irbesartan (AVAPRO) 150 MG tablet TAKE 1 TABLET BY MOUTH  DAILY 90 tablet 3  ? levothyroxine (SYNTHROID) 50 MCG tablet Take 1 tablet (50 mcg total) by mouth daily. 90 tablet 3  ? naproxen sodium (ALEVE) 220 MG tablet Take 220 mg by mouth 2 (two) times daily as needed (As needed but not daily). Pt takes 2 pills for a total of '440mg'$     ? ?Current Facility-Administered Medications  ?Medication Dose Route Frequency Provider Last Rate Last Admin  ? 0.9 %  sodium chloride infusion  500 mL Intravenous Once Mansouraty, Telford Nab., MD      ? ? ?Current Outpatient Medications:  ?  amLODipine (NORVASC) 10 MG tablet, TAKE 1 TABLET BY MOUTH  DAILY, Disp: 90 tablet, Rfl: 3 ?   atenolol (TENORMIN) 50 MG tablet, TAKE 1 TABLET BY MOUTH EVERY DAY, Disp: 30 tablet, Rfl: 3 ?  ezetimibe (ZETIA) 10 MG tablet, TAKE 1 TABLET BY MOUTH  DAILY, Disp: 90 tablet, Rfl: 3 ?  irbesartan (AVAPRO) 150 MG tablet, TAKE 1 TABLET BY MOUTH  DAILY, Disp: 90 tablet, Rfl: 3 ?  levothyroxine (SYNTHROID) 50 MCG tablet, Take 1 tablet (50 mcg total) by mouth daily., Disp: 90 tablet, Rfl: 3 ?  naproxen sodium (ALEVE) 220 MG tablet, Take 220 mg by mouth 2 (two) times daily as needed (As needed but not daily). Pt takes 2 pills for a total of '440mg'$ , Disp: , Rfl:  ? ?Current Facility-Administered Medications:  ?  0.9 %  sodium chloride infusion, 500 mL, Intravenous, Once, Mansouraty, Telford Nab., MD ?No Known Allergies ?Family History  ?Problem Relation Age of Onset  ? Hypertension Mother   ? Stroke Mother   ? Thyroid disease Sister   ? Throat cancer Maternal Aunt 61  ? Lung cancer Maternal Uncle   ?     dx after 40; x2 maternal uncles  ? Bone cancer Maternal Grandmother   ?     dx after 50  ? Colon polyps Neg Hx   ? Colon cancer Neg Hx   ? Esophageal cancer Neg Hx   ?  Stomach cancer Neg Hx   ? Rectal cancer Neg Hx   ? ?Social History  ? ?Socioeconomic History  ? Marital status: Widowed  ?  Spouse name: Not on file  ? Number of children: 0  ? Years of education: Not on file  ? Highest education level: Not on file  ?Occupational History  ? Occupation: customer service  ?  Employer: [employer]  ?Tobacco Use  ? Smoking status: Never  ? Smokeless tobacco: Never  ?Vaping Use  ? Vaping Use: Never used  ?Substance and Sexual Activity  ? Alcohol use: Not Currently  ?  Alcohol/week: 7.0 standard drinks  ?  Types: 7 Standard drinks or equivalent per week  ?  Comment: 1 beer daily  ? Drug use: Yes  ?  Frequency: 3.0 times per week  ?  Types: Marijuana  ? Sexual activity: Yes  ?  Partners: Male  ?  Birth control/protection: None  ?Other Topics Concern  ? Not on file  ?Social History Narrative  ? Single without children  ?  Working from home  ? Exercise - zumba once a week, fitness workout on Mondays  ? Diet - trying to be good, eating no salt.   ?   ?   ? As of 11/08/15:  ?   ? Diet: N/A  ? Caffeine: No Sodas  ? Married: Engaged  ? House: Lives in apartment, 2 stories, 1 person   ? Pets: None  ? 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 Exercise: No   ? Living Will: No   ? DNR: No   ? POA/HPOA: No   ?   ? ?Social Determinants of Health  ? ?Financial Resource Strain: Low Risk   ? Difficulty of Paying Living Expenses: Not very hard  ?Food Insecurity: No Food Insecurity  ? Worried About Charity fundraiser in the Last Year: Never true  ? Ran Out of Food in the Last Year: Never true  ?Transportation Needs: No Transportation Needs  ? Lack of Transportation (Medical): No  ? Lack of Transportation (Non-Medical): No  ?Physical Activity: Not on file  ?Stress: Not on file  ?Social Connections: Not on file  ?Intimate Partner Violence: Not on file  ? ? ?Physical Exam: ?Today's Vitals  ? 08/07/21 0949 08/07/21 0950  ?BP: 132/76   ?Pulse: 85   ?Temp: 98.7 ?F (37.1 ?C) 98.7 ?F (37.1 ?C)  ?SpO2: 99%   ?Weight: 196 lb (88.9 kg)   ?Height: '5\' 4"'$  (1.626 m)   ? ?Body mass index is 33.64 kg/m?. ?GEN: NAD ?EYE: Sclerae anicteric ?ENT: MMM ?CV: Non-tachycardic ?GI: Soft, NT/ND ?NEURO:  Alert & Oriented x 3 ? ?Lab Results: ?No results for input(s): WBC, HGB, HCT, PLT in the last 72 hours. ?BMET ?No results for input(s): NA, K, CL, CO2, GLUCOSE, BUN, CREATININE, CALCIUM in the last 72 hours. ?LFT ?No results for input(s): PROT, ALBUMIN, AST, ALT, ALKPHOS, BILITOT, BILIDIR, IBILI in the last 72 hours. ?PT/INR ?No results for input(s): LABPROT, INR in the last 72 hours. ? ? ?Impression / Plan: ?This is a 46 y.o.female who presents for Colonoscopy for screening. ? ?The risks and benefits of endoscopic evaluation/treatment were discussed with the patient and/or family; these include but are not limited to the risk of perforation, infection, bleeding, missed lesions,  lack of diagnosis, severe illness requiring hospitalization, as well as anesthesia and sedation related illnesses.  The patient's history has been reviewed, patient examined, no change in status, and deemed  stable for procedure.  The patient and/or family is agreeable to proceed.  ? ? ?Justice Britain, MD ?[facility] ?Advanced Endoscopy ?Office # [phone] ? ?

## 2021-08-07 NOTE — Progress Notes (Signed)
Called to room to assist during endoscopic procedure.  Patient ID and intended procedure confirmed with present staff. Received instructions for my participation in the procedure from the performing physician.  

## 2021-08-07 NOTE — Op Note (Signed)
[place] ?Patient Name: Ann Thornton ?Procedure Date: 08/07/2021 10:31 AM ?MRN: [id number] ?Endoscopist: Justice Britain , MD ?Age: 46 ?Referring MD:  ?Date of Birth: May 18, 1975 ?Gender: Female ?Account #: [id number] ?Procedure:                Colonoscopy ?Indications:              Screening for colorectal malignant neoplasm, This  ?                          is the patient's first colonoscopy ?Medicines:                Monitored Anesthesia Care ?Procedure:                Pre-Anesthesia Assessment: ?                          - Prior to the procedure, a History and Physical  ?                          was performed, and patient medications and  ?                          allergies were reviewed. The patient's tolerance of  ?                          previous anesthesia was also reviewed. The risks  ?                          and benefits of the procedure and the sedation  ?                          options and risks were discussed with the patient.  ?                          All questions were answered, and informed consent  ?                          was obtained. Prior Anticoagulants: The patient has  ?                          taken no previous anticoagulant or antiplatelet  ?                          agents except for NSAID medication. ASA Grade  ?                          Assessment: II - A patient with mild systemic  ?                          disease. After reviewing the risks and benefits,  ?                          the patient was deemed in satisfactory condition to  ?  undergo the procedure. ?                          After obtaining informed consent, the colonoscope  ?                          was passed under direct vision. Throughout the  ?                          procedure, the patient's blood pressure, pulse, and  ?                          oxygen saturations were monitored continuously. The  ?                          Olympus CF-[id number] (#[id number]) Colonoscope  was  ?                          introduced through the anus and advanced to the 5  ?                          cm into the ileum. The colonoscopy was performed  ?                          without difficulty. The patient tolerated the  ?                          procedure. The quality of the bowel preparation was  ?                          good. The terminal ileum, ileocecal valve,  ?                          appendiceal orifice, and rectum were photographed. ?Scope In: 10:37:40 AM ?Scope Out: 10:48:57 AM ?Scope Withdrawal Time: 0 hours 8 minutes 49 seconds  ?Total Procedure Duration: 0 hours 11 minutes 17 seconds  ?Findings:                 The digital rectal exam findings include  ?                          hemorrhoids. Pertinent negatives include no  ?                          palpable rectal lesions. ?                          The terminal ileum and ileocecal valve appeared  ?                          normal. ?                          A 4 mm polyp was found in the rectum. The polyp was  ?  sessile. The polyp was removed with a cold snare.  ?                          Resection and retrieval were complete. ?                          Multiple small-mouthed diverticula were found in  ?                          the right colon. ?                          Normal mucosa was found in the entire colon  ?                          otherwise. ?                          Non-bleeding non-thrombosed external and internal  ?                          hemorrhoids were found during retroflexion, during  ?                          perianal exam and during digital exam. The  ?                          hemorrhoids were Grade II (internal hemorrhoids  ?                          that prolapse but reduce spontaneously). ?Complications:            No immediate complications. ?Estimated Blood Loss:     Estimated blood loss was minimal. ?Impression:               - Hemorrhoids found on digital rectal exam. ?                           - The examined portion of the ileum was normal. ?                          - One 4 mm polyp in the rectum, removed with a cold  ?                          snare. Resected and retrieved. ?                          - Diverticulosis in the right colon. ?                          - Normal mucosa in the entire examined colon  ?                          otherwise. ?                          -  Non-bleeding non-thrombosed external and internal  ?                          hemorrhoids. ?Recommendation:           - The patient will be observed post-procedure,  ?                          until all discharge criteria are met. ?                          - Discharge patient to home. ?                          - Patient has a contact number available for  ?                          emergencies. The signs and symptoms of potential  ?                          delayed complications were discussed with the  ?                          patient. Return to normal activities tomorrow.  ?                          Written discharge instructions were provided to the  ?                          patient. ?                          - High fiber diet. ?                          - Use FiberCon 1-2 tablets PO daily. ?                          - Continue present medications. ?                          - Await pathology results. ?                          - Repeat colonoscopy in 5-10 years for surveillance  ?                          based on pathology results. ?                          - The findings and recommendations were discussed  ?                          with the patient. ?                          - The findings and recommendations were discussed  ?  with the patient's family. ?Justice Britain, MD ?08/07/2021 10:53:19 AM ?

## 2021-08-07 NOTE — Progress Notes (Signed)
VS-CW  Pt's states no medical or surgical changes since previsit or office visit.  

## 2021-08-07 NOTE — Progress Notes (Signed)
VSS, transported to PACU °

## 2021-08-09 ENCOUNTER — Telehealth: Payer: Self-pay | Admitting: *Deleted

## 2021-08-09 NOTE — Telephone Encounter (Signed)
No answer on first attempt follow up call. Left message.  ?

## 2021-08-13 ENCOUNTER — Encounter: Payer: Self-pay | Admitting: Gastroenterology

## 2021-08-19 ENCOUNTER — Other Ambulatory Visit: Payer: Self-pay | Admitting: Emergency Medicine

## 2021-08-19 DIAGNOSIS — I10 Essential (primary) hypertension: Secondary | ICD-10-CM

## 2021-09-04 ENCOUNTER — Encounter: Payer: Self-pay | Admitting: Emergency Medicine

## 2021-09-04 ENCOUNTER — Ambulatory Visit (INDEPENDENT_AMBULATORY_CARE_PROVIDER_SITE_OTHER): Payer: 59 | Admitting: Emergency Medicine

## 2021-09-04 DIAGNOSIS — R7303 Prediabetes: Secondary | ICD-10-CM

## 2021-09-04 DIAGNOSIS — E039 Hypothyroidism, unspecified: Secondary | ICD-10-CM | POA: Diagnosis not present

## 2021-09-04 DIAGNOSIS — E785 Hyperlipidemia, unspecified: Secondary | ICD-10-CM | POA: Diagnosis not present

## 2021-09-04 DIAGNOSIS — I10 Essential (primary) hypertension: Secondary | ICD-10-CM

## 2021-09-04 DIAGNOSIS — D0511 Intraductal carcinoma in situ of right breast: Secondary | ICD-10-CM | POA: Diagnosis not present

## 2021-09-04 MED ORDER — LEVOTHYROXINE SODIUM 50 MCG PO TABS: 50.0000 ug | ORAL_TABLET | Freq: Every day | ORAL | 3 refills | Status: DC

## 2021-09-04 NOTE — Progress Notes (Signed)
Ann Thornton ?46 y.o. ? ? ?Chief Complaint  ?Patient presents with  ?? Follow-up  ?  No concerns  ? ? ?HISTORY OF PRESENT ILLNESS: ?This is a 46 y.o. female here for follow-up of several chronic medical problems. ?1.  Hypertension, on medications and well-controlled ?2.  History of breast cancer.  Cancer free ?3.  Dyslipidemia, on medication ?4.  Hypothyroidism.  On Synthroid 50 mcg daily.  Needs medication refill. ?Overall doing well.  Has no complaints or medical concerns today. ?BP Readings from Last 3 Encounters:  ?09/04/21 122/84  ?08/07/21 123/83  ?07/19/21 126/86  ? ?Wt Readings from Last 3 Encounters:  ?09/04/21 199 lb 4 oz (90.4 kg)  ?08/07/21 196 lb (88.9 kg)  ?07/23/21 196 lb (88.9 kg)  ? ? ? ?HPI ? ? ?Prior to Admission medications   ?Medication Sig Start Date End Date Taking? Authorizing Provider  ?amLODipine (NORVASC) 10 MG tablet TAKE 1 TABLET BY MOUTH  DAILY 06/28/21  Yes Ancel Easler, Ines Bloomer, MD  ?atenolol (TENORMIN) 50 MG tablet TAKE 1 TABLET(50 MG) BY MOUTH DAILY 08/20/21  Yes Simran Bomkamp, Ines Bloomer, MD  ?ezetimibe (ZETIA) 10 MG tablet TAKE 1 TABLET BY MOUTH  DAILY 06/28/21  Yes Daegon Deiss, Ines Bloomer, MD  ?irbesartan (AVAPRO) 150 MG tablet TAKE 1 TABLET BY MOUTH  DAILY 06/28/21  Yes Lavere Shinsky, Ines Bloomer, MD  ?levothyroxine (SYNTHROID) 50 MCG tablet Take 1 tablet (50 mcg total) by mouth daily. 03/05/21  Yes SagardiaInes Bloomer, MD  ?naproxen sodium (ALEVE) 220 MG tablet Take 220 mg by mouth 2 (two) times daily as needed (As needed but not daily). Pt takes 2 pills for a total of '440mg'$    Yes [provider]  ? ? ?No Known Allergies ? ?Patient Active Problem List  ? Diagnosis Date Noted  ?? Genetic testing 03/20/2021  ?? Ductal carcinoma in situ (DCIS) of right breast 03/02/2021  ?? COVID-19 10/27/2020  ?? Hypothyroidism 08/07/2020  ?? Dyslipidemia 12/08/2019  ?? Prediabetes 12/08/2019  ?? Abnormal thyroid blood test 12/08/2019  ?? Mixed hyperlipidemia 07/07/2018  ?? Essential hypertension  03/23/2015  ?? Anxiety disorder 06/02/2014  ? ? ?Past Medical History:  ?Diagnosis Date  ?? Anxiety   ? not on meds-hx of  ?? Breast cancer ([place])   ? pre cancerous cells-radiation  ?? Family history of adverse reaction to anesthesia   ? mother hard time waking up after surgery  ?? High blood pressure   ? on meds  ?? History of radiation therapy   ? right breast 05/21/2021-06/15/2021  Dr Gery Pray  ?? Hyperlipidemia   ? on meds  ?? Thyroid disease   ? on meds  ? ? ?Past Surgical History:  ?Procedure Laterality Date  ?? BREAST LUMPECTOMY WITH RADIOACTIVE SEED LOCALIZATION Right 04/09/2021  ? Procedure: RIGHT BREAST LUMPECTOMY WITH RADIOACTIVE SEED LOCALIZATION;  Surgeon: Rolm Bookbinder, MD;  Location: [place];  Service: General;  Laterality: Right;  ?? EYE SURGERY Right 2009  ? Tightened muscle of eye  ? ? ?Social History  ? ?Socioeconomic History  ?? Marital status: Widowed  ?  Spouse name: Not on file  ?? Number of children: 0  ?? Years of education: Not on file  ?? Highest education level: Not on file  ?Occupational History  ?? Occupation: customer service  ?  Employer: [employer]  ?Tobacco Use  ?? Smoking status: Never  ?? Smokeless tobacco: Never  ?Vaping Use  ?? Vaping Use: Never used  ?Substance and Sexual Activity  ??  Alcohol use: Not Currently  ?  Alcohol/week: 7.0 standard drinks  ?  Types: 7 Standard drinks or equivalent per week  ?  Comment: 1 beer daily  ?? Drug use: Yes  ?  Frequency: 3.0 times per week  ?  Types: Marijuana  ?? Sexual activity: Yes  ?  Partners: Male  ?  Birth control/protection: None  ?Other Topics Concern  ?? Not on file  ?Social History Narrative  ? Single without children  ? Working from home  ? Exercise - zumba once a week, fitness workout on Mondays  ? Diet - trying to be good, eating no salt.   ?   ?   ? As of 11/08/15:  ?   ? Diet: N/A  ? Caffeine: No Sodas  ? Married: Engaged  ? House: Lives in apartment, 2 stories, 1 person   ? Pets: None  ?  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 Exercise: No   ? Living Will: No   ? DNR: No   ? POA/HPOA: No   ?   ? ?Social Determinants of Health  ? ?Financial Resource Strain: Low Risk   ?? Difficulty of Paying Living Expenses: Not very hard  ?Food Insecurity: No Food Insecurity  ?? Worried About Charity fundraiser in the Last Year: Never true  ?? Ran Out of Food in the Last Year: Never true  ?Transportation Needs: No Transportation Needs  ?? Lack of Transportation (Medical): No  ?? Lack of Transportation (Non-Medical): No  ?Physical Activity: Not on file  ?Stress: Not on file  ?Social Connections: Not on file  ?Intimate Partner Violence: Not on file  ? ? ?Family History  ?Problem Relation Age of Onset  ?? Hypertension Mother   ?? Stroke Mother   ?? Thyroid disease Sister   ?? Throat cancer Maternal Aunt 61  ?? Lung cancer Maternal Uncle   ?     dx after 81; x2 maternal uncles  ?? Bone cancer Maternal Grandmother   ?     dx after 50  ?? Colon polyps Neg Hx   ?? Colon cancer Neg Hx   ?? Esophageal cancer Neg Hx   ?? Stomach cancer Neg Hx   ?? Rectal cancer Neg Hx   ? ? ? ?Review of Systems  ?Constitutional: Negative.  Negative for chills and fever.  ?HENT: Negative.  Negative for congestion and sore throat.   ?Respiratory: Negative.  Negative for cough and shortness of breath.   ?Cardiovascular: Negative.  Negative for chest pain and palpitations.  ?Gastrointestinal: Negative.  Negative for abdominal pain, diarrhea, nausea and vomiting.  ?Genitourinary: Negative.  Negative for dysuria and hematuria.  ?Skin: Negative.  Negative for rash.  ?Neurological: Negative.  Negative for dizziness and headaches.  ?All other systems reviewed and are negative. ?Today's Vitals  ? 09/04/21 0953  ?BP: 122/84  ?Pulse: 100  ?Temp: 98.5 ?F (36.9 ?C)  ?SpO2: 98%  ?Weight: 199 lb 4 oz (90.4 kg)  ?Height: '5\' 4"'$  (1.626 m)  ? ?Body mass index is 34.2 kg/m?. ? ? ?Physical Exam ?Vitals reviewed.  ?Constitutional:   ?   Appearance: Normal  appearance.  ?HENT:  ?   Head: Normocephalic.  ?Eyes:  ?   Extraocular Movements: Extraocular movements intact.  ?   Conjunctiva/sclera: Conjunctivae normal.  ?   Pupils: Pupils are equal, round, and reactive to light.  ?Cardiovascular:  ?   Rate and Rhythm: Normal rate and regular rhythm.  ?   Pulses:  Normal pulses.  ?   Heart sounds: Normal heart sounds.  ?Pulmonary:  ?   Effort: Pulmonary effort is normal.  ?   Breath sounds: Normal breath sounds.  ?Musculoskeletal:     ?   General: Normal range of motion.  ?   Cervical back: No tenderness.  ?Lymphadenopathy:  ?   Cervical: No cervical adenopathy.  ?Skin: ?   Capillary Refill: Capillary refill takes less than 2 seconds.  ?Neurological:  ?   General: No focal deficit present.  ?   Mental Status: She is alert and oriented to person, place, and time.  ?Psychiatric:     ?   Mood and Affect: Mood normal.     ?   Behavior: Behavior normal.  ? ? ? ?ASSESSMENT & PLAN: ?A total of 45 minutes was spent with the patient and counseling/coordination of care regarding preparing for this visit, review of most recent office visit notes, review of all medications, review of multiple chronic medical problems and their management, most recent blood work results, education on nutrition, cardiovascular risks associated with hypertension and dyslipidemia, review of health maintenance items, EtOH counseling, prognosis, documentation and need for follow-up. ? ?Problem List Items Addressed This Visit   ? ?  ? Cardiovascular and Mediastinum  ? Essential hypertension  ?  Well-controlled hypertension.  Continue irbesartan 150 mg daily, amlodipine 10 mg daily and atenolol 50 mg daily. ?Dietary approaches to stop hypertension discussed. ?Cardiovascular risks associated with hypertension discussed. ?Follow-up in 6 months. ? ?  ?  ?  ? Endocrine  ? Hypothyroidism  ?  Clinically euthyroid.  Continue Synthroid 50 mcg daily. ? ?  ?  ? Relevant Medications  ? levothyroxine (SYNTHROID) 50 MCG tablet   ?  ? Other  ? Dyslipidemia  ?  Stable.  Diet and nutrition discussed. ?Continue Zetia 10 mg daily. ?The 10-year ASCVD risk score (Arnett DK, et al., 2019) is: 1.4% ?  Values used to calculate the score: ?    Age: 65

## 2021-09-04 NOTE — Assessment & Plan Note (Signed)
Clinically euthyroid. Continue Synthroid 50 mcg daily. 

## 2021-09-04 NOTE — Assessment & Plan Note (Signed)
Stable.  Sees oncologist on a regular basis.  Told she is cancer free. ?

## 2021-09-04 NOTE — Assessment & Plan Note (Signed)
Diet and nutrition discussed.  Advised to decrease daily caloric intake as well as daily carbohydrate intake. ?

## 2021-09-04 NOTE — Assessment & Plan Note (Signed)
Well-controlled hypertension.  Continue irbesartan 150 mg daily, amlodipine 10 mg daily and atenolol 50 mg daily. ?Dietary approaches to stop hypertension discussed. ?Cardiovascular risks associated with hypertension discussed. ?Follow-up in 6 months. ?

## 2021-09-04 NOTE — Patient Instructions (Signed)

## 2021-09-04 NOTE — Assessment & Plan Note (Signed)
Stable.  Diet and nutrition discussed. ?Continue Zetia 10 mg daily. ?The 10-year ASCVD risk score (Arnett DK, et al., 2019) is: 1.4% ?  Values used to calculate the score: ?    Age: 46 years ?    Sex: Female ?    Is Non-Hispanic African American: Yes ?    Diabetic: No ?    Tobacco smoker: No ?    Systolic Blood Pressure: 951 mmHg ?    Is BP treated: Yes ?    HDL Cholesterol: 65.4 mg/dL ?    Total Cholesterol: 216 mg/dL ? ?

## 2021-10-12 ENCOUNTER — Telehealth: Payer: Self-pay | Admitting: *Deleted

## 2021-10-14 NOTE — Progress Notes (Deleted)
CLINIC:  Survivorship   Patient Care Team: Horald Pollen, MD as PCP - General (Internal Medicine) Mauro Kaufmann, RN as Oncology Nurse Navigator Rockwell Germany, RN as Oncology Nurse Navigator Rolm Bookbinder, MD as Consulting Physician (General Surgery) Truitt Merle, MD as Consulting Physician (Hematology) Gery Pray, MD as Consulting Physician (Radiation Oncology) Alla Feeling, NP as Nurse Practitioner (Nurse Practitioner)   REASON FOR VISIT:  Routine follow-up post-treatment for a recent history of breast cancer.  BRIEF ONCOLOGIC HISTORY:  Oncology History Overview Note   Cancer Staging  Ductal carcinoma in situ (DCIS) of right breast Staging form: Breast, AJCC 8th Edition - Clinical: Stage 0 (cTis (DCIS), cN0, cM0, ER+, PR+, HER2: Not Assessed) - Signed by Truitt Merle, MD on 03/06/2021 - Pathologic stage from 04/09/2021: Stage Unknown (pTis (DCIS), pNX, cM0, G2, ER+, PR+, HER2: Not Assessed) - Signed by Truitt Merle, MD on 06/12/2021     Ductal carcinoma in situ (DCIS) of right breast  02/05/2021 Mammogram   Exam: 3D Mammogram Diagnostic Unilateral Digital - Right  IMPRESSION: The 1.1 cm grouped amorphous round calcifications in the right breast are suspicious.   02/20/2021 Pathology Results   Diagnosis Breast, right, needle core biopsy, right breast calcification 9:00 middle depth 8cmfn - DUCTAL CARCINOMA IN SITU WITH CALCIFICATIONS Microscopic Comment Based on the biopsy, the ductal carcinoma in situ has a cribriform pattern, intermediate nuclear grade and measured 0.3 cm in greatest linear extent.  PROGNOSTIC INDICATORS Results: Estrogen Receptor: >95%, POSITIVE, STRONG STAINING INTENSITY Progesterone Receptor: >95%, POSITIVE, STRONG STAINING INTENSITY    03/02/2021 Initial Diagnosis   Ductal carcinoma in situ (DCIS) of right breast   03/06/2021 Cancer Staging   Staging form: Breast, AJCC 8th Edition - Clinical: Stage 0 (cTis (DCIS), cN0, cM0, ER+,  PR+, HER2: Not Assessed) - Signed by Truitt Merle, MD on 03/06/2021 Stage prefix: Initial diagnosis   03/20/2021 Genetic Testing   Negative hereditary cancer genetic testing: no pathogenic variants detected in [facility] CancerNext-Expanded +RNAinsight Panel.  The report date is 03/20/2021.   The CancerNext-Expanded gene panel offered by [facility] and includes sequencing, rearrangement, and RNA analysis for the following 77 genes: AIP, ALK, APC, ATM, AXIN2, BAP1, BARD1, BLM, BMPR1A, BRCA1, BRCA2, BRIP1, CDC73, CDH1, CDK4, CDKN1B, CDKN2A, CHEK2, CTNNA1, DICER1, FANCC, FH, FLCN, GALNT12, KIF1B, LZTR1, MAX, MEN1, MET, MLH1, MSH2, MSH3, MSH6, MUTYH, NBN, NF1, NF2, NTHL1, PALB2, PHOX2B, PMS2, POT1, PRKAR1A, PTCH1, PTEN, RAD51C, RAD51D, RB1, RECQL, RET, SDHA, SDHAF2, SDHB, SDHC, SDHD, SMAD4, SMARCA4, SMARCB1, SMARCE1, STK11, SUFU, TMEM127, TP53, TSC1, TSC2, VHL and XRCC2 (sequencing and deletion/duplication); EGFR, EGLN1, HOXB13, KIT, MITF, PDGFRA, POLD1, and POLE (sequencing only); EPCAM and GREM1 (deletion/duplication only).    04/09/2021 Cancer Staging   Staging form: Breast, AJCC 8th Edition - Pathologic stage from 04/09/2021: Stage Unknown (pTis (DCIS), pNX, cM0, G2, ER+, PR+, HER2: Not Assessed) - Signed by Truitt Merle, MD on 06/12/2021 Stage prefix: Initial diagnosis Histologic grading system: 3 grade system Residual tumor (R): R0 - None   04/09/2021 Definitive Surgery   FINAL MICROSCOPIC DIAGNOSIS:   A. BREAST, RIGHT, LUMPECTOMY:  -  Ductal carcinoma in situ (DCIS), nuclear grade II (intermediate),  micropapillary pattern, 2 mm in greatest dimension.  -  Resection edges negative for DCIS.  -  Distance from DCIS to closest resected edge: 1 mm (inferior edge), see Cancer Protocol below for final margin status.  -  Prior biopsy site changes/scar.  -  Fibrocystic changes with cystic apocrine metaplasia/hyperplasia.   B. BREAST,  RIGHT ADDITIONAL LATERAL MARGIN, EXCISION:  -  Benign breast tissue.    C. BREAST, RIGHT ADDITIONAL INFERIOR MARGIN, EXCISION:  -  Benign breast tissue.   D. BREAST, RIGHT ADDITIONAL POSTERIOR MARGIN, EXCISION:  -  Benign breast tissue.      INTERVAL HISTORY:  Ms. Lumsden presents to the [facility] today for our initial meeting to review her survivorship care plan detailing her treatment course for breast cancer, as well as monitoring long-term side effects of that treatment, education regarding health maintenance, screening, and overall wellness and health promotion.     Overall, Ms. Bunte reports feeling quite well since completing her radiation therapy approximately 3 months ago.  She ***    REVIEW OF SYSTEMS:  Review of Systems - Oncology Breast: Denies any new nodularity, masses, tenderness, nipple changes, or nipple discharge.      ONCOLOGY TREATMENT TEAM:  1. Surgeon:  Dr. Donne [facility]el at [facility] 2. Medical Oncologist: Dr. Burr Medico  3. Radiation Oncologist: Dr. Sondra Come    PAST MEDICAL/SURGICAL HISTORY:  Past Medical History:  Diagnosis Date   Anxiety    not on meds-hx of   Breast cancer ([place])    pre cancerous cells-radiation   Family history of adverse reaction to anesthesia    mother hard time waking up after surgery   High blood pressure    on meds   History of radiation therapy    right breast 05/21/2021-06/15/2021  Dr Gery Pray   Hyperlipidemia    on meds   Thyroid disease    on meds   Past Surgical History:  Procedure Laterality Date   BREAST LUMPECTOMY WITH RADIOACTIVE SEED LOCALIZATION Right 04/09/2021   Procedure: RIGHT BREAST LUMPECTOMY WITH RADIOACTIVE SEED LOCALIZATION;  Surgeon: Rolm Bookbinder, MD;  Location: [place];  Service: General;  Laterality: Right;   EYE SURGERY Right 2009   Tightened muscle of eye     ALLERGIES:  No Known Allergies   CURRENT MEDICATIONS:  Outpatient Encounter Medications as of 10/15/2021  Medication Sig   amLODipine (NORVASC) 10 MG  tablet TAKE 1 TABLET BY MOUTH  DAILY   atenolol (TENORMIN) 50 MG tablet TAKE 1 TABLET(50 MG) BY MOUTH DAILY   ezetimibe (ZETIA) 10 MG tablet TAKE 1 TABLET BY MOUTH  DAILY   irbesartan (AVAPRO) 150 MG tablet TAKE 1 TABLET BY MOUTH  DAILY   levothyroxine (SYNTHROID) 50 MCG tablet Take 1 tablet (50 mcg total) by mouth daily.   naproxen sodium (ALEVE) 220 MG tablet Take 220 mg by mouth 2 (two) times daily as needed (As needed but not daily). Pt takes 2 pills for a total of 49m   No facility-administered encounter medications on file as of 10/15/2021.     ONCOLOGIC FAMILY HISTORY:  Family History  Problem Relation Age of Onset   Hypertension Mother    Stroke Mother    Thyroid disease Sister    Throat cancer Maternal Aunt 61   Lung cancer Maternal Uncle        dx after 574 x2 maternal uncles   Bone cancer Maternal Grandmother        dx after 584  Colon polyps Neg Hx    Colon cancer Neg Hx    Esophageal cancer Neg Hx    Stomach cancer Neg Hx    Rectal cancer Neg Hx      GENETIC COUNSELING/TESTING: N/A  SOCIAL HISTORY:  SArabella MerlesJBrambleis /single/married/divorced/widowed/separated and lives alone[place]  She has (#) children and they live in (city).  Ms. Schickling is currently retired/disabled/working part-time/full-time as ***.  She denies any current or history of tobacco, alcohol, or illicit drug use.     PHYSICAL EXAMINATION:  Vital Signs:  There were no vitals filed for this visit. There were no vitals filed for this visit. General: Well-nourished, well-appearing female in no acute distress.  She is unaccompanied/accompanied in clinic by her ***** today.   HEENT: Head is normocephalic.  Pupils equal and reactive to light. Conjunctivae clear without exudate.  Sclerae anicteric. Oral mucosa is pink, moist.  Oropharynx is pink without lesions or erythema.  Lymph: No cervical, supraclavicular, or infraclavicular lymphadenopathy  noted on palpation.  Cardiovascular: Regular rate and rhythm.Marland Kitchen Respiratory: Clear to auscultation bilaterally. Chest expansion symmetric; breathing non-labored.  GI: Abdomen soft and round; non-tender, non-distended. Bowel sounds normoactive.  GU: Deferred.  Neuro: No focal deficits. Steady gait.  Psych: Mood and affect normal and appropriate for situation.  Extremities: No edema. MSK: No focal spinal tenderness to palpation.  Full range of motion in bilateral upper extremities Skin: Warm and dry.  LABORATORY DATA:  None for this visit.  DIAGNOSTIC IMAGING:  None for this visit.      ASSESSMENT AND PLAN:  Ms.. Thornton is a pleasant 46 y.o. female with Stage 0 right breast DCIS, ER+/PR+, diagnosed in 02/2021, treated with lumpectomy, adjuvant radiation therapy; anti-estrogen therapy with Tamoxifen has been recommended.  She presents to the Survivorship Clinic for our initial meeting and routine follow-up post-completion of treatment for breast cancer.    1. Stage 0 right breast DCIS:  Ms. Hukill is continuing to recover from definitive treatment for breast cancer. She will follow-up with her medical oncologist, Dr. Burr Medico in *** with history and physical exam per surveillance protocol.  She will continue her anti-estrogen therapy with (drug). Thus far, she is tolerating the *** well, with minimal side effects. She was instructed to make Dr. Burr Medico or myself aware if she begins to experience any worsening side effects of the medication and I could see her back in clinic to help manage those side effects, as needed. Though the incidence is low, there is an associated risk of endometrial cancer with anti-estrogen therapies like Tamoxifen.  Ms. Fleissner was encouraged to contact Dr. Burr Medico or myself with any vaginal bleeding while taking Tamoxifen. Other side effects of Tamoxifen were again reviewed with her as well. Today, a comprehensive survivorship care plan and treatment summary was reviewed with  the patient today detailing her breast cancer diagnosis, treatment course, potential late/long-term effects of treatment, appropriate follow-up care with recommendations for the future, and patient education resources.  A copy of this summary, along with a letter will be sent to the patient's primary care provider via mail/fax/In Basket message after today's visit.    #. Problem(s) at Visit______________  #. Bone health:  Given Ms. Bilton age/history of breast cancer and her current treatment regimen including anti-estrogen therapy with _______, she is at risk for bone demineralization.  Her last DEXA scan was **/**/20**, which showed (results).***  In the meantime, she was encouraged to increase her consumption of foods rich in calcium, as well as increase her weight-bearing activities.  She was given education on specific activities to promote bone health.  #. Cancer screening:  Due to Ms. Tomasetti history and her age, she should receive screening for skin cancers, colon cancer, and gynecologic cancers.  The information and recommendations are listed on the patient's comprehensive care  plan/treatment summary and were reviewed in detail with the patient.    #. Health maintenance and wellness promotion: Ms. Bellucci was encouraged to consume 5-7 servings of fruits and vegetables per day. We reviewed the "Nutrition Rainbow" handout, as well as the handout "Take Control of Your Health and Reduce Your Cancer Risk" from the [place].  She was also encouraged to engage in moderate to vigorous exercise for 30 minutes per day most days of the week. We discussed the [employer] fitness program, which is designed for cancer survivors to help them become more physically fit after cancer treatments.  She was instructed to limit her alcohol consumption and continue to abstain from tobacco use/***was encouraged stop smoking.     #. Support services/counseling: It is not uncommon for this period of  the patient's cancer care trajectory to be one of many emotions and stressors.  We discussed an opportunity for her to participate in the next session of [facility] ("Finding Your New Normal") support group series designed for patients after they have completed treatment.   Ms. Watt was encouraged to take advantage of our many other support services programs, support groups, and/or counseling in coping with her new life as a cancer survivor after completing anti-cancer treatment.  She was offered support today through active listening and expressive supportive counseling.  She was given information regarding our available services and encouraged to contact me with any questions or for help enrolling in any of our support group/programs.    Dispo:   -Return to cancer center ***  -Mammogram due in *** -Follow up with surgery *** -She is welcome to return back to the Survivorship Clinic at any time; no additional follow-up needed at this time.  -Consider referral back to survivorship as a long-term survivor for continued surveillance  A total of (30) minutes of face-to-face time was spent with this patient with greater than 50% of that time in counseling and care-coordination.   Cira Rue, NP Survivorship Program [facility] [phone]   Note: PRIMARY CARE PROVIDER Agustina Caroli [place], [place] [phone]

## 2021-10-15 ENCOUNTER — Inpatient Hospital Stay: Payer: 59 | Attending: Nurse Practitioner | Admitting: Nurse Practitioner

## 2021-10-19 ENCOUNTER — Telehealth: Payer: Self-pay | Admitting: Hematology

## 2021-10-19 NOTE — Telephone Encounter (Signed)
.  Called pt per 6/29 inbasket , Patient was unavailable, a message with appt time and date was left with number on file.

## 2021-11-23 ENCOUNTER — Encounter: Payer: Self-pay | Admitting: *Deleted

## 2021-11-26 ENCOUNTER — Inpatient Hospital Stay: Payer: 59 | Attending: Nurse Practitioner | Admitting: Nurse Practitioner

## 2021-12-11 ENCOUNTER — Encounter: Payer: Self-pay | Admitting: *Deleted

## 2021-12-11 ENCOUNTER — Telehealth: Payer: Self-pay | Admitting: Adult Health

## 2021-12-11 NOTE — Telephone Encounter (Signed)
Scheduled per 8/22 in basket, message has been left  

## 2022-01-02 ENCOUNTER — Inpatient Hospital Stay: Payer: 59 | Attending: Nurse Practitioner | Admitting: Physician Assistant

## 2022-01-17 ENCOUNTER — Encounter: Payer: 59 | Admitting: Adult Health

## 2022-01-20 ENCOUNTER — Other Ambulatory Visit: Payer: Self-pay | Admitting: Emergency Medicine

## 2022-01-20 DIAGNOSIS — I10 Essential (primary) hypertension: Secondary | ICD-10-CM

## 2022-01-21 NOTE — Progress Notes (Signed)
46 y.o. G0P0000 Widowed Black or [place] American Not Hispanic or Latino female here for annual exam.  Sexually active, same partner, no dyspareunia. Living together.  Period Cycle (Days): 28 Period Duration (Days): 7 Period Pattern: Regular Menstrual Flow: Moderate Menstrual Control: Tampon, Panty liner Menstrual Control Change Freq (Hours): 3-4 Dysmenorrhea: (!) Severe Dysmenorrhea Symptoms: Cramping, Headache Cramps have gotten worse in the last year. Bad for one day.   H/O DCIS, diagnosed in 11/22, s/p right lumpectomy and radiation. She is f/u with Oncology next week and will discuss Tamoxifen.   Patient's last menstrual period was 01/09/2022.          Sexually active: Yes.    The current method of family planning is none.    Exercising: No.  The patient does not participate in regular exercise at present. Smoker:  no  Health Maintenance: Pap:01/23/21 WNL Hr Hpv Neg,  06/19/16 Neg:Neg HR HPV History of abnormal Pap:  yes years ago. Follow up was  MMG:  see epic for bx results, has f/u next week.  BMD:   none  Colonoscopy: 08/07/21, + polyp, f/u 7 years.   TDaP:  Unsure  Gardasil: none    reports that she has never smoked. She has never used smokeless tobacco. She reports that she does not currently use alcohol after a past usage of about 7.0 standard drinks of alcohol per week. She reports current drug use. Frequency: 3.00 times per week. Drug: Marijuana. She works in data entry full time, part time at [employer]. Close with her nieces, nephew and step children. From [place], all of her family is here.  Past Medical History:  Diagnosis Date   Anxiety    not on meds-hx of   Breast cancer ([place])    pre cancerous cells-radiation   Family history of adverse reaction to anesthesia    mother hard time waking up after surgery   High blood pressure    on meds   History of radiation therapy    right breast 05/21/2021-06/15/2021  Dr Gery Pray   Hyperlipidemia    on meds   Thyroid  disease    on meds    Past Surgical History:  Procedure Laterality Date   BREAST LUMPECTOMY WITH RADIOACTIVE SEED LOCALIZATION Right 04/09/2021   Procedure: RIGHT BREAST LUMPECTOMY WITH RADIOACTIVE SEED LOCALIZATION;  Surgeon: Rolm Bookbinder, MD;  Location: [facility];  Service: General;  Laterality: Right;   EYE SURGERY Right 2009   Tightened muscle of eye    Current Outpatient Medications  Medication Sig Dispense Refill   amLODipine (NORVASC) 10 MG tablet TAKE 1 TABLET BY MOUTH  DAILY 90 tablet 3   atenolol (TENORMIN) 50 MG tablet TAKE 1 TABLET BY MOUTH EVERY DAY 90 tablet 1   ezetimibe (ZETIA) 10 MG tablet TAKE 1 TABLET BY MOUTH  DAILY 90 tablet 3   irbesartan (AVAPRO) 150 MG tablet TAKE 1 TABLET BY MOUTH  DAILY 90 tablet 3   levothyroxine (SYNTHROID) 50 MCG tablet Take 1 tablet (50 mcg total) by mouth daily. 90 tablet 3   naproxen sodium (ALEVE) 220 MG tablet Take 220 mg by mouth 2 (two) times daily as needed (As needed but not daily). Pt takes 2 pills for a total of '440mg'$      No current facility-administered medications for this visit.    Family History  Problem Relation Age of Onset   Hypertension Mother    Stroke Mother    Thyroid disease Sister    Throat cancer  Maternal Aunt 61   Lung cancer Maternal Uncle        dx after 21; x2 maternal uncles   Bone cancer Maternal Grandmother        dx after 71   Colon polyps Neg Hx    Colon cancer Neg Hx    Esophageal cancer Neg Hx    Stomach cancer Neg Hx    Rectal cancer Neg Hx     Review of Systems  All other systems reviewed and are negative.   Exam:   BP 120/62   Pulse 73   Ht 5' 2.5" (1.588 m)   Wt 197 lb (89.4 kg)   LMP 01/09/2022   SpO2 98%   BMI 35.46 kg/m   Weight change: '@WEIGHTCHANGE'$ @ Height:   Height: 5' 2.5" (158.8 cm)  Ht Readings from Last 3 Encounters:  01/24/22 5' 2.5" (1.588 m)  09/04/21 '5\' 4"'$  (1.626 m)  08/07/21 '5\' 4"'$  (1.626 m)    General appearance: alert, cooperative and  appears stated age Head: Normocephalic, without obvious abnormality, atraumatic Neck: no adenopathy, supple, symmetrical, trachea midline and thyroid normal to inspection and palpation Lungs: clear to auscultation bilaterally Cardiovascular: regular rate and rhythm Breasts:  in the right breast there is some nodularity in the upper outer quadrant a few cm from the lumpectomy site. The entire right breast has some increased pigmentation compared to the left and is tender (she states no change) Abdomen: soft, non-tender; non distended,  no masses,  no organomegaly Extremities: extremities normal, atraumatic, no cyanosis or edema Skin: Skin color, texture, turgor normal. No rashes or lesions Lymph nodes: Cervical, supraclavicular, and axillary nodes normal. No abnormal inguinal nodes palpated Neurologic: Grossly normal   Pelvic: External genitalia:  no lesions              Urethra:  normal appearing urethra with no masses, tenderness or lesions              Bartholins and Skenes: normal                 Vagina: normal appearing vagina with normal color and discharge, no lesions              Cervix: no lesions               Bimanual Exam:  Uterus:   no masses or tenderness              Adnexa: no mass, fullness, tenderness               Rectovaginal: Confirms               Anus:  normal sphincter tone, no lesions  Gae Dry, CMA chaperoned for the exam.  1. Well woman exam Discussed breast self exam Discussed calcium and vit D intake Mammogram scheduled Colonoscopy UTD  2. Dysmenorrhea - naproxen sodium (ANAPROX DS) 550 MG tablet; Take one tablet po q 12 hours prn cramps.  Dispense: 30 tablet; Refill: 2  3. Ductal carcinoma in situ (DCIS) of right breast S/P lumpectomy and radiation. Some nodularity in the upper right breast, suspect changes from treatment. She is f/u with Oncology next week and for a mammogram next week.

## 2022-01-24 ENCOUNTER — Ambulatory Visit (INDEPENDENT_AMBULATORY_CARE_PROVIDER_SITE_OTHER): Payer: 59 | Admitting: Obstetrics and Gynecology

## 2022-01-24 ENCOUNTER — Encounter: Payer: Self-pay | Admitting: Obstetrics and Gynecology

## 2022-01-24 VITALS — BP 120/62 | HR 73 | Ht 62.5 in | Wt 197.0 lb

## 2022-01-24 DIAGNOSIS — N946 Dysmenorrhea, unspecified: Secondary | ICD-10-CM | POA: Diagnosis not present

## 2022-01-24 DIAGNOSIS — Z01419 Encounter for gynecological examination (general) (routine) without abnormal findings: Secondary | ICD-10-CM

## 2022-01-24 DIAGNOSIS — D0511 Intraductal carcinoma in situ of right breast: Secondary | ICD-10-CM | POA: Diagnosis not present

## 2022-01-24 MED ORDER — NAPROXEN SODIUM 550 MG PO TABS: ORAL_TABLET | ORAL | 2 refills | Status: DC

## 2022-01-24 NOTE — Patient Instructions (Signed)

## 2022-01-31 LAB — HM MAMMOGRAPHY

## 2022-05-30 ENCOUNTER — Other Ambulatory Visit: Payer: Self-pay | Admitting: Emergency Medicine

## 2022-05-30 DIAGNOSIS — E785 Hyperlipidemia, unspecified: Secondary | ICD-10-CM

## 2022-06-06 ENCOUNTER — Encounter: Payer: Self-pay | Admitting: Emergency Medicine

## 2022-06-06 ENCOUNTER — Ambulatory Visit (INDEPENDENT_AMBULATORY_CARE_PROVIDER_SITE_OTHER): Payer: 59 | Admitting: Emergency Medicine

## 2022-06-06 VITALS — BP 110/68 | HR 90 | Temp 98.0°F | Ht 62.5 in | Wt 201.4 lb

## 2022-06-06 DIAGNOSIS — E039 Hypothyroidism, unspecified: Secondary | ICD-10-CM

## 2022-06-06 DIAGNOSIS — R7303 Prediabetes: Secondary | ICD-10-CM | POA: Diagnosis not present

## 2022-06-06 DIAGNOSIS — I10 Essential (primary) hypertension: Secondary | ICD-10-CM

## 2022-06-06 DIAGNOSIS — E785 Hyperlipidemia, unspecified: Secondary | ICD-10-CM

## 2022-06-06 LAB — CBC WITH DIFFERENTIAL/PLATELET
Basophils Absolute: 0.1 10*3/uL (ref 0.0–0.1)
Basophils Relative: 0.9 % (ref 0.0–3.0)
Eosinophils Absolute: 0.1 10*3/uL (ref 0.0–0.7)
Eosinophils Relative: 1.7 % (ref 0.0–5.0)
HCT: 34.7 % — ABNORMAL LOW (ref 36.0–46.0)
Hemoglobin: 11.8 g/dL — ABNORMAL LOW (ref 12.0–15.0)
Lymphocytes Relative: 25.9 % (ref 12.0–46.0)
Lymphs Abs: 1.5 10*3/uL (ref 0.7–4.0)
MCHC: 34.1 g/dL (ref 30.0–36.0)
MCV: 94.1 fl (ref 78.0–100.0)
Monocytes Absolute: 0.5 10*3/uL (ref 0.1–1.0)
Monocytes Relative: 8.7 % (ref 3.0–12.0)
Neutro Abs: 3.6 10*3/uL (ref 1.4–7.7)
Neutrophils Relative %: 62.8 % (ref 43.0–77.0)
Platelets: 348 10*3/uL (ref 150.0–400.0)
RBC: 3.69 Mil/uL — ABNORMAL LOW (ref 3.87–5.11)
RDW: 12.5 % (ref 11.5–15.5)
WBC: 5.7 10*3/uL (ref 4.0–10.5)

## 2022-06-06 LAB — HEMOGLOBIN A1C: Hgb A1c MFr Bld: 5.9 % (ref 4.6–6.5)

## 2022-06-06 LAB — TSH: TSH: 2.15 u[IU]/mL (ref 0.35–5.50)

## 2022-06-06 MED ORDER — AMLODIPINE BESYLATE 10 MG PO TABS: 10.0000 mg | ORAL_TABLET | Freq: Every day | ORAL | 3 refills | Status: DC

## 2022-06-06 MED ORDER — EZETIMIBE 10 MG PO TABS: 10.0000 mg | ORAL_TABLET | Freq: Every day | ORAL | 3 refills | Status: DC

## 2022-06-06 MED ORDER — IRBESARTAN 150 MG PO TABS: 150.0000 mg | ORAL_TABLET | Freq: Every day | ORAL | 3 refills | Status: DC

## 2022-06-06 NOTE — Progress Notes (Signed)
Ann Thornton 47 y.o.   No chief complaint on file.   HISTORY OF PRESENT ILLNESS: This is a 47 y.o. female here for follow-up of hypertension and dyslipidemia and medication refills. Overall doing well. Has no complaints or medical concerns today.  HPI   Prior to Admission medications   Medication Sig Start Date End Date Taking? Authorizing Provider  amLODipine (NORVASC) 10 MG tablet TAKE 1 TABLET BY MOUTH  DAILY 06/28/21  Yes Chayanne Speir, Ines Bloomer, MD  atenolol (TENORMIN) 50 MG tablet TAKE 1 TABLET BY MOUTH EVERY DAY 01/20/22  Yes Grecia Lynk, Ines Bloomer, MD  ezetimibe (ZETIA) 10 MG tablet TAKE 1 TABLET BY MOUTH  DAILY 06/28/21  Yes Quantae Martel, Ines Bloomer, MD  irbesartan (AVAPRO) 150 MG tablet TAKE 1 TABLET BY MOUTH  DAILY 06/28/21  Yes Aalina Brege, Ines Bloomer, MD  levothyroxine (SYNTHROID) 50 MCG tablet Take 1 tablet (50 mcg total) by mouth daily. 09/04/21  Yes Dynasti Kerman, Ines Bloomer, MD  naproxen sodium (ANAPROX DS) 550 MG tablet Take one tablet po q 12 hours prn cramps. 01/24/22  Yes Salvadore Dom, MD    No Known Allergies  Patient Active Problem List   Diagnosis Date Noted   Genetic testing 03/20/2021   Ductal carcinoma in situ (DCIS) of right breast 03/02/2021   COVID-19 10/27/2020   Hypothyroidism 08/07/2020   Dyslipidemia 12/08/2019   Prediabetes 12/08/2019   Abnormal thyroid blood test 12/08/2019   Mixed hyperlipidemia 07/07/2018   Essential hypertension 03/23/2015   Anxiety disorder 06/02/2014    Past Medical History:  Diagnosis Date   Anxiety    not on meds-hx of   Breast cancer ([place])    pre cancerous cells-radiation   Family history of adverse reaction to anesthesia    mother hard time waking up after surgery   High blood pressure    on meds   History of radiation therapy    right breast 05/21/2021-06/15/2021  Dr Gery Pray   Hyperlipidemia    on meds   Thyroid disease    on meds    Past Surgical History:  Procedure Laterality Date   BREAST  LUMPECTOMY WITH RADIOACTIVE SEED LOCALIZATION Right 04/09/2021   Procedure: RIGHT BREAST LUMPECTOMY WITH RADIOACTIVE SEED LOCALIZATION;  Surgeon: Rolm Bookbinder, MD;  Location: [facility];  Service: General;  Laterality: Right;   EYE SURGERY Right 2009   Tightened muscle of eye    Social History   Socioeconomic History   Marital status: Widowed    Spouse name: Not on file   Number of children: 0   Years of education: Not on file   Highest education level: Not on file  Occupational History   Occupation: customer service    Employer: [employer]  Tobacco Use   Smoking status: Never   Smokeless tobacco: Never  Vaping Use   Vaping Use: Never used  Substance and Sexual Activity   Alcohol use: Not Currently    Alcohol/week: 7.0 standard drinks of alcohol    Types: 7 Standard drinks or equivalent per week    Comment: 1 beer daily   Drug use: Yes    Frequency: 3.0 times per week    Types: Marijuana   Sexual activity: Yes    Partners: Male    Birth control/protection: None  Other Topics Concern   Not on file  Social History Narrative   Single without children   Working from home   Exercise - zumba once a week, fitness workout on Mondays  Diet - trying to be good, eating no salt.          As of 11/08/15:      Diet: N/A   Caffeine: No Sodas   Married: Engaged   House: Lives in apartment, 2 stories, 1 person    Pets: None   Current/Past profession: Data Entry    Exercise: No    Living Will: No    DNR: No    POA/HPOA: No       Social Determinants of Radio broadcast assistant Strain: Low Risk  (03/07/2021)   Overall Financial Resource Strain (CARDIA)    Difficulty of Paying Living Expenses: Not very hard  Food Insecurity: No Food Insecurity (03/07/2021)   Hunger Vital Sign    Worried About Running Out of Food in the Last Year: Never true    Ran Out of Food in the Last Year: Never true  Transportation Needs: No Transportation Needs  (03/07/2021)   PRAPARE - Hydrologist (Medical): No    Lack of Transportation (Non-Medical): No  Physical Activity: Not on file  Stress: Not on file  Social Connections: Not on file  Intimate Partner Violence: Not on file    Family History  Problem Relation Age of Onset   Hypertension Mother    Stroke Mother    Thyroid disease Sister    Throat cancer Maternal Aunt 61   Lung cancer Maternal Uncle        dx after 72; x2 maternal uncles   Bone cancer Maternal Grandmother        dx after 58   Colon polyps Neg Hx    Colon cancer Neg Hx    Esophageal cancer Neg Hx    Stomach cancer Neg Hx    Rectal cancer Neg Hx      Review of Systems  Constitutional: Negative.  Negative for chills and fever.  HENT: Negative.  Negative for congestion and sore throat.   Respiratory: Negative.  Negative for cough and shortness of breath.   Cardiovascular: Negative.  Negative for chest pain and palpitations.  Gastrointestinal: Negative.  Negative for abdominal pain, diarrhea, nausea and vomiting.  Genitourinary: Negative.  Negative for dysuria and hematuria.  Skin: Negative.  Negative for rash.  Neurological: Negative.  Negative for dizziness and headaches.  All other systems reviewed and are negative.  Today's Vitals   06/06/22 1524  BP: 110/68  Pulse: 90  Temp: 98 F (36.7 C)  TempSrc: Temporal  SpO2: 98%  Weight: 201 lb 6 oz (91.3 kg)  Height: 5' 2.5" (1.588 m)   Body mass index is 36.25 kg/m.   Physical Exam Vitals reviewed.  Constitutional:      Appearance: Normal appearance.  HENT:     Head: Normocephalic.     Mouth/Throat:     Mouth: Mucous membranes are moist.     Pharynx: Oropharynx is clear.  Eyes:     Extraocular Movements: Extraocular movements intact.     Pupils: Pupils are equal, round, and reactive to light.  Cardiovascular:     Rate and Rhythm: Normal rate and regular rhythm.     Pulses: Normal pulses.     Heart sounds: Normal  heart sounds.  Pulmonary:     Effort: Pulmonary effort is normal.     Breath sounds: Normal breath sounds.  Musculoskeletal:     Cervical back: No tenderness.  Lymphadenopathy:     Cervical: No cervical adenopathy.  Skin:  General: Skin is warm and dry.  Neurological:     General: No focal deficit present.     Mental Status: She is alert and oriented to person, place, and time.  Psychiatric:        Mood and Affect: Mood normal.        Behavior: Behavior normal.      ASSESSMENT & PLAN: A total of 45 minutes was spent with the patient and counseling/coordination of care regarding preparing for this visit, review of most recent office visit notes, review of multiple chronic medical conditions under management, review of all medications, cardiovascular risks associated with hypertension and dyslipidemia, education on nutrition, review of health maintenance items, review of most recent blood work results, prognosis, documentation, and need for follow-up.  Problem List Items Addressed This Visit       Cardiovascular and Mediastinum   Essential hypertension - Primary    Well-controlled hypertension. Continue irbesartan 150 mg daily and atenolol 50 mg daily and amlodipine 10 mg daily. Cardiovascular risk associated with hypertension discussed. Dietary approaches to stop hypertension discussed. BP Readings from Last 3 Encounters:  06/06/22 110/68  01/24/22 120/62  09/04/21 122/84        Relevant Medications   amLODipine (NORVASC) 10 MG tablet   ezetimibe (ZETIA) 10 MG tablet   irbesartan (AVAPRO) 150 MG tablet   Other Relevant Orders   Comprehensive metabolic panel   CBC with Differential/Platelet   Hemoglobin A1c     Endocrine   Hypothyroidism    Clinically euthyroid.  TSH done today. Continue Synthroid 50 mcg daily.      Relevant Orders   TSH     Other   Dyslipidemia    Stable and chronic.  Diet and nutrition discussed. Continue Zetia 10 mg daily.  Intolerant to  statins. The 10-year ASCVD risk score (Arnett DK, et al., 2019) is: 1%   Values used to calculate the score:     Age: 13 years     Sex: Female     Is Non-Hispanic African American: Yes     Diabetic: No     Tobacco smoker: No     Systolic Blood Pressure: A999333 mmHg     Is BP treated: Yes     HDL Cholesterol: 65.4 mg/dL     Total Cholesterol: 216 mg/dL       Relevant Medications   ezetimibe (ZETIA) 10 MG tablet   Other Relevant Orders   Hemoglobin A1c   Lipid panel   Prediabetes    Diet and nutrition discussed.  Advised to decrease amount of daily carbohydrate intake and daily calories and increase amount of plant-based protein in her diet. Hemoglobin A1c done today.      Patient Instructions  Hypertension, Adult High blood pressure (hypertension) is when the force of blood pumping through the arteries is too strong. The arteries are the blood vessels that carry blood from the heart throughout the body. Hypertension forces the heart to work harder to pump blood and may cause arteries to become narrow or stiff. Untreated or uncontrolled hypertension can lead to a heart attack, heart failure, a stroke, kidney disease, and other problems. A blood pressure reading consists of a higher number over a lower number. Ideally, your blood pressure should be below 120/80. The first ("top") number is called the systolic pressure. It is a measure of the pressure in your arteries as your heart beats. The second ("bottom") number is called the diastolic pressure. It is a measure of the  pressure in your arteries as the heart relaxes. What are the causes? The exact cause of this condition is not known. There are some conditions that result in high blood pressure. What increases the risk? Certain factors may make you more likely to develop high blood pressure. Some of these risk factors are under your control, including: Smoking. Not getting enough exercise or physical activity. Being  overweight. Having too much fat, sugar, calories, or salt (sodium) in your diet. Drinking too much alcohol. Other risk factors include: Having a personal history of heart disease, diabetes, high cholesterol, or kidney disease. Stress. Having a family history of high blood pressure and high cholesterol. Having obstructive sleep apnea. Age. The risk increases with age. What are the signs or symptoms? High blood pressure may not cause symptoms. Very high blood pressure (hypertensive crisis) may cause: Headache. Fast or irregular heartbeats (palpitations). Shortness of breath. Nosebleed. Nausea and vomiting. Vision changes. Severe chest pain, dizziness, and seizures. How is this diagnosed? This condition is diagnosed by measuring your blood pressure while you are seated, with your arm resting on a flat surface, your legs uncrossed, and your feet flat on the floor. The cuff of the blood pressure monitor will be placed directly against the skin of your upper arm at the level of your heart. Blood pressure should be measured at least twice using the same arm. Certain conditions can cause a difference in blood pressure between your right and left arms. If you have a high blood pressure reading during one visit or you have normal blood pressure with other risk factors, you may be asked to: Return on a different day to have your blood pressure checked again. Monitor your blood pressure at home for 1 week or longer. If you are diagnosed with hypertension, you may have other blood or imaging tests to help your health care provider understand your overall risk for other conditions. How is this treated? This condition is treated by making healthy lifestyle changes, such as eating healthy foods, exercising more, and reducing your alcohol intake. You may be referred for counseling on a healthy diet and physical activity. Your health care provider may prescribe medicine if lifestyle changes are not enough to  get your blood pressure under control and if: Your systolic blood pressure is above 130. Your diastolic blood pressure is above 80. Your personal target blood pressure may vary depending on your medical conditions, your age, and other factors. Follow these instructions at home: Eating and drinking  Eat a diet that is high in fiber and potassium, and low in sodium, added sugar, and fat. An example of this eating plan is called the DASH diet. DASH stands for Dietary Approaches to Stop Hypertension. To eat this way: Eat plenty of fresh fruits and vegetables. Try to fill one half of your plate at each meal with fruits and vegetables. Eat whole grains, such as whole-wheat pasta, brown rice, or whole-grain bread. Fill about one fourth of your plate with whole grains. Eat or drink low-fat dairy products, such as skim milk or low-fat yogurt. Avoid fatty cuts of meat, processed or cured meats, and poultry with skin. Fill about one fourth of your plate with lean proteins, such as fish, chicken without skin, beans, eggs, or tofu. Avoid pre-made and processed foods. These tend to be higher in sodium, added sugar, and fat. Reduce your daily sodium intake. Many people with hypertension should eat less than 1,500 mg of sodium a day. Do not drink alcohol if: Your  health care provider tells you not to drink. You are pregnant, may be pregnant, or are planning to become pregnant. If you drink alcohol: Limit how much you have to: 0-1 drink a day for women. 0-2 drinks a day for men. Know how much alcohol is in your drink. In the [place] one drink equals one 12 oz bottle of beer (355 mL), one 5 oz glass of wine (148 mL), or one 1 oz glass of hard liquor (44 mL). Lifestyle  Work with your health care provider to maintain a healthy body weight or to lose weight. Ask what an ideal weight is for you. Get at least 30 minutes of exercise that causes your heart to beat faster (aerobic exercise) most days of the week.  Activities may include walking, swimming, or biking. Include exercise to strengthen your muscles (resistance exercise), such as Pilates or lifting weights, as part of your weekly exercise routine. Try to do these types of exercises for 30 minutes at least 3 days a week. Do not use any products that contain nicotine or tobacco. These products include cigarettes, chewing tobacco, and vaping devices, such as e-cigarettes. If you need help quitting, ask your health care provider. Monitor your blood pressure at home as told by your health care provider. Keep all follow-up visits. This is important. Medicines Take over-the-counter and prescription medicines only as told by your health care provider. Follow directions carefully. Blood pressure medicines must be taken as prescribed. Do not skip doses of blood pressure medicine. Doing this puts you at risk for problems and can make the medicine less effective. Ask your health care provider about side effects or reactions to medicines that you should watch for. Contact a health care provider if you: Think you are having a reaction to a medicine you are taking. Have headaches that keep coming back (recurring). Feel dizzy. Have swelling in your ankles. Have trouble with your vision. Get help right away if you: Develop a severe headache or confusion. Have unusual weakness or numbness. Feel faint. Have severe pain in your chest or abdomen. Vomit repeatedly. Have trouble breathing. These symptoms may be an emergency. Get help right away. Call 911. Do not wait to see if the symptoms will go away. Do not drive yourself to the hospital. Summary Hypertension is when the force of blood pumping through your arteries is too strong. If this condition is not controlled, it may put you at risk for serious complications. Your personal target blood pressure may vary depending on your medical conditions, your age, and other factors. For most people, a normal blood  pressure is less than 120/80. Hypertension is treated with lifestyle changes, medicines, or a combination of both. Lifestyle changes include losing weight, eating a healthy, low-sodium diet, exercising more, and limiting alcohol. This information is not intended to replace advice given to you by your health care provider. Make sure you discuss any questions you have with your health care provider. Document Revised: 02/13/2021 Document Reviewed: 02/13/2021 Elsevier Patient Education  [place], MD [facility]

## 2022-06-06 NOTE — Assessment & Plan Note (Signed)
Diet and nutrition discussed.  Advised to decrease amount of daily carbohydrate intake and daily calories and increase amount of plant-based protein in her diet. Hemoglobin A1c done today.

## 2022-06-06 NOTE — Assessment & Plan Note (Signed)
Stable and chronic.  Diet and nutrition discussed. Continue Zetia 10 mg daily.  Intolerant to statins. The 10-year ASCVD risk score (Arnett DK, et al., 2019) is: 1%   Values used to calculate the score:     Age: 47 years     Sex: Female     Is Non-Hispanic African American: Yes     Diabetic: No     Tobacco smoker: No     Systolic Blood Pressure: A999333 mmHg     Is BP treated: Yes     HDL Cholesterol: 65.4 mg/dL     Total Cholesterol: 216 mg/dL

## 2022-06-06 NOTE — Assessment & Plan Note (Signed)
Well-controlled hypertension. Continue irbesartan 150 mg daily and atenolol 50 mg daily and amlodipine 10 mg daily. Cardiovascular risk associated with hypertension discussed. Dietary approaches to stop hypertension discussed. BP Readings from Last 3 Encounters:  06/06/22 110/68  01/24/22 120/62  09/04/21 122/84

## 2022-06-06 NOTE — Patient Instructions (Signed)
Hypertension, Adult High blood pressure (hypertension) is when the force of blood pumping through the arteries is too strong. The arteries are the blood vessels that carry blood from the heart throughout the body. Hypertension forces the heart to work harder to pump blood and may cause arteries to become narrow or stiff. Untreated or uncontrolled hypertension can lead to a heart attack, heart failure, a stroke, kidney disease, and other problems. A blood pressure reading consists of a higher number over a lower number. Ideally, your blood pressure should be below 120/80. The first ("top") number is called the systolic pressure. It is a measure of the pressure in your arteries as your heart beats. The second ("bottom") number is called the diastolic pressure. It is a measure of the pressure in your arteries as the heart relaxes. What are the causes? The exact cause of this condition is not known. There are some conditions that result in high blood pressure. What increases the risk? Certain factors may make you more likely to develop high blood pressure. Some of these risk factors are under your control, including: Smoking. Not getting enough exercise or physical activity. Being overweight. Having too much fat, sugar, calories, or salt (sodium) in your diet. Drinking too much alcohol. Other risk factors include: Having a personal history of heart disease, diabetes, high cholesterol, or kidney disease. Stress. Having a family history of high blood pressure and high cholesterol. Having obstructive sleep apnea. Age. The risk increases with age. What are the signs or symptoms? High blood pressure may not cause symptoms. Very high blood pressure (hypertensive crisis) may cause: Headache. Fast or irregular heartbeats (palpitations). Shortness of breath. Nosebleed. Nausea and vomiting. Vision changes. Severe chest pain, dizziness, and seizures. How is this diagnosed? This condition is diagnosed by  measuring your blood pressure while you are seated, with your arm resting on a flat surface, your legs uncrossed, and your feet flat on the floor. The cuff of the blood pressure monitor will be placed directly against the skin of your upper arm at the level of your heart. Blood pressure should be measured at least twice using the same arm. Certain conditions can cause a difference in blood pressure between your right and left arms. If you have a high blood pressure reading during one visit or you have normal blood pressure with other risk factors, you may be asked to: Return on a different day to have your blood pressure checked again. Monitor your blood pressure at home for 1 week or longer. If you are diagnosed with hypertension, you may have other blood or imaging tests to help your health care provider understand your overall risk for other conditions. How is this treated? This condition is treated by making healthy lifestyle changes, such as eating healthy foods, exercising more, and reducing your alcohol intake. You may be referred for counseling on a healthy diet and physical activity. Your health care provider may prescribe medicine if lifestyle changes are not enough to get your blood pressure under control and if: Your systolic blood pressure is above 130. Your diastolic blood pressure is above 80. Your personal target blood pressure may vary depending on your medical conditions, your age, and other factors. Follow these instructions at home: Eating and drinking  Eat a diet that is high in fiber and potassium, and low in sodium, added sugar, and fat. An example of this eating plan is called the DASH diet. DASH stands for Dietary Approaches to Stop Hypertension. To eat this way: Eat   plenty of fresh fruits and vegetables. Try to fill one half of your plate at each meal with fruits and vegetables. Eat whole grains, such as whole-wheat pasta, brown rice, or whole-grain bread. Fill about one  fourth of your plate with whole grains. Eat or drink low-fat dairy products, such as skim milk or low-fat yogurt. Avoid fatty cuts of meat, processed or cured meats, and poultry with skin. Fill about one fourth of your plate with lean proteins, such as fish, chicken without skin, beans, eggs, or tofu. Avoid pre-made and processed foods. These tend to be higher in sodium, added sugar, and fat. Reduce your daily sodium intake. Many people with hypertension should eat less than 1,500 mg of sodium a day. Do not drink alcohol if: Your health care provider tells you not to drink. You are pregnant, may be pregnant, or are planning to become pregnant. If you drink alcohol: Limit how much you have to: 0-1 drink a day for women. 0-2 drinks a day for men. Know how much alcohol is in your drink. In the [place] one drink equals one 12 oz bottle of beer (355 mL), one 5 oz glass of wine (148 mL), or one 1 oz glass of hard liquor (44 mL). Lifestyle  Work with your health care provider to maintain a healthy body weight or to lose weight. Ask what an ideal weight is for you. Get at least 30 minutes of exercise that causes your heart to beat faster (aerobic exercise) most days of the week. Activities may include walking, swimming, or biking. Include exercise to strengthen your muscles (resistance exercise), such as Pilates or lifting weights, as part of your weekly exercise routine. Try to do these types of exercises for 30 minutes at least 3 days a week. Do not use any products that contain nicotine or tobacco. These products include cigarettes, chewing tobacco, and vaping devices, such as e-cigarettes. If you need help quitting, ask your health care provider. Monitor your blood pressure at home as told by your health care provider. Keep all follow-up visits. This is important. Medicines Take over-the-counter and prescription medicines only as told by your health care provider. Follow directions carefully. Blood  pressure medicines must be taken as prescribed. Do not skip doses of blood pressure medicine. Doing this puts you at risk for problems and can make the medicine less effective. Ask your health care provider about side effects or reactions to medicines that you should watch for. Contact a health care provider if you: Think you are having a reaction to a medicine you are taking. Have headaches that keep coming back (recurring). Feel dizzy. Have swelling in your ankles. Have trouble with your vision. Get help right away if you: Develop a severe headache or confusion. Have unusual weakness or numbness. Feel faint. Have severe pain in your chest or abdomen. Vomit repeatedly. Have trouble breathing. These symptoms may be an emergency. Get help right away. Call 911. Do not wait to see if the symptoms will go away. Do not drive yourself to the hospital. Summary Hypertension is when the force of blood pumping through your arteries is too strong. If this condition is not controlled, it may put you at risk for serious complications. Your personal target blood pressure may vary depending on your medical conditions, your age, and other factors. For most people, a normal blood pressure is less than 120/80. Hypertension is treated with lifestyle changes, medicines, or a combination of both. Lifestyle changes include losing weight, eating a healthy,   low-sodium diet, exercising more, and limiting alcohol. This information is not intended to replace advice given to you by your health care provider. Make sure you discuss any questions you have with your health care provider. Document Revised: 02/13/2021 Document Reviewed: 02/13/2021 Elsevier Patient Education  2023 Elsevier Inc.  

## 2022-06-06 NOTE — Assessment & Plan Note (Signed)
Clinically euthyroid.  TSH done today. Continue Synthroid 50 mcg daily.

## 2022-06-07 LAB — COMPREHENSIVE METABOLIC PANEL
ALT: 9 U/L (ref 0–35)
AST: 14 U/L (ref 0–37)
Albumin: 4.5 g/dL (ref 3.5–5.2)
Alkaline Phosphatase: 58 U/L (ref 39–117)
BUN: 10 mg/dL (ref 6–23)
CO2: 24 mEq/L (ref 19–32)
Calcium: 9.6 mg/dL (ref 8.4–10.5)
Chloride: 101 mEq/L (ref 96–112)
Creatinine, Ser: 0.66 mg/dL (ref 0.40–1.20)
GFR: 105.02 mL/min (ref >60.00)
Glucose, Bld: 109 mg/dL — ABNORMAL HIGH (ref 70–99)
Potassium: 3.6 mEq/L (ref 3.5–5.1)
Sodium: 136 mEq/L (ref 135–145)
Total Bilirubin: 0.3 mg/dL (ref 0.2–1.2)
Total Protein: 7.7 g/dL (ref 6.0–8.3)

## 2022-06-07 LAB — LIPID PANEL
Cholesterol: 217 mg/dL — ABNORMAL HIGH (ref 0–200)
HDL: 46.8 mg/dL (ref >39.00)
NonHDL: 170.05
Total CHOL/HDL Ratio: 5
Triglycerides: 207 mg/dL — ABNORMAL HIGH (ref 0.0–149.0)
VLDL: 41.4 mg/dL — ABNORMAL HIGH (ref 0.0–40.0)

## 2022-06-07 LAB — LDL CHOLESTEROL, DIRECT: Direct LDL: 145 mg/dL

## 2022-07-12 ENCOUNTER — Other Ambulatory Visit: Payer: Self-pay | Admitting: Emergency Medicine

## 2022-07-12 DIAGNOSIS — I10 Essential (primary) hypertension: Secondary | ICD-10-CM

## 2022-09-10 ENCOUNTER — Other Ambulatory Visit: Payer: Self-pay | Admitting: Emergency Medicine

## 2022-09-10 DIAGNOSIS — E039 Hypothyroidism, unspecified: Secondary | ICD-10-CM

## 2023-01-11 ENCOUNTER — Other Ambulatory Visit: Payer: Self-pay | Admitting: Emergency Medicine

## 2023-01-11 DIAGNOSIS — I10 Essential (primary) hypertension: Secondary | ICD-10-CM

## 2023-02-06 LAB — HM MAMMOGRAPHY

## 2023-02-12 ENCOUNTER — Ambulatory Visit: Payer: 59 | Admitting: Obstetrics and Gynecology

## 2023-02-12 DIAGNOSIS — Z01419 Encounter for gynecological examination (general) (routine) without abnormal findings: Secondary | ICD-10-CM | POA: Insufficient documentation

## 2023-02-12 NOTE — Progress Notes (Signed)
No show

## 2023-02-12 NOTE — Assessment & Plan Note (Signed)
Cervical cancer screening performed according to ASCCP guidelines. Encouraged annual mammogram screening Colonoscopy UTD DXA not indicated Labs and immunizations with her primary Encouraged safe sexual practices as indicated Encouraged healthy lifestyle practices with diet and exercise For patients under 47yo, I recommend 1000mg  calcium daily and 600IU of vitamin D daily.

## 2023-03-31 ENCOUNTER — Encounter: Payer: Self-pay | Admitting: Obstetrics and Gynecology

## 2023-03-31 ENCOUNTER — Ambulatory Visit (INDEPENDENT_AMBULATORY_CARE_PROVIDER_SITE_OTHER): Payer: 59 | Admitting: Obstetrics and Gynecology

## 2023-03-31 VITALS — BP 132/76 | HR 91 | Ht 63.39 in | Wt 207.0 lb

## 2023-03-31 DIAGNOSIS — N946 Dysmenorrhea, unspecified: Secondary | ICD-10-CM

## 2023-03-31 DIAGNOSIS — D0511 Intraductal carcinoma in situ of right breast: Secondary | ICD-10-CM | POA: Diagnosis not present

## 2023-03-31 DIAGNOSIS — Z01419 Encounter for gynecological examination (general) (routine) without abnormal findings: Secondary | ICD-10-CM | POA: Diagnosis not present

## 2023-03-31 MED ORDER — NAPROXEN 500 MG PO TABS
500.0000 mg | ORAL_TABLET | Freq: Two times a day (BID) | ORAL | 3 refills | Status: DC
Start: 2023-03-31 — End: 2024-01-14

## 2023-03-31 NOTE — Assessment & Plan Note (Signed)
 Cervical cancer screening performed according to ASCCP guidelines. Encouraged annual mammogram screening Colonoscopy UTD DXA N/A Labs and immunizations with her primary Encouraged safe sexual practices as indicated Encouraged healthy lifestyle practices with diet and exercise For patients under 47yo, I recommend 1000mg  calcium daily and 600IU of vitamin D daily.

## 2023-03-31 NOTE — Assessment & Plan Note (Addendum)
Normal exam, MMG UTD

## 2023-03-31 NOTE — Progress Notes (Signed)
47 y.o. G0P0000 female with history of DCIS (dx'd 11/22, s/p R lumpectomy>RT) and dysmenorrhea here for annual exam.   Patient's last menstrual period was 03/17/2023 (approximate). Period Duration (Days): 8 Period Pattern: Regular Menstrual Flow: Moderate Menstrual Control: Tampon, Panty liner Dysmenorrhea: (!) Severe Dysmenorrhea Symptoms: Cramping, Headache  Abnormal bleeding: notes that cycles are becoming longer, now 8-9d, last 4d are mostly spotting Pelvic discharge or pain: none Breast mass, nipple discharge or skin changes : dryness of right breast after RT Birth control: condoms Last PAP:     Component Value Date/Time   DIAGPAP  01/23/2021 1226    - Negative for intraepithelial lesion or malignancy (NILM)   HPVHIGH Negative 01/23/2021 1226   ADEQPAP  01/23/2021 1226    Satisfactory for evaluation; transformation zone component PRESENT.   Last mammogram: 02/06/23 BIRADS 2, density d Last colonoscopy: 08/07/21, + polyp, f/u 7 years.   Sexually active: yes  Exercising: no, recently got a gyn membership Smoker: no  GYN HISTORY: DCIS, right, 2022  OB History  Gravida Para Term Preterm AB Living  0 0 0 0 0 0  SAB IAB Ectopic Multiple Live Births  0 0 0 0 0    Past Medical History:  Diagnosis Date   Anxiety    not on meds-hx of   Breast cancer (HCC)    pre cancerous cells-radiation   Family history of adverse reaction to anesthesia    mother hard time waking up after surgery   High blood pressure    on meds   History of radiation therapy    right breast 05/21/2021-06/15/2021  Dr Antony Blackbird   Hyperlipidemia    on meds   Thyroid disease    on meds    Past Surgical History:  Procedure Laterality Date   BREAST LUMPECTOMY WITH RADIOACTIVE SEED LOCALIZATION Right 04/09/2021   Procedure: RIGHT BREAST LUMPECTOMY WITH RADIOACTIVE SEED LOCALIZATION;  Surgeon: Emelia Loron, MD;  Location: [facility];  Service: General;  Laterality: Right;    EYE SURGERY Right 2009   Tightened muscle of eye    Current Outpatient Medications on File Prior to Visit  Medication Sig Dispense Refill   amLODipine (NORVASC) 10 MG tablet Take 1 tablet (10 mg total) by mouth daily. 90 tablet 3   atenolol (TENORMIN) 50 MG tablet TAKE 1 TABLET BY MOUTH EVERY DAY 90 tablet 1   ezetimibe (ZETIA) 10 MG tablet Take 1 tablet (10 mg total) by mouth daily. 90 tablet 3   irbesartan (AVAPRO) 150 MG tablet Take 1 tablet (150 mg total) by mouth daily. 90 tablet 3   levothyroxine (SYNTHROID) 50 MCG tablet TAKE 1 TABLET(50 MCG) BY MOUTH DAILY 90 tablet 3   No current facility-administered medications on file prior to visit.    Social History   Socioeconomic History   Marital status: Widowed    Spouse name: Not on file   Number of children: 0   Years of education: Not on file   Highest education level: Not on file  Occupational History   Occupation: customer service    Employer: [employer]  Tobacco Use   Smoking status: Never   Smokeless tobacco: Never  Vaping Use   Vaping status: Never Used  Substance and Sexual Activity   Alcohol use: Yes    Alcohol/week: 7.0 standard drinks of alcohol    Types: 7 Standard drinks or equivalent per week    Comment: 1 beer daily   Drug use: Yes  Frequency: 3.0 times per week    Types: Marijuana   Sexual activity: Yes    Partners: Male    Birth control/protection: None  Other Topics Concern   Not on file  Social History Narrative   Single without children   Working from home   Exercise - zumba once a week, fitness workout on Mondays   Diet - trying to be good, eating no salt.          As of 11/08/15:      Diet: N/A   Caffeine: No Sodas   Married: Engaged   House: Lives in apartment, 2 stories, 1 person    Pets: None   Current/Past profession: Data Entry    Exercise: No    Living Will: No    DNR: No    POA/HPOA: No       Social Determinants of Corporate investment banker Strain: Low Risk   (03/07/2021)   Overall Financial Resource Strain (CARDIA)    Difficulty of Paying Living Expenses: Not very hard  Food Insecurity: No Food Insecurity (03/07/2021)   Hunger Vital Sign    Worried About Running Out of Food in the Last Year: Never true    Ran Out of Food in the Last Year: Never true  Transportation Needs: No Transportation Needs (03/07/2021)   PRAPARE - Administrator, Civil Service (Medical): No    Lack of Transportation (Non-Medical): No  Physical Activity: Not on file  Stress: Not on file  Social Connections: Not on file  Intimate Partner Violence: Not on file    Family History  Problem Relation Age of Onset   Hypertension Mother    Stroke Mother    Thyroid disease Sister    Throat cancer Maternal Aunt 16   Lung cancer Maternal Uncle        dx after 28; x2 maternal uncles   Bone cancer Maternal Grandmother        dx after 61   Colon polyps Neg Hx    Colon cancer Neg Hx    Esophageal cancer Neg Hx    Stomach cancer Neg Hx    Rectal cancer Neg Hx     No Known Allergies    PE Today's Vitals   03/31/23 1329  BP: 132/76  Pulse: 91  SpO2: 99%  Weight: 207 lb (93.9 kg)  Height: 5' 3.39" (1.61 m)   Body mass index is 36.22 kg/m.  Physical Exam Vitals reviewed. Exam conducted with a chaperone present.  Constitutional:      General: She is not in acute distress.    Appearance: Normal appearance.  HENT:     Head: Normocephalic and atraumatic.     Nose: Nose normal.  Eyes:     Extraocular Movements: Extraocular movements intact.     Conjunctiva/sclera: Conjunctivae normal.  Neck:     Thyroid: No thyroid mass, thyromegaly or thyroid tenderness.  Pulmonary:     Effort: Pulmonary effort is normal.  Chest:     Chest wall: No mass or tenderness.  Breasts:    Right: Normal. No swelling, mass, nipple discharge, skin change or tenderness.     Left: Normal. No swelling, mass, nipple discharge, skin change or tenderness.       Comments:  Right lumpectomy scar Abdominal:     General: There is no distension.     Palpations: Abdomen is soft.     Tenderness: There is no abdominal tenderness.  Genitourinary:    General:  Normal vulva.     Exam position: Lithotomy position.     Urethra: No prolapse.     Vagina: Normal. No vaginal discharge or bleeding.     Cervix: Normal. No lesion.     Uterus: Normal. Not enlarged and not tender.      Adnexa: Right adnexa normal and left adnexa normal.  Musculoskeletal:        General: Normal range of motion.     Cervical back: Normal range of motion.  Lymphadenopathy:     Upper Body:     Right upper body: No axillary adenopathy.     Left upper body: No axillary adenopathy.     Lower Body: No right inguinal adenopathy. No left inguinal adenopathy.  Skin:    General: Skin is warm and dry.  Neurological:     General: No focal deficit present.     Mental Status: She is alert.  Psychiatric:        Mood and Affect: Mood normal.        Behavior: Behavior normal.       Assessment and Plan:        Well woman exam with routine gynecological exam Assessment & Plan: Cervical cancer screening performed according to ASCCP guidelines. Encouraged annual mammogram screening Colonoscopy UTD DXA N/A Labs and immunizations with her primary Encouraged safe sexual practices as indicated Encouraged healthy lifestyle practices with diet and exercise For patients under 50yo, I recommend 1000mg  calcium daily and 600IU of vitamin D daily.    Dysmenorrhea -     Naproxen; Take 1 tablet (500 mg total) by mouth 2 (two) times daily with a meal. Start 2 days before your cycle. Take up to 5d per cycle.  Dispense: 30 tablet; Refill: 3  Ductal carcinoma in situ (DCIS) of right breast Assessment & Plan: Normal exam, MMG UTD     Rosalyn Gess, MD

## 2023-03-31 NOTE — Patient Instructions (Signed)

## 2023-05-05 ENCOUNTER — Other Ambulatory Visit: Payer: Self-pay | Admitting: Emergency Medicine

## 2023-05-05 DIAGNOSIS — E785 Hyperlipidemia, unspecified: Secondary | ICD-10-CM

## 2023-05-05 DIAGNOSIS — I10 Essential (primary) hypertension: Secondary | ICD-10-CM

## 2023-07-04 ENCOUNTER — Other Ambulatory Visit: Payer: Self-pay | Admitting: Emergency Medicine

## 2023-07-04 DIAGNOSIS — I10 Essential (primary) hypertension: Secondary | ICD-10-CM

## 2023-09-10 ENCOUNTER — Other Ambulatory Visit: Payer: Self-pay | Admitting: Emergency Medicine

## 2023-09-10 DIAGNOSIS — E039 Hypothyroidism, unspecified: Secondary | ICD-10-CM

## 2023-12-10 ENCOUNTER — Telehealth: Payer: Self-pay | Admitting: *Deleted

## 2023-12-10 NOTE — Telephone Encounter (Signed)
 Spoke with patient, scheduled for OV 8/21 for new vaginal odor. Menses started 8/19, flow is heavy. Denies any other symptoms. Odor is string enough for partner to notice. Patient is asking if she should reschedule after menses?   Dr. Dallie -please advise.

## 2023-12-10 NOTE — Telephone Encounter (Signed)
 Patient left message on triage line stating she is scheduled for OV on 8/21, menses has started, ok to keep OV?  Call returned to patient, no answer, voicemail not set up.

## 2023-12-10 NOTE — Telephone Encounter (Signed)
 Call placed to patient, no answer, voicemail not set up.   MyChart message to patient.

## 2023-12-11 ENCOUNTER — Ambulatory Visit: Admitting: Obstetrics and Gynecology

## 2023-12-11 NOTE — Telephone Encounter (Signed)
 MyChart message viewed, encounter closed.

## 2023-12-19 ENCOUNTER — Encounter: Payer: Self-pay | Admitting: Obstetrics and Gynecology

## 2023-12-19 ENCOUNTER — Ambulatory Visit (INDEPENDENT_AMBULATORY_CARE_PROVIDER_SITE_OTHER): Admitting: Obstetrics and Gynecology

## 2023-12-19 ENCOUNTER — Ambulatory Visit: Payer: Self-pay | Admitting: Obstetrics and Gynecology

## 2023-12-19 VITALS — BP 112/70 | HR 88 | Temp 98.0°F | Wt 214.0 lb

## 2023-12-19 DIAGNOSIS — N760 Acute vaginitis: Secondary | ICD-10-CM

## 2023-12-19 DIAGNOSIS — B9689 Other specified bacterial agents as the cause of diseases classified elsewhere: Secondary | ICD-10-CM

## 2023-12-19 DIAGNOSIS — N898 Other specified noninflammatory disorders of vagina: Secondary | ICD-10-CM

## 2023-12-19 LAB — WET PREP FOR TRICH, YEAST, CLUE

## 2023-12-19 MED ORDER — METRONIDAZOLE 500 MG PO TABS
500.0000 mg | ORAL_TABLET | Freq: Two times a day (BID) | ORAL | 0 refills | Status: AC
Start: 2023-12-19 — End: 2023-12-26

## 2023-12-19 NOTE — Progress Notes (Signed)
 48 y.o. G0P0000 female with history of DCIS (dx'd 11/22, s/p R lumpectomy>RT) and dysmenorrhea here for vaginal complaints. Widowed, boyfriend x 2 years.  Patient's last menstrual period was 12/10/2023 (exact date).   She reports noticed an odor after having IC x 4 weeks. Some vaginal discharge. Notices odor after IC every time.  GYN HISTORY: DCIS, right, 2022   OB History  Gravida Para Term Preterm AB Living  0 0 0 0 0 0  SAB IAB Ectopic Multiple Live Births  0 0 0 0 0   Past Medical History:  Diagnosis Date   Anxiety    not on meds-hx of   Breast cancer (HCC)    pre cancerous cells-radiation   Family history of adverse reaction to anesthesia    mother hard time waking up after surgery   High blood pressure    on meds   History of radiation therapy    right breast 05/21/2021-06/15/2021  Dr Lynwood Nasuti   Hyperlipidemia    on meds   Thyroid  disease    on meds   Past Surgical History:  Procedure Laterality Date   BREAST LUMPECTOMY WITH RADIOACTIVE SEED LOCALIZATION Right 04/09/2021   Procedure: RIGHT BREAST LUMPECTOMY WITH RADIOACTIVE SEED LOCALIZATION;  Surgeon: Ebbie Cough, MD;  Location: [facility];  Service: General;  Laterality: Right;   EYE SURGERY Right 2009   Tightened muscle of eye   Current Outpatient Medications on File Prior to Visit  Medication Sig Dispense Refill   amLODipine  (NORVASC ) 10 MG tablet TAKE 1 TABLET BY MOUTH DAILY 90 tablet 3   atenolol  (TENORMIN ) 50 MG tablet TAKE 1 TABLET BY MOUTH EVERY DAY 90 tablet 1   ezetimibe  (ZETIA ) 10 MG tablet TAKE 1 TABLET BY MOUTH DAILY 90 tablet 3   irbesartan  (AVAPRO ) 150 MG tablet TAKE 1 TABLET BY MOUTH DAILY 90 tablet 3   levothyroxine  (SYNTHROID ) 50 MCG tablet TAKE 1 TABLET(50 MCG) BY MOUTH DAILY 90 tablet 3   naproxen  (NAPROSYN ) 500 MG tablet Take 1 tablet (500 mg total) by mouth 2 (two) times daily with a meal. Start 2 days before your cycle. Take up to 5d per cycle. 30 tablet 3   No  current facility-administered medications on file prior to visit.   No Known Allergies    PE Today's Vitals   12/19/23 0857  BP: 112/70  Pulse: 88  Temp: 98 F (36.7 C)  TempSrc: Oral  SpO2: 98%  Weight: 214 lb (97.1 kg)   Body mass index is 37.45 kg/m.  Physical Exam Vitals reviewed. Exam conducted with a chaperone present.  Constitutional:      General: She is not in acute distress.    Appearance: Normal appearance.  HENT:     Head: Normocephalic and atraumatic.     Nose: Nose normal.  Eyes:     Extraocular Movements: Extraocular movements intact.     Conjunctiva/sclera: Conjunctivae normal.  Pulmonary:     Effort: Pulmonary effort is normal.  Genitourinary:    General: Normal vulva.     Exam position: Lithotomy position.     Vagina: Vaginal discharge present.     Cervix: Normal. No cervical motion tenderness, discharge or lesion.     Uterus: Normal. Not enlarged and not tender.      Adnexa: Right adnexa normal and left adnexa normal.  Musculoskeletal:        General: Normal range of motion.     Cervical back: Normal range of motion.  Neurological:  General: No focal deficit present.     Mental Status: She is alert.  Psychiatric:        Mood and Affect: Mood normal.        Behavior: Behavior normal.       Assessment and Plan:        Vaginal discharge -     WET PREP FOR TRICH, YEAST, CLUE  BV (bacterial vaginosis) -     metroNIDAZOLE ; Take 1 tablet (500 mg total) by mouth 2 (two) times daily for 7 days.  Dispense: 14 tablet; Refill: 0    Vera LULLA Pa, MD

## 2024-01-14 ENCOUNTER — Other Ambulatory Visit: Payer: Self-pay

## 2024-01-14 DIAGNOSIS — N946 Dysmenorrhea, unspecified: Secondary | ICD-10-CM

## 2024-01-14 NOTE — Telephone Encounter (Signed)
 Med refill request:  Naproxen  sodium 550 mg tablets  Start: 03/31/23 - Disp: #30 tablet with 3 refills  Last AEX:    03/31/23 Next AEX:  Not yet scheduled  Refill authorized?  Please Advise.

## 2024-01-15 MED ORDER — NAPROXEN 500 MG PO TABS: 500.0000 mg | ORAL_TABLET | Freq: Two times a day (BID) | ORAL | 3 refills | Status: DC

## 2024-01-19 ENCOUNTER — Other Ambulatory Visit: Payer: Self-pay

## 2024-01-19 DIAGNOSIS — N946 Dysmenorrhea, unspecified: Secondary | ICD-10-CM

## 2024-01-19 MED ORDER — NAPROXEN 500 MG PO TABS: 500.0000 mg | ORAL_TABLET | Freq: Two times a day (BID) | ORAL | 3 refills | Status: AC

## 2024-01-19 NOTE — Telephone Encounter (Signed)
 Med refill request:  naproxen  (naprosyn ) 500 mg tablets Starting: 01/15/24 Disp: #30 tablets with 3 refills    Last AEX:  03/31/23 Next AEX:  Not scheduled yet  Refill authorized? Please Advise.

## 2024-02-08 ENCOUNTER — Other Ambulatory Visit: Payer: Self-pay | Admitting: Emergency Medicine

## 2024-02-08 DIAGNOSIS — I10 Essential (primary) hypertension: Secondary | ICD-10-CM

## 2024-02-09 LAB — HM MAMMOGRAPHY

## 2024-02-13 ENCOUNTER — Ambulatory Visit: Payer: Self-pay | Admitting: Emergency Medicine

## 2024-02-13 ENCOUNTER — Encounter: Payer: Self-pay | Admitting: Emergency Medicine

## 2024-02-20 ENCOUNTER — Encounter: Payer: Self-pay | Admitting: Emergency Medicine

## 2024-05-27 ENCOUNTER — Ambulatory Visit: Admitting: Emergency Medicine

## 2024-05-27 ENCOUNTER — Encounter: Payer: Self-pay | Admitting: Emergency Medicine

## 2024-05-27 VITALS — BP 126/82 | HR 93 | Temp 98.4°F | Ht 63.39 in | Wt 211.0 lb

## 2024-05-27 DIAGNOSIS — E039 Hypothyroidism, unspecified: Secondary | ICD-10-CM

## 2024-05-27 DIAGNOSIS — E785 Hyperlipidemia, unspecified: Secondary | ICD-10-CM

## 2024-05-27 DIAGNOSIS — R7303 Prediabetes: Secondary | ICD-10-CM

## 2024-05-27 DIAGNOSIS — N951 Menopausal and female climacteric states: Secondary | ICD-10-CM | POA: Insufficient documentation

## 2024-05-27 DIAGNOSIS — Z6836 Body mass index (BMI) 36.0-36.9, adult: Secondary | ICD-10-CM | POA: Insufficient documentation

## 2024-05-27 DIAGNOSIS — I10 Essential (primary) hypertension: Secondary | ICD-10-CM

## 2024-05-27 LAB — LIPID PANEL
Cholesterol: 242 mg/dL — ABNORMAL HIGH (ref 28–200)
HDL: 53.5 mg/dL
LDL Cholesterol: 130 mg/dL — ABNORMAL HIGH (ref 10–99)
NonHDL: 188.02
Total CHOL/HDL Ratio: 5
Triglycerides: 292 mg/dL — ABNORMAL HIGH (ref 10.0–149.0)
VLDL: 58.4 mg/dL — ABNORMAL HIGH (ref 0.0–40.0)

## 2024-05-27 LAB — HEMOGLOBIN A1C: Hgb A1c MFr Bld: 6.9 % — ABNORMAL HIGH (ref 4.6–6.5)

## 2024-05-27 LAB — COMPREHENSIVE METABOLIC PANEL WITH GFR
ALT: 22 U/L (ref 3–35)
AST: 25 U/L (ref 5–37)
Albumin: 4.5 g/dL (ref 3.5–5.2)
Alkaline Phosphatase: 73 U/L (ref 39–117)
BUN: 8 mg/dL (ref 6–23)
CO2: 31 meq/L (ref 19–32)
Calcium: 9.6 mg/dL (ref 8.4–10.5)
Chloride: 97 meq/L (ref 96–112)
Creatinine, Ser: 0.61 mg/dL (ref 0.40–1.20)
GFR: 105.56 mL/min
Glucose, Bld: 153 mg/dL — ABNORMAL HIGH (ref 70–99)
Potassium: 3.8 meq/L (ref 3.5–5.1)
Sodium: 135 meq/L (ref 135–145)
Total Bilirubin: 0.5 mg/dL (ref 0.2–1.2)
Total Protein: 8.1 g/dL (ref 6.0–8.3)

## 2024-05-27 LAB — CBC
HCT: 36.6 % (ref 36.0–46.0)
Hemoglobin: 12.6 g/dL (ref 12.0–15.0)
MCHC: 34.5 g/dL (ref 30.0–36.0)
MCV: 92.7 fl (ref 78.0–100.0)
Platelets: 316 10*3/uL (ref 150.0–400.0)
RBC: 3.95 Mil/uL (ref 3.87–5.11)
RDW: 12.4 % (ref 11.5–15.5)
WBC: 5.4 10*3/uL (ref 4.0–10.5)

## 2024-05-27 LAB — VITAMIN B12: Vitamin B-12: 350 pg/mL (ref 211–911)

## 2024-05-27 LAB — TSH: TSH: 1.87 u[IU]/mL (ref 0.35–5.50)

## 2024-05-27 LAB — VITAMIN D 25 HYDROXY (VIT D DEFICIENCY, FRACTURES): VITD: <7 ng/mL — ABNORMAL LOW (ref 30.00–100.00)

## 2024-05-27 MED ORDER — WEGOVY 0.25 MG/0.5ML ~~LOC~~ SOAJ: 0.2500 mg | SUBCUTANEOUS | 1 refills | Status: AC

## 2024-05-27 NOTE — Patient Instructions (Signed)
 Health Maintenance, Female Adopting a healthy lifestyle and getting preventive care are important in promoting health and wellness. Ask your health care provider about: The right schedule for you to have regular tests and exams. Things you can do on your own to prevent diseases and keep yourself healthy. What should I know about diet, weight, and exercise? Eat a healthy diet  Eat a diet that includes plenty of vegetables, fruits, low-fat dairy products, and lean protein. Do not eat a lot of foods that are high in solid fats, added sugars, or sodium. Maintain a healthy weight Body mass index (BMI) is used to identify weight problems. It estimates body fat based on height and weight. Your health care provider can help determine your BMI and help you achieve or maintain a healthy weight. Get regular exercise Get regular exercise. This is one of the most important things you can do for your health. Most adults should: Exercise for at least 150 minutes each week. The exercise should increase your heart rate and make you sweat (moderate-intensity exercise). Do strengthening exercises at least twice a week. This is in addition to the moderate-intensity exercise. Spend less time sitting. Even light physical activity can be beneficial. Watch cholesterol and blood lipids Have your blood tested for lipids and cholesterol at 49 years of age, then have this test every 5 years. Have your cholesterol levels checked more often if: Your lipid or cholesterol levels are high. You are older than 49 years of age. You are at high risk for heart disease. What should I know about cancer screening? Depending on your health history and family history, you may need to have cancer screening at various ages. This may include screening for: Breast cancer. Cervical cancer. Colorectal cancer. Skin cancer. Lung cancer. What should I know about heart disease, diabetes, and high blood pressure? Blood pressure and heart  disease High blood pressure causes heart disease and increases the risk of stroke. This is more likely to develop in people who have high blood pressure readings or are overweight. Have your blood pressure checked: Every 3-5 years if you are 32-37 years of age. Every year if you are 71 years old or older. Diabetes Have regular diabetes screenings. This checks your fasting blood sugar level. Have the screening done: Once every three years after age 24 if you are at a normal weight and have a low risk for diabetes. More often and at a younger age if you are overweight or have a high risk for diabetes. What should I know about preventing infection? Hepatitis B If you have a higher risk for hepatitis B, you should be screened for this virus. Talk with your health care provider to find out if you are at risk for hepatitis B infection. Hepatitis C Testing is recommended for: Everyone born from [birth year] through 1965. Anyone with known risk factors for hepatitis C. Sexually transmitted infections (STIs) Get screened for STIs, including gonorrhea and chlamydia, if: You are sexually active and are younger than 49 years of age. You are older than 49 years of age and your health care provider tells you that you are at risk for this type of infection. Your sexual activity has changed since you were last screened, and you are at increased risk for chlamydia or gonorrhea. Ask your health care provider if you are at risk. Ask your health care provider about whether you are at high risk for HIV. Your health care provider may recommend a prescription medicine to help prevent HIV  infection. If you choose to take medicine to prevent HIV, you should first get tested for HIV. You should then be tested every 3 months for as long as you are taking the medicine. Pregnancy If you are about to stop having your period (premenopausal) and you may become pregnant, seek counseling before you get pregnant. Take 400 to 800  micrograms (mcg) of folic acid every day if you become pregnant. Ask for birth control (contraception) if you want to prevent pregnancy. Osteoporosis and menopause Osteoporosis is a disease in which the bones lose minerals and strength with aging. This can result in bone fractures. If you are 42 years old or older, or if you are at risk for osteoporosis and fractures, ask your health care provider if you should: Be screened for bone loss. Take a calcium  or vitamin D  supplement to lower your risk of fractures. Be given hormone replacement therapy (HRT) to treat symptoms of menopause. Follow these instructions at home: Alcohol use Do not drink alcohol if: Your health care provider tells you not to drink. You are pregnant, may be pregnant, or are planning to become pregnant. If you drink alcohol: Limit how much you have to: 0-1 drink a day. Know how much alcohol is in your drink. In the [place] one drink equals one 12 oz bottle of beer (355 mL), one 5 oz glass of wine (148 mL), or one 1 oz glass of hard liquor (44 mL). Lifestyle Do not use any products that contain nicotine or tobacco. These products include cigarettes, chewing tobacco, and vaping devices, such as e-cigarettes. If you need help quitting, ask your health care provider. Do not use street drugs. Do not share needles. Ask your health care provider for help if you need support or information about quitting drugs. General instructions Schedule regular health, dental, and eye exams. Stay current with your vaccines. Tell your health care provider if: You often feel depressed. You have ever been abused or do not feel safe at home. This information is not intended to replace advice given to you by your health care provider. Make sure you discuss any questions you have with your health care provider. Document Revised: 10/15/2023 Document Reviewed: 08/28/2020 Elsevier Patient Education  2025 [employer].

## 2024-05-27 NOTE — Assessment & Plan Note (Signed)
 BP Readings from Last 3 Encounters:  05/27/24 126/82  12/19/23 112/70  03/31/23 132/76  Well-controlled hypertension. Continue irbesartan  150 mg daily and atenolol  50 mg daily and amlodipine  10 mg daily. Cardiovascular risk associated with hypertension discussed. Dietary approaches to stop hypertension discussed.

## 2024-05-27 NOTE — Assessment & Plan Note (Signed)
Clinically euthyroid. TSH done today Continue Synthroid 50 mcg daily.

## 2024-05-27 NOTE — Assessment & Plan Note (Signed)
 Diet and nutrition discussed Cardiovascular risks associated with obesity discussed Advised to decrease amount of daily carbohydrate intake and daily calories and increase amount of plant-based protein in her diet Will benefit from GLP-1 agonist Recommend weekly Wegovy 

## 2024-05-27 NOTE — Assessment & Plan Note (Signed)
 Stable and chronic.  Diet and nutrition discussed. Continue Zetia  10 mg daily.  Intolerant to statins.

## 2024-05-27 NOTE — Progress Notes (Signed)
 Ann Thornton 49 y.o.   Chief Complaint  Patient presents with   Nausea    Hot flashes, light headed but it comes and goes and this just recently started     HISTORY OF PRESENT ILLNESS: This is a 49 y.o. female complaining of intermittent bouts of nausea, hot flashes and lightheadedness for the past couple weeks No other associated symptoms No other complaints or medical concerns today.  HPI   Prior to Admission medications  Medication Sig Start Date End Date Taking? Authorizing Provider  amLODipine  (NORVASC ) 10 MG tablet TAKE 1 TABLET BY MOUTH DAILY 05/06/23  Yes Branden Vine, Emil Schanz, MD  atenolol  (TENORMIN ) 50 MG tablet TAKE 1 TABLET BY MOUTH EVERY DAY 02/08/24  Yes Altamese Deguire Jose, MD  ezetimibe  (ZETIA ) 10 MG tablet TAKE 1 TABLET BY MOUTH DAILY 05/06/23  Yes Harlow Basley Jose, MD  irbesartan  (AVAPRO ) 150 MG tablet TAKE 1 TABLET BY MOUTH DAILY 05/06/23  Yes Zona Pedro, Emil Schanz, MD  levothyroxine  (SYNTHROID ) 50 MCG tablet TAKE 1 TABLET(50 MCG) BY MOUTH DAILY 09/10/23  Yes Renu Asby, Emil Schanz, MD  naproxen  (NAPROSYN ) 500 MG tablet Take 1 tablet (500 mg total) by mouth 2 (two) times daily with a meal. Start 2 days before your cycle. Take up to 5d per cycle. 01/19/24  Yes Dallie Vera GAILS, MD    Allergies[1]  Patient Active Problem List   Diagnosis Date Noted   Ductal carcinoma in situ (DCIS) of right breast 03/02/2021   Hypothyroidism 08/07/2020   Dyslipidemia 12/08/2019   Prediabetes 12/08/2019   Abnormal thyroid  blood test 12/08/2019   Essential hypertension 03/23/2015   Anxiety disorder 06/02/2014    Past Medical History:  Diagnosis Date   Anxiety    not on meds-hx of   Breast cancer (HCC)    pre cancerous cells-radiation   Family history of adverse reaction to anesthesia    mother hard time waking up after surgery   High blood pressure    on meds   History of radiation therapy    right breast 05/21/2021-06/15/2021  Dr Lynwood Nasuti   Hyperlipidemia     on meds   Thyroid  disease    on meds    Past Surgical History:  Procedure Laterality Date   BREAST LUMPECTOMY WITH RADIOACTIVE SEED LOCALIZATION Right 04/09/2021   Procedure: RIGHT BREAST LUMPECTOMY WITH RADIOACTIVE SEED LOCALIZATION;  Surgeon: Ebbie Cough, MD;  Location: [facility];  Service: General;  Laterality: Right;   EYE SURGERY Right 2009   Tightened muscle of eye    Social History   Socioeconomic History   Marital status: Widowed    Spouse name: Not on file   Number of children: 0   Years of education: Not on file   Highest education level: Not on file  Occupational History   Occupation: customer service    Employer: [employer]  Tobacco Use   Smoking status: Never   Smokeless tobacco: Never  Vaping Use   Vaping status: Never Used  Substance and Sexual Activity   Alcohol use: Yes    Alcohol/week: 7.0 standard drinks of alcohol    Types: 7 Standard drinks or equivalent per week    Comment: 1 beer daily   Drug use: Yes    Frequency: 3.0 times per week    Types: Marijuana   Sexual activity: Yes    Partners: Male    Birth control/protection: None  Other Topics Concern   Not on file  Social History Narrative  Single without children   Working from home   Exercise - zumba once a week, fitness workout on Mondays   Diet - trying to be good, eating no salt.          As of 11/08/15:      Diet: N/A   Caffeine: No Sodas   Married: Engaged   House: Lives in apartment, 2 stories, 1 person    Pets: None   Current/Past profession: Data Entry    Exercise: No    Living Will: No    DNR: No    POA/HPOA: No       Social Drivers of Health   Tobacco Use: Low Risk (05/27/2024)   Patient History    Smoking Tobacco Use: Never    Smokeless Tobacco Use: Never    Passive Exposure: Not on file  Financial Resource Strain: Not on file  Food Insecurity: Not on file  Transportation Needs: Not on file  Physical Activity: Not on file   Stress: Not on file  Social Connections: Not on file  Intimate Partner Violence: Not on file  Depression (PHQ2-9): Low Risk (03/31/2023)   Depression (PHQ2-9)    PHQ-2 Score: 0  Alcohol Screen: Not on file  Housing: Not on file  Utilities: Not on file  Health Literacy: Not on file    Family History  Problem Relation Age of Onset   Hypertension Mother    Stroke Mother    Thyroid  disease Sister    Throat cancer Maternal Aunt 38   Lung cancer Maternal Uncle        dx after 12; x2 maternal uncles   Bone cancer Maternal Grandmother        dx after 50   Colon polyps Neg Hx    Colon cancer Neg Hx    Esophageal cancer Neg Hx    Stomach cancer Neg Hx    Rectal cancer Neg Hx      Review of Systems  Constitutional: Negative.  Negative for chills and fever.  HENT: Negative.  Negative for congestion and sore throat.   Respiratory: Negative.  Negative for cough and shortness of breath.   Cardiovascular: Negative.  Negative for chest pain and palpitations.  Gastrointestinal:  Positive for nausea.  Genitourinary: Negative.  Negative for dysuria and hematuria.  Musculoskeletal: Negative.   Skin: Negative.  Negative for rash.  Neurological: Negative.  Negative for dizziness and headaches.  All other systems reviewed and are negative.   Vitals:   05/27/24 1359  BP: 126/82  Pulse: 93  Temp: 98.4 F (36.9 C)  SpO2: 97%    Physical Exam Vitals reviewed.  Constitutional:      Appearance: Normal appearance. She is obese.  HENT:     Head: Normocephalic.     Mouth/Throat:     Mouth: Mucous membranes are moist.     Pharynx: Oropharynx is clear.  Eyes:     Extraocular Movements: Extraocular movements intact.     Conjunctiva/sclera: Conjunctivae normal.     Pupils: Pupils are equal, round, and reactive to light.  Cardiovascular:     Rate and Rhythm: Normal rate and regular rhythm.     Pulses: Normal pulses.     Heart sounds: Normal heart sounds.  Pulmonary:     Effort:  Pulmonary effort is normal.     Breath sounds: Normal breath sounds.  Abdominal:     Palpations: Abdomen is soft.     Tenderness: There is no abdominal tenderness.  Musculoskeletal:  Cervical back: No tenderness.  Lymphadenopathy:     Cervical: No cervical adenopathy.  Skin:    General: Skin is warm and dry.     Capillary Refill: Capillary refill takes less than 2 seconds.  Neurological:     General: No focal deficit present.     Mental Status: She is alert and oriented to person, place, and time.  Psychiatric:        Mood and Affect: Mood normal.        Behavior: Behavior normal.      ASSESSMENT & PLAN: A total of 40 minutes was spent with the patient and counseling/coordination of care regarding preparing for this visit, review of most recent office visit notes, review of multiple chronic medical conditions and their management, review of all medications, review of most recent bloodwork results, review of health maintenance items, education on nutrition, prognosis, documentation, and need for follow up.   Problem List Items Addressed This Visit       Cardiovascular and Mediastinum   Essential hypertension - Primary   BP Readings from Last 3 Encounters:  05/27/24 126/82  12/19/23 112/70  03/31/23 132/76  Well-controlled hypertension. Continue irbesartan  150 mg daily and atenolol  50 mg daily and amlodipine  10 mg daily. Cardiovascular risk associated with hypertension discussed. Dietary approaches to stop hypertension discussed.       Relevant Orders   CBC   Comprehensive metabolic panel with GFR     Endocrine   Hypothyroidism   Clinically euthyroid.  TSH done today. Continue Synthroid  50 mcg daily.      Relevant Orders   TSH     Other   Dyslipidemia   Stable and chronic.  Diet and nutrition discussed. Continue Zetia  10 mg daily.  Intolerant to statins.      Relevant Orders   CBC   Comprehensive metabolic panel with GFR   Lipid panel   Vitamin B12    VITAMIN D 25 Hydroxy (Vit-D Deficiency, Fractures)   Prediabetes   Diet and nutrition discussed.  Advised to decrease amount of daily carbohydrate intake and daily calories and increase amount of plant-based protein in her diet. Hemoglobin A1c done today.      Relevant Orders   Comprehensive metabolic panel with GFR   Hemoglobin A1c   Class 2 severe obesity due to excess calories with serious comorbidity and body mass index (BMI) of 36.0 to 36.9 in adult   Diet and nutrition discussed Cardiovascular risks associated with obesity discussed Advised to decrease amount of daily carbohydrate intake and daily calories and increase amount of plant-based protein in her diet Will benefit from GLP-1 agonist Recommend weekly Wegovy       Relevant Medications   semaglutide -weight management (WEGOVY ) 0.25 MG/0.5ML SOAJ SQ injection   Other Relevant Orders   Comprehensive metabolic panel with GFR   Lipid panel   Vitamin B12   VITAMIN D 25 Hydroxy (Vit-D Deficiency, Fractures)   Perimenopausal   Contributing to most of her symptoms Needs to follow-up with her gynecologist       Relevant Orders   CBC   Patient Instructions  Health Maintenance, Female Adopting a healthy lifestyle and getting preventive care are important in promoting health and wellness. Ask your health care provider about: The right schedule for you to have regular tests and exams. Things you can do on your own to prevent diseases and keep yourself healthy. What should I know about diet, weight, and exercise? Eat a healthy diet  Eat a diet that  includes plenty of vegetables, fruits, low-fat dairy products, and lean protein. Do not eat a lot of foods that are high in solid fats, added sugars, or sodium. Maintain a healthy weight Body mass index (BMI) is used to identify weight problems. It estimates body fat based on height and weight. Your health care provider can help determine your BMI and help you achieve or maintain a  healthy weight. Get regular exercise Get regular exercise. This is one of the most important things you can do for your health. Most adults should: Exercise for at least 150 minutes each week. The exercise should increase your heart rate and make you sweat (moderate-intensity exercise). Do strengthening exercises at least twice a week. This is in addition to the moderate-intensity exercise. Spend less time sitting. Even light physical activity can be beneficial. Watch cholesterol and blood lipids Have your blood tested for lipids and cholesterol at 49 years of age, then have this test every 5 years. Have your cholesterol levels checked more often if: Your lipid or cholesterol levels are high. You are older than 49 years of age. You are at high risk for heart disease. What should I know about cancer screening? Depending on your health history and family history, you may need to have cancer screening at various ages. This may include screening for: Breast cancer. Cervical cancer. Colorectal cancer. Skin cancer. Lung cancer. What should I know about heart disease, diabetes, and high blood pressure? Blood pressure and heart disease High blood pressure causes heart disease and increases the risk of stroke. This is more likely to develop in people who have high blood pressure readings or are overweight. Have your blood pressure checked: Every 3-5 years if you are 40-40 years of age. Every year if you are 83 years old or older. Diabetes Have regular diabetes screenings. This checks your fasting blood sugar level. Have the screening done: Once every three years after age 55 if you are at a normal weight and have a low risk for diabetes. More often and at a younger age if you are overweight or have a high risk for diabetes. What should I know about preventing infection? Hepatitis B If you have a higher risk for hepatitis B, you should be screened for this virus. Talk with your health care  provider to find out if you are at risk for hepatitis B infection. Hepatitis C Testing is recommended for: Everyone born from [birth year] through 1965. Anyone with known risk factors for hepatitis C. Sexually transmitted infections (STIs) Get screened for STIs, including gonorrhea and chlamydia, if: You are sexually active and are younger than 49 years of age. You are older than 49 years of age and your health care provider tells you that you are at risk for this type of infection. Your sexual activity has changed since you were last screened, and you are at increased risk for chlamydia or gonorrhea. Ask your health care provider if you are at risk. Ask your health care provider about whether you are at high risk for HIV. Your health care provider may recommend a prescription medicine to help prevent HIV infection. If you choose to take medicine to prevent HIV, you should first get tested for HIV. You should then be tested every 3 months for as long as you are taking the medicine. Pregnancy If you are about to stop having your period (premenopausal) and you may become pregnant, seek counseling before you get pregnant. Take 400 to 800 micrograms (mcg) of folic acid every  day if you become pregnant. Ask for birth control (contraception) if you want to prevent pregnancy. Osteoporosis and menopause Osteoporosis is a disease in which the bones lose minerals and strength with aging. This can result in bone fractures. If you are 46 years old or older, or if you are at risk for osteoporosis and fractures, ask your health care provider if you should: Be screened for bone loss. Take a calcium  or vitamin D supplement to lower your risk of fractures. Be given hormone replacement therapy (HRT) to treat symptoms of menopause. Follow these instructions at home: Alcohol use Do not drink alcohol if: Your health care provider tells you not to drink. You are pregnant, may be pregnant, or are planning to become  pregnant. If you drink alcohol: Limit how much you have to: 0-1 drink a day. Know how much alcohol is in your drink. In the [place] one drink equals one 12 oz bottle of beer (355 mL), one 5 oz glass of wine (148 mL), or one 1 oz glass of hard liquor (44 mL). Lifestyle Do not use any products that contain nicotine or tobacco. These products include cigarettes, chewing tobacco, and vaping devices, such as e-cigarettes. If you need help quitting, ask your health care provider. Do not use street drugs. Do not share needles. Ask your health care provider for help if you need support or information about quitting drugs. General instructions Schedule regular health, dental, and eye exams. Stay current with your vaccines. Tell your health care provider if: You often feel depressed. You have ever been abused or do not feel safe at home. This information is not intended to replace advice given to you by your health care provider. Make sure you discuss any questions you have with your health care provider. Document Revised: 10/15/2023 Document Reviewed: 08/28/2020 Elsevier Patient Education  2025 [employer]     Emil Schaumann, MD [facility]     [1] No Known Allergies

## 2024-05-27 NOTE — Assessment & Plan Note (Signed)
 Contributing to most of her symptoms Needs to follow-up with her gynecologist

## 2024-05-27 NOTE — Assessment & Plan Note (Signed)
Diet and nutrition discussed.  Advised to decrease amount of daily carbohydrate intake and daily calories and increase amount of plant-based protein in her diet. Hemoglobin A1c done today.

## 2024-05-28 ENCOUNTER — Telehealth: Payer: Self-pay

## 2024-05-28 ENCOUNTER — Other Ambulatory Visit (HOSPITAL_COMMUNITY): Payer: Self-pay

## 2024-05-28 NOTE — Telephone Encounter (Signed)
 Pharmacy Patient Advocate Encounter   Received notification from Pt Calls Messages that prior authorization for Ozempic (0.25 or 0.5 MG/DOSE) 2MG /3ML pen-injectors is required/requested.   Insurance verification completed.   The patient is insured through [facility] [facility]   Per test claim: PA required; PA submitted to above mentioned insurance via Latent Key/confirmation #/EOC [id number] Status is pending
